# Patient Record
Sex: Female | Born: 1935 | Race: White | Hispanic: No | Marital: Married | Smoking: Never smoker
Health system: Southern US, Community
[De-identification: ages and names within clinical notes are randomized; demographics above are authoritative.]

## PROBLEM LIST (undated history)

## (undated) DIAGNOSIS — I771 Stricture of artery: Secondary | ICD-10-CM

## (undated) DIAGNOSIS — G4733 Obstructive sleep apnea (adult) (pediatric): Secondary | ICD-10-CM

## (undated) DIAGNOSIS — I2510 Atherosclerotic heart disease of native coronary artery without angina pectoris: Secondary | ICD-10-CM

## (undated) DIAGNOSIS — I472 Ventricular tachycardia: Secondary | ICD-10-CM

## (undated) DIAGNOSIS — E785 Hyperlipidemia, unspecified: Secondary | ICD-10-CM

## (undated) DIAGNOSIS — I10 Essential (primary) hypertension: Secondary | ICD-10-CM

## (undated) DIAGNOSIS — Z8679 Personal history of other diseases of the circulatory system: Secondary | ICD-10-CM

## (undated) DIAGNOSIS — Z9289 Personal history of other medical treatment: Secondary | ICD-10-CM

## (undated) DIAGNOSIS — D649 Anemia, unspecified: Secondary | ICD-10-CM

## (undated) DIAGNOSIS — I4729 Other ventricular tachycardia: Secondary | ICD-10-CM

## (undated) DIAGNOSIS — E119 Type 2 diabetes mellitus without complications: Secondary | ICD-10-CM

## (undated) HISTORY — DX: Stricture of artery: I77.1

## (undated) HISTORY — DX: Personal history of other diseases of the circulatory system: Z86.79

## (undated) HISTORY — DX: Ventricular tachycardia: I47.2

## (undated) HISTORY — DX: Obstructive sleep apnea (adult) (pediatric): G47.33

## (undated) HISTORY — DX: Anemia, unspecified: D64.9

## (undated) HISTORY — DX: Morbid (severe) obesity due to excess calories: E66.01

## (undated) HISTORY — DX: Essential (primary) hypertension: I10

## (undated) HISTORY — DX: Hyperlipidemia, unspecified: E78.5

## (undated) HISTORY — DX: Type 2 diabetes mellitus without complications: E11.9

## (undated) HISTORY — DX: Other ventricular tachycardia: I47.29

## (undated) HISTORY — DX: Personal history of other medical treatment: Z92.89

## (undated) HISTORY — DX: Atherosclerotic heart disease of native coronary artery without angina pectoris: I25.10

---

## 1998-06-22 ENCOUNTER — Encounter: Payer: Self-pay | Admitting: Emergency Medicine

## 1998-06-22 ENCOUNTER — Inpatient Hospital Stay (HOSPITAL_COMMUNITY): Admission: EM | Admit: 1998-06-22 | Discharge: 1998-06-25 | Payer: Self-pay | Admitting: Emergency Medicine

## 1998-08-01 ENCOUNTER — Inpatient Hospital Stay (HOSPITAL_COMMUNITY): Admission: AD | Admit: 1998-08-01 | Discharge: 1998-08-03 | Payer: Self-pay | Admitting: Internal Medicine

## 2000-06-11 ENCOUNTER — Ambulatory Visit (HOSPITAL_COMMUNITY): Admission: RE | Admit: 2000-06-11 | Discharge: 2000-06-11 | Payer: Self-pay | Admitting: Internal Medicine

## 2000-06-11 ENCOUNTER — Encounter: Payer: Self-pay | Admitting: Internal Medicine

## 2001-10-21 ENCOUNTER — Ambulatory Visit (HOSPITAL_COMMUNITY): Admission: RE | Admit: 2001-10-21 | Discharge: 2001-10-21 | Payer: Self-pay | Admitting: Internal Medicine

## 2001-10-21 ENCOUNTER — Encounter: Payer: Self-pay | Admitting: Internal Medicine

## 2001-12-28 ENCOUNTER — Other Ambulatory Visit: Admission: RE | Admit: 2001-12-28 | Discharge: 2001-12-28 | Payer: Self-pay | Admitting: General Surgery

## 2002-01-04 ENCOUNTER — Other Ambulatory Visit: Admission: RE | Admit: 2002-01-04 | Discharge: 2002-01-04 | Payer: Self-pay | Admitting: General Surgery

## 2002-07-29 DIAGNOSIS — Z9289 Personal history of other medical treatment: Secondary | ICD-10-CM

## 2002-07-29 HISTORY — DX: Personal history of other medical treatment: Z92.89

## 2003-01-27 HISTORY — PX: CORONARY ANGIOPLASTY: SHX604

## 2003-02-08 ENCOUNTER — Ambulatory Visit (HOSPITAL_COMMUNITY): Admission: RE | Admit: 2003-02-08 | Discharge: 2003-02-09 | Payer: Self-pay | Admitting: *Deleted

## 2003-02-08 ENCOUNTER — Encounter: Payer: Self-pay | Admitting: Cardiology

## 2006-02-07 ENCOUNTER — Ambulatory Visit (HOSPITAL_COMMUNITY): Admission: RE | Admit: 2006-02-07 | Discharge: 2006-02-07 | Payer: Self-pay | Admitting: *Deleted

## 2006-02-10 ENCOUNTER — Ambulatory Visit (HOSPITAL_COMMUNITY): Admission: RE | Admit: 2006-02-10 | Discharge: 2006-02-10 | Payer: Self-pay | Admitting: *Deleted

## 2006-03-05 ENCOUNTER — Ambulatory Visit (HOSPITAL_COMMUNITY): Admission: RE | Admit: 2006-03-05 | Discharge: 2006-03-05 | Payer: Self-pay | Admitting: *Deleted

## 2006-03-19 ENCOUNTER — Inpatient Hospital Stay (HOSPITAL_COMMUNITY): Admission: EM | Admit: 2006-03-19 | Discharge: 2006-03-26 | Payer: Self-pay | Admitting: Emergency Medicine

## 2006-03-19 ENCOUNTER — Ambulatory Visit: Payer: Self-pay | Admitting: Internal Medicine

## 2006-04-01 ENCOUNTER — Ambulatory Visit: Payer: Self-pay | Admitting: Internal Medicine

## 2006-04-01 ENCOUNTER — Inpatient Hospital Stay (HOSPITAL_COMMUNITY): Admission: AD | Admit: 2006-04-01 | Discharge: 2006-04-07 | Payer: Self-pay | Admitting: *Deleted

## 2006-04-04 ENCOUNTER — Ambulatory Visit: Payer: Self-pay | Admitting: Physical Medicine & Rehabilitation

## 2006-07-24 ENCOUNTER — Inpatient Hospital Stay (HOSPITAL_COMMUNITY): Admission: EM | Admit: 2006-07-24 | Discharge: 2006-07-31 | Payer: Self-pay | Admitting: Emergency Medicine

## 2006-07-24 ENCOUNTER — Ambulatory Visit: Payer: Self-pay | Admitting: Internal Medicine

## 2006-07-29 HISTORY — PX: COLONOSCOPY WITH ESOPHAGOGASTRODUODENOSCOPY (EGD): SHX5779

## 2006-08-13 ENCOUNTER — Ambulatory Visit: Payer: Self-pay | Admitting: Internal Medicine

## 2006-08-27 ENCOUNTER — Ambulatory Visit: Payer: Self-pay | Admitting: Internal Medicine

## 2006-08-27 ENCOUNTER — Ambulatory Visit (HOSPITAL_COMMUNITY): Admission: RE | Admit: 2006-08-27 | Discharge: 2006-08-27 | Payer: Self-pay | Admitting: Internal Medicine

## 2006-09-03 ENCOUNTER — Encounter (INDEPENDENT_AMBULATORY_CARE_PROVIDER_SITE_OTHER): Payer: Self-pay | Admitting: Specialist

## 2006-09-03 ENCOUNTER — Ambulatory Visit (HOSPITAL_COMMUNITY): Admission: RE | Admit: 2006-09-03 | Discharge: 2006-09-03 | Payer: Self-pay | Admitting: General Surgery

## 2006-09-08 ENCOUNTER — Emergency Department (HOSPITAL_COMMUNITY): Admission: EM | Admit: 2006-09-08 | Discharge: 2006-09-08 | Payer: Self-pay | Admitting: Emergency Medicine

## 2007-02-11 ENCOUNTER — Ambulatory Visit (HOSPITAL_COMMUNITY): Admission: RE | Admit: 2007-02-11 | Discharge: 2007-02-11 | Payer: Self-pay | Admitting: Pulmonary Disease

## 2007-09-09 ENCOUNTER — Inpatient Hospital Stay (HOSPITAL_COMMUNITY): Admission: EM | Admit: 2007-09-09 | Discharge: 2007-09-14 | Payer: Self-pay | Admitting: Emergency Medicine

## 2007-09-09 ENCOUNTER — Ambulatory Visit: Payer: Self-pay | Admitting: *Deleted

## 2007-09-10 ENCOUNTER — Encounter: Payer: Self-pay | Admitting: Emergency Medicine

## 2007-09-24 ENCOUNTER — Encounter: Payer: Self-pay | Admitting: Emergency Medicine

## 2007-10-02 ENCOUNTER — Ambulatory Visit (HOSPITAL_COMMUNITY): Admission: RE | Admit: 2007-10-02 | Discharge: 2007-10-02 | Payer: Self-pay | Admitting: Cardiology

## 2007-12-14 ENCOUNTER — Ambulatory Visit (HOSPITAL_COMMUNITY): Admission: RE | Admit: 2007-12-14 | Discharge: 2007-12-14 | Payer: Self-pay | Admitting: Internal Medicine

## 2007-12-15 ENCOUNTER — Ambulatory Visit (HOSPITAL_COMMUNITY): Admission: RE | Admit: 2007-12-15 | Discharge: 2007-12-15 | Payer: Self-pay | Admitting: Internal Medicine

## 2009-04-06 ENCOUNTER — Encounter: Payer: Self-pay | Admitting: Emergency Medicine

## 2010-03-13 ENCOUNTER — Encounter: Payer: Self-pay | Admitting: Emergency Medicine

## 2010-05-02 DIAGNOSIS — G4733 Obstructive sleep apnea (adult) (pediatric): Secondary | ICD-10-CM

## 2010-05-02 DIAGNOSIS — I2510 Atherosclerotic heart disease of native coronary artery without angina pectoris: Secondary | ICD-10-CM | POA: Insufficient documentation

## 2010-05-02 DIAGNOSIS — D649 Anemia, unspecified: Secondary | ICD-10-CM

## 2010-05-02 DIAGNOSIS — I472 Ventricular tachycardia: Secondary | ICD-10-CM

## 2010-05-03 ENCOUNTER — Ambulatory Visit: Payer: Self-pay | Admitting: Emergency Medicine

## 2010-05-03 DIAGNOSIS — I10 Essential (primary) hypertension: Secondary | ICD-10-CM | POA: Insufficient documentation

## 2010-05-07 LAB — CONVERTED CEMR LAB
Anti Nuclear Antibody(ANA): NEGATIVE
Rhuematoid fact SerPl-aCnc: <20 intl units/mL (ref 0–20)
Scleroderma (Scl-70) (ENA) Antibody, IgG: 15 (ref <30)
ds DNA Ab: 4 (ref <30)

## 2010-08-28 NOTE — Letter (Signed)
Summary: [facility]  [facility]   Imported By: Lester [place] 05/10/2010 10:05:43  _____________________________________________________________________  External Attachment:    Type:   Image     Comment:   External Document

## 2010-08-28 NOTE — Letter (Signed)
Summary: Sheliah Mends MD  Sheliah Mends MD   Imported By: Lester [place] 05/10/2010 10:08:06  _____________________________________________________________________  External Attachment:    Type:   Image     Comment:   External Document

## 2010-08-28 NOTE — Cardiovascular Report (Signed)
Summary: Governor Rooks MD  Governor Rooks MD   Imported By: Lester [place] 05/10/2010 09:57:54  _____________________________________________________________________  External Attachment:    Type:   Image     Comment:   External Document

## 2010-08-28 NOTE — Letter (Signed)
Summary: Julien Nordmann MD  Julien Nordmann MD   Imported By: Lester [place] 05/10/2010 10:06:56  _____________________________________________________________________  External Attachment:    Type:   Image     Comment:   External Document

## 2010-08-28 NOTE — Assessment & Plan Note (Signed)
Summary: PAH, OSA   Visit Type:  Initial Consult Copy to:  Dr. Sheliah Mends Primary Provider/Referring Provider:  Dr. Sherwood Gambler  CC:  Pulmonary Hypertension consult...she c/o increased SOB with exertion.  History of Present Illness: 75yo never smoker w morbid obesity, OSA not on CPAP, CAD s/p PTCI, HTN, DM. PAH found on R heart cath 2009, responsive to nitrates. She has chronic exertional SOB that has been stable x 3 -4 yrs. She minimizes her symptoms, but admits that she has trouble walking even through her house into the next room. No wheez, no cough, no CP.   Preventive Screening-Counseling & Management  Alcohol-Tobacco     Alcohol drinks/day: 0     Smoking Status: never  Current Medications (verified): 1)  Plavix 75 Mg Tabs (Clopidogrel Bisulfate) .... Take 1 Tablet By Mouth Once A Day 2)  Metoprolol Tartrate 25 Mg Tabs (Metoprolol Tartrate) .... Take 1/2  Tablet By Mouth Two Times A Day 3)  Multivitamins   Tabs (Multiple Vitamin) .... Take 1 Tablet By Mouth Once A Day 4)  Metformin Hcl 1000 Mg Tabs (Metformin Hcl) .... Take 1 Tablet By Mouth Once A Day 5)  Lexapro 10 Mg Tabs (Escitalopram Oxalate) .... Take 1 Tablet By Mouth Once A Day 6)  Diovan 160 Mg Tabs (Valsartan) .... Take 1 Tablet By Mouth Once A Day 7)  Isosorbide Mononitrate Cr 30 Mg Xr24h-Tab (Isosorbide Mononitrate) .... Take 1 Tablet By Mouth Once A Day 8)  Folic Acid 1 Mg Tabs (Folic Acid) .... Take 1 Tablet By Mouth Once A Day 9)  Glimepiride 4 Mg Tabs (Glimepiride) .Marland Kitchen.. 1 By Mouth Daily 10)  Nexium 40 Mg Cpdr (Esomeprazole Magnesium) .Marland Kitchen.. 1 By Mouth Daily 11)  Simvastatin 20 Mg Tabs (Simvastatin) .Marland Kitchen.. 1 By Mouth At Bedtime 12)  Ambien 10 Mg Tabs (Zolpidem Tartrate) .... 1/2 By Mouth At Bedtime 13)  Topamax 25 Mg Tabs (Topiramate) .Marland Kitchen.. 1 By Mouth Two Times A Day  Allergies (verified): 1)  ! Lisinopril 2)  ! Zetia (Ezetimibe)  Past History:  Past Medical History:  HYPERTENSION (ICD-401.9) VENTRICULAR  TACHYCARDIA (ICD-427.1) ANEMIA (ICD-285.9) SLEEP APNEA, OBSTRUCTIVE (ICD-327.23) MORBID OBESITY (ICD-278.01) PULMONARY HYPERTENSION, SEVERE (ICD-416.8) CORONARY ARTERY DISEASE (ICD-414.00) s/p PTCI  Past Surgical History: Drug-eluting stent placement to the ramus intermedius in 2004 with patent stents and no significant disease elsewhere on cardiac catheterization in 2009. 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  Patient never smoked.  Never on diet pills   Alcohol drinks/day:  0 Smoking Status:  never  Vital Signs:  Patient profile:   76 year old female Height:      64 inches (162.56 cm) Weight:      234 pounds (106.36 kg) BMI:     40.31 O2 Sat:      97 % on Room air Temp:     97.8 degrees F (36.56 degrees C) oral Pulse rate:   69 / minute BP sitting:   170 / 88  (left arm) Cuff size:   large  Vitals Entered By: Michel Bickers CMA (May 03, 2010 11:26 AM)  O2 Sat at Rest %:  94 O2 Flow:  Room air CC: Pulmonary Hypertension consult...she c/o increased SOB with exertion Comments Medications reviewed with patient Michel Bickers CMA  May 03, 2010 11:26 AM   Physical Exam  General:  normal appearance, healthy appearing, and obese.  Head:  normocephalic and atraumatic Eyes:  conjunctiva and sclera clear Nose:  no deformity, discharge, inflammation, or lesions Mouth:  no deformity or lesions Neck:  no masses, thyromegaly, or abnormal cervical nodes Lungs:  clear bilaterally to auscultation and percussion Heart:  regular rate and rhythm, S1, S2 without murmurs, rubs, gallops, or clicks Abdomen:  not examined Msk:  no deformity or scoliosis noted with normal posture Extremities:  no clubbing, cyanosis, edema, or deformity noted Neurologic:  non-focal Skin:  intact without lesions or rashes Psych:  alert and  cooperative; normal mood and affect; normal attention span and concentration   Impression & Recommendations:  Problem # 1:  PULMONARY HYPERTENSION, SEVERE (ICD-416.8) Likely due to untreated OSA, possible contribution of her systemic HTN and diastolic fxn. Also possible restrictive lung disease from obesity.  - walking oximetry today - CXR to r/o ILD - autoimmune labs - better compliance w CPAP, get new mask if possible - rov 4 mo  Orders: Consultation Level IV (82956) T-Antinuclear Antib (ANA) [phone]) T-Rheumatoid Factor [phone]) T- * Misc. Laboratory test [phone]) T-2 View CXR ([id number]) DME Referral (DME)  Medications Added to Medication List This Visit: 1)  Metoprolol Tartrate 25 Mg Tabs (Metoprolol tartrate) .... Take 1/2  tablet by mouth two times a day 2)  Glimepiride 4 Mg Tabs (Glimepiride) .Marland Kitchen.. 1 by mouth daily 3)  Nexium 40 Mg Cpdr (Esomeprazole magnesium) .Marland Kitchen.. 1 by mouth daily 4)  Simvastatin 20 Mg Tabs (Simvastatin) .Marland Kitchen.. 1 by mouth at bedtime 5)  Ambien 10 Mg Tabs (Zolpidem tartrate) .... 1/2 by mouth at bedtime 6)  Topamax 25 Mg Tabs (Topiramate) .Marland Kitchen.. 1 by mouth two times a day  Patient Instructions: 1)  Continue your blood pressure medications as you are taking them.  2)  Walking oximetry today shows you do not need oxygen when you exert yourself 3)  Try to wear your CPAP every night. We will ask your company to help find a mask that fits better 4)  Blood work today 5)  CXR today 6)  Follow up with Dr Delton Coombes in 4 months. We will discuss your CPAP usage and whether we may be able to benefit you with medications for pulmonary hypertension.   Appended Document: PAH, OSA-walking sat. results    Clinical Lists Changes                  Ambulatory Pulse Oximetry  Resting; HR_80____    02 Sat_96%RA____  Lap1 (185 feet)   HR__106___   02 Sat_91%RA____ Lap2 (185 feet)   HR_113____   02 Sat__92%RA___    Lap3 (185 feet)   HR__112___   02  Sat_92%RA____  __X_Test Completed without Difficulty ___Test Stopped due to: Elray Buba RN  May 03, 2010 12:18 PM

## 2010-09-11 ENCOUNTER — Encounter: Payer: Self-pay | Admitting: Emergency Medicine

## 2010-09-11 ENCOUNTER — Ambulatory Visit (INDEPENDENT_AMBULATORY_CARE_PROVIDER_SITE_OTHER): Payer: Medicare Other | Admitting: Emergency Medicine

## 2010-09-11 DIAGNOSIS — I2789 Other specified pulmonary heart diseases: Secondary | ICD-10-CM

## 2010-09-11 DIAGNOSIS — G4733 Obstructive sleep apnea (adult) (pediatric): Secondary | ICD-10-CM

## 2010-09-19 NOTE — Assessment & Plan Note (Signed)
Summary: PAH   Visit Type:  Follow-up Copy to:  Dr. Sheliah Mends Primary Provider/Referring Provider:  Dr. Sherwood Gambler  CC:  f/u-4 mth., sob same, no cough or wheeze, not using CPAP, and CHF Management.  History of Present Illness: 75yo never smoker w morbid obesity, OSA not on CPAP, CAD s/p PTCI, HTN, DM. PAH found on R heart cath 2009, responsive to nitrates. She has chronic exertional SOB that has been stable x 3 -4 yrs. She minimizes her symptoms, but admits that she has trouble walking even through her house into the next room. No wheez, no cough, no CP.   ROV 09/11/10 -- follows up for OSA, obesity, secondary PAH. NO desat woth exertion last visit. SOB is the same, no changes. She tried CPAP for about 2 weeks, can't tolerate it. Doesn't want to pursue, wants to continue with her current meds and plans.    Preventive Screening-Counseling & Management  Alcohol-Tobacco     Smoking Status: never  Current Medications (verified): 1)  Plavix 75 Mg Tabs (Clopidogrel Bisulfate) .... Take 1 Tablet By Mouth Once A Day 2)  Metoprolol Tartrate 25 Mg Tabs (Metoprolol Tartrate) .... Take 1/2  Tablet By Mouth Two Times A Day 3)  Multivitamins   Tabs (Multiple Vitamin) .... Take 1 Tablet By Mouth Once A Day 4)  Metformin Hcl 1000 Mg Tabs (Metformin Hcl) .... Take 1 Tablet By Mouth Once A Day 5)  Lexapro 10 Mg Tabs (Escitalopram Oxalate) .... Take 1 Tablet By Mouth Once A Day 6)  Diovan 160 Mg Tabs (Valsartan) .... Take 1 Tablet By Mouth Once A Day 7)  Isosorbide Mononitrate Cr 30 Mg Xr24h-Tab (Isosorbide Mononitrate) .... Take 1 Tablet By Mouth Once A Day 8)  Folic Acid 1 Mg Tabs (Folic Acid) .... Take 1 Tablet By Mouth Once A Day 9)  Glimepiride 4 Mg Tabs (Glimepiride) .Marland Kitchen.. 1 By Mouth Daily 10)  Nexium 40 Mg Cpdr (Esomeprazole Magnesium) .Marland Kitchen.. 1 By Mouth Daily 11)  Simvastatin 20 Mg Tabs (Simvastatin) .Marland Kitchen.. 1 By Mouth At Bedtime 12)  Ambien 10 Mg Tabs (Zolpidem Tartrate) .... 1/2 By Mouth At  Bedtime 13)  Topamax 25 Mg Tabs (Topiramate) .Marland Kitchen.. 1 By Mouth Two Times A Day  Allergies (verified): 1)  ! Lisinopril 2)  ! 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  Patient never smoked.  Never on diet pills   Risk Factors: Smoking Status: never (09/11/2010)  Vital Signs:  Patient profile:   76 year old female Height:      64 inches Weight:      234.31 pounds O2 Sat:      97 % on Room air Temp:     97.9 degrees F oral Pulse rate:   72 / minute BP sitting:   152 / 92  (left arm) Cuff size:   large  Vitals Entered By: Elray Buba RN (September 11, 2010 11:41 AM)  O2 Flow:  Room air CC: f/u-4 mth., sob same, no cough or wheeze, not using CPAP, CHF Management Is Patient Diabetic? Yes Comments Medications reviewed with patient ,phone # verified. Elray Buba RN  September 11, 2010 11:46 AM    Physical Exam  General:  normal appearance, healthy appearing, and obese.   Head:  normocephalic and atraumatic Eyes:  conjunctiva and sclera clear Nose:  no deformity, discharge, inflammation, or lesions Mouth:  no deformity or lesions Neck:  no masses, thyromegaly, or abnormal cervical nodes Lungs:  clear bilaterally to auscultation and percussion Heart:  regular rate and rhythm, S1, S2 without murmurs, rubs, gallops, or clicks Abdomen:  not examined Msk:  no deformity or scoliosis noted with normal posture Extremities:  no clubbing, cyanosis, edema, or deformity noted Neurologic:  non-focal Skin:  intact without lesions or rashes Psych:  alert and cooperative; normal mood and affect; normal attention span and concentration   Impression & Recommendations:  Problem # 1:  PULMONARY HYPERTENSION, SEVERE (ICD-416.8)  Multifactorial. The most modifiable are her systemic BP and her OSA. She  cannot tolerate CPAP, not interested in pursuing further.  - defer furher eval for now, she can refer back if she worsens or changes her mind about CPAP  Orders: Est. Patient Level IV (19147)  Problem # 2:  MORBID OBESITY (ICD-278.01)  Problem # 3:  SLEEP APNEA, OBSTRUCTIVE (ICD-327.23)  Orders: Est. Patient Level IV (82956)  Problem # 4:  HYPERTENSION (ICD-401.9)  Her updated medication list for this problem includes:    Metoprolol Tartrate 25 Mg Tabs (Metoprolol tartrate) .Marland Kitchen... Take 1/2  tablet by mouth two times a day    Diovan 160 Mg Tabs (Valsartan) .Marland Kitchen... Take 1 tablet by mouth once a day  CHF Assessment/Plan:      The patient's current weight is 234.31 pounds.  Her previous weight was 234 pounds.    Patient Instructions: 1)  We will defer any further CPAP for now 2)  Please continue your medications as ordered.  3)  Follow up with Dr Delton Coombes as needed    Immunization History:  Influenza Immunization History:    Influenza:  historical (04/28/2010)  Pneumovax Immunization History:    Pneumovax:  historical (04/28/2006)

## 2010-12-11 NOTE — Discharge Summary (Signed)
[name] Mueller, [name] Mueller                ACCOUNT NO.:  [id number]   MEDICAL RECORD NO.:  [id number]          PATIENT TYPE:  INP   LOCATION:  3703                         FACILITY:  [facility]   PHYSICIAN:  Manning Charity, MD     DATE OF BIRTH:  1934-12-20   DATE OF ADMISSION:  09/09/2007  DATE OF DISCHARGE:  09/14/2007                               DISCHARGE SUMMARY   CARDIOLOGY DOCTOR:  Dr. Jacinto Halim and Dr. Sherwood Gambler.   CONSULTANTS:  Dr. Alanda Amass.   DISCHARGE DIAGNOSES:  1. Diastolic dysfunction.  2. Pulmonary hypertension.  3. Diabetes type 2.  4. Hypertension.  5. Coronary artery disease, status post stent placement in 2004 and      balloon angioplasty to the left circumflex.  6. Depression.  7. History of nonsustained ventricular tachycardia.  8. History of anemia with an endoscopy and colonoscopy in 2008 that      showed small hiatal hernia, hemorrhoids, and colonic diverticula.   DISCHARGE MEDICATIONS:  1. Irbesartan 150 mg daily.  2. Lexapro 10 mg daily.  3. Zocor 20 mg at night.  4. Plavix 75 mg daily.  5. Isosorbide mononitrate 30 mg daily.  6. Aspirin 81 mg daily.  7. Lasix 20 mg daily.  8. Potassium chloride 10 mEq daily.  9. Lopressor 25 mg twice a day.  10.Nitroglycerin 0.4 mg sublingual every 5 minutes for chest pain, if      needed.  11.Percocet 5/325 mg 1 tablet every 4 hours if needed.  12.Metformin 1000 mg twice daily.  13.Glimepiride 4 mg daily.  14.Nexium 40 mg daily.  15.Oxygen 2 liters for one week.   DISPOSITION AND FOLLOW UP:  [name] Mueller has an appointment with Dr.  Jacinto Halim, primary care physician, on September 16, 2007, at 11 a.m., with  Dr. Sherwood Gambler on September 17, 2007, at 10:45 a.m.  She needs to be followed  up on her improvement of shortness of breath to decide to discontinue  oxygen.  Her hemoglobin needs to be followed also because of her anemia.  consider colonoscopy outpatient.   PROCEDURES PERFORMED:  1. Cardiac catheterization that showed the  left main coronary artery      was normal, LAD with 30% narrowing, also showed severe pulmonary      hypertension.  2. CT of the chest that was negative for pulmonary embolism.  Ground-      glass opacity in both lungs suggesting mild edema or bronchiolitis.      There are small bilateral pleural effusions with mild bibasilar      atelectasis.  Mediastinal and hilar adenopathy is only mildly      progressive over the 35-month interval, supportive of chronic      inflammatory process as the etiology.  A low grade neoplasm is not      entirely excluded.  This needs to be followed up in 3 months.   HISTORY OF PRESENT ILLNESS:  [name] Mueller is a 76 year old female with a  past medical history of coronary artery disease, status post stent  placement, diabetes, pulmonary hypertension, presenting with 2-3 day  history of  worsening exertional dyspnea.  During this same period, she  has been having intermittent suprasternal left-sided chest pain,  nonradiating, and not associated with nausea, vomiting, or diaphoresis.  She denies cough, fever, chills, travel history or sick contacts.  She  has been taking all of her home medications.  She also notes lower  extremity weakness plus swelling and pain in her ankles and feet.   VITAL SIGNS:  Temperature 97.6.  Blood pressure 194/88.  Pulse 80.  Respirations 18.  Oxygen saturation 95% on 2 liters.   PHYSICAL EXAMINATION:  GENERAL:  She was alert, speaking 5 and 6 words  at a time.  RESPIRATORY:  She had bilateral crackles all over.  Positive inspiratory  and expiratory wheezing.  Not tachypneic.  CARDIOVASCULAR:  Regular rhythm and rate.  No murmurs, no rubs, or  gallops.  GASTROINTESTINAL:  Bowel sounds positive.  Obese, soft, nontender, not  distended.  EXTREMITIES:  +2 pitting edema.   LABORATORY DATA:  Sodium 139, potassium 4.0, chloride 105, bicarbonate  26, BUN 13, creatinine 1.0, glucose 236.  Hemoglobin 10.0, white blood  cells 9.1,  platelets 344, ANC 6.9, MCV 99.  Calcium 9.0.  BNP 105.  CK-  MB 0.8, troponin 0.1.  EKG with normal sinus rhythm, rate of 73.  Chest  x-ray showed right lower lobe pneumonia, bilateral effusion,  left more  than right.   HOSPITAL COURSE:  Problem 1:  Dyspnea.  [name] Mueller was admitted to the  telemetry floor.  We were considering her shortness of breath was  secondary to CHF exacerbation, diastolic dysfunction, and secondary to  acute coronary syndrome.  Catheterization was performed and this was  negative for acute coronary syndrome.  It shows pulmonary hypertension.  Also, pulmonary embolism was ruled out.  A CT scan was negative.  On the  CT of the chest, there is a ground glass opacity.  This can be secondary  to amiodarone use so during the hospitalization, amiodarone was  discontinued.  Pulmonary function tests were obtained which showed lung  volumes showing a mild reduction in total lung capacity.  Spirometry  showed no definite acute ventilatory defect and no definite airflow  obstruction.  The pulmonary function test was consistent with a  restrictive change.  During the hospitalization, after appropriate  diuresis with Lasix 20 mg IV and with the use of bronchodilator, the  dyspnea improved.  The patient was started on Isosorbide.  The patient  was discharged home with oxygen because of walking oxygen saturation of  88%.  This needs to be evaluated on an outpatient basis.   Problem 2:  Anemia.  Iron 61, ferritin 26, folate 20.  During the  hospitalization, hemoglobin dropped to 7.8 post-catheterization.  The  site of the cutdown was clear with no sign of hematoma.  She received 2  units of packed red blood cells.  After transfusion, the hemoglobin was  10 and decreased to 9 and it was stable.  B12 was 564.  She might  benefit from ferrous sulfate supplement.   Problem 3:  Hypertension.  The hypertension was better controlled during  hospitalization.  On the day of discharge  the blood pressure was 137/84.  She was discharged on Lopressor and Irbesartan.   DISCHARGE LABORATORY AND VITALS:  On the day of discharge, the patient  was stable with temperature 98.5, blood pressure 137/84, pulse 68,  oxygen saturation 95% on 2 liters.  Hemoglobin 9.0, hematocrit 27, white  blood cells 7.9.  Sodium  140, potassium 4.0, chloride 106, bicarbonate  27, BUN 10, creatinine 1.25, platelets 164.      [name] Barefoot, MD  Electronically Signed      Manning Charity, MD  Electronically Signed    BR/MEDQ  D:  09/15/2007  T:  09/16/2007  Job:  [id number]   cc:   Cristy Hilts. Jacinto Halim, MD  Madelin Rear. Sherwood Gambler, MD

## 2010-12-11 NOTE — Procedures (Signed)
[name] Mueller, [name] Mueller                ACCOUNT NO.:  [id number]   MEDICAL RECORD NO.:  [id number]          PATIENT TYPE:  OUT   LOCATION:  RESP                          FACILITY:  [facility]   PHYSICIAN:  Edward L. Juanetta Gosling, M.D.DATE OF BIRTH:  07-Oct-1934   DATE OF PROCEDURE:  02/11/2007  DATE OF DISCHARGE:                            PULMONARY FUNCTION TEST   1. Spirometry shows no definite ventilatory defect and no definite      airflow obstruction.  2. Lung volumes show mild reduction in total lung capacity.  3. DLCO is moderately reduced.  This is consistent with a restrictive      change.      Edward L. Juanetta Gosling, M.D.  Electronically Signed     [facility]/MEDQ  D:  02/11/2007  T:  02/12/2007  Job:  [id number]   cc:   Madelin Rear. Sherwood Gambler, MD  Fax: [phone]

## 2010-12-11 NOTE — Cardiovascular Report (Signed)
[name] Mueller, [name] Mueller                ACCOUNT NO.:  [id number]   MEDICAL RECORD NO.:  [id number]          PATIENT TYPE:  INP   LOCATION:  3703                         FACILITY:  [facility]   PHYSICIAN:  Richard A. Alanda Amass, M.D.DATE OF BIRTH:  17-Nov-1934   DATE OF PROCEDURE:  09/10/2007  DATE OF DISCHARGE:                            CARDIAC CATHETERIZATION   PROCEDURES:  1. Retrograde central aortic catheterization.  2. Right heart catheterization.  3. Retrograde left heart catheterization.  4. Thermodilution cardiac output.  5. Simultaneous arterial-venous oxygen content difference.  6. Estimated Fick cardiac output.  7. Selective coronary angiography pre and post intracoronary      administration.  8. Left ventricular angiogram, RAO, LAO projection.  9. Abdominal aortic angiogram, midstream PA projection.  10.Sublingual nitroglycerin given 0.4 mg x2 with labetalol IV 20 mg      with monitoring of pulmonary artery pressures for responsiveness.   PROCEDURE:  The patient was taken to the CP lab in a postabsorptive  state after 5 mg Valium p.o. premedication.  The right groin was  prepped, draped in the usual manner 1% lidocaine was used for local  anesthesia.  Because of severe morbid obesity and a large abdominal  pannus, arterial pulses were not well-felt.  A Smart needle was used to  locate the common RFV and common RFA.  A 7-French sheath was placed in  the right femoral vein and a 6-French sheath in the right femoral  artery.  Right heart catheterization was done with thermodilution Ernestine Conrad-  Ganz catheter and a Swan wire.  We were able to get the Swan-Ganz  catheter into PA but were not able to get a wedge position because of  the patient's severe pulmonary hypertension.  Thermodilution cardiac out  and simultaneous A/V O2 difference were done.  Selective coronary  angiography was done with the left coronary pre and post IC  nitroglycerin administration and then right coronary with  6-French 4-cm  taper coronary catheters.  LV angiogram was done in the RAO and LAO  projection at 25 mL, 14 mL/sec. And 20 mL, 12 mL/sec., respectively.  Pullback pressure to the CA was performed that showed no gradient.  Abdominal aortic angiogram because of the patient's severe hypertension  was done the midstream PA projection.  This demonstrated a patent celiac  and SMA axis, large bilateral patent renal arteries and no significant  infrarenal abdominal aortic disease with a smooth abdominal aorta and  patent proximal iliacs and intact hypogastrics bilaterally.   The patient was given total of 75 mg of fentanyl for sedation in the lab  in divided doses.   With pulmonary pressure with the Swan-Ganz intact in the pulmonary  artery, we then gave the patient total of 800 __________ of sublingual  nitroglycerin and 20 mg of labetalol IV.   This brought systolic pressures down from 200-210 to 160/87.   Pulmonary artery pressures were 85-90/30 (mean of 54) and came down to  61/21 (mean of 38).   Following labetalol and IV nitroglycerin administration, the catheter  was removed, side-arm sheath was flushed and the patient was brought  to  the holding area for sheath removal and pressure hemostasis.  During the  procedure blood was drawn for anemia indices because of the patient's  preoperative and admission anemia.  H&H on admission was 10.0/29.8.  The  patient also had hypokalemia, which has been replenished.   She had a negative V/Q scan with low probability for pulmonary emboli on  admission for progressive dyspnea over the last several days.   This patient has a complex medical history.  She has morbid obesity,  normal systolic function, hyperlipidemia, systemic hypertension, sleep  apnea on CPAP, and has very strong emotional component overlying this.  She has had prior the left optional diagonal (__________ ), DES stenting  July 2004, and had widely patent stent on last  catheterization of July  2007 with no other significant coronary disease and normal LV function.  She did have right heart catheterization on restudy February 10, 2006, when  she had widely patent Taxus stent and at that time PCW was elevated mean  to almost 25-30.  PA pressure was 47/22 with a mean at 33 with systolic  systemic blood pressure of 160/80.   She was admitted now with progressive dyspnea over several days'  duration with atypical, nonischemic-sounding chest pain.  She has also  had a tremor, is on chronic amiodarone therapy.  She has had prior  history of nonsustained VT in 2004 and despite normal LV function, EP  consultation at that time recommended amiodarone therapy empirically,  which she has been on long-term.  She has also had some episodic  orthostatic hypotension treated with midodrine and Mestinon Mestinon was  discontinued because of tremors and she off midodrine at the present.   Prior CT showed some possible pulmonary adenopathy and they recommended  follow-up with no evidence of pulmonary emboli in 2007.  She was to have  an outpatient Cardiolite and follow, but I am not sure if this was  accomplished   As mentioned above, she does have an emotional component to a lot of her  symptoms.  She denies any GERD or upper or lower GI bleeding or dark or  tarry stools.   PRESSURES:  LV 200/88.  LVEDP 24 mmHg.   CA 200/90 mmHg.   No gradient across the aortic valve on catheter pullback.   PA:  90/85/30; mean 54 mmHg.   Post SL nitroglycerin and 20 mg of labetalol for blood pressure  reduction, PA pressure fell to 61/21 with a mean of 38 and systemic  pressure fell to 160/67.   RV at that time was 60/5 with RVEDP 15.   Thermodilution CO/CI equaled 5.8/2.72.  Aortic saturation was low at 84  and PA saturation 48 on 3 L/min.  Estimated Fick CO/CI is 5.84/2.72.   Fluoroscopy:  Fluoroscopy did not reveal any coronary or significant  intracardiac calcification.    LV angiogram in the RAO and LAO projection showed a normally and  vigorously-contracting LV.  There was no mitral regurgitation.  Angiographic LVH was present.  EF was greater than 60%.   The main left coronary was normal.   The LAD had smooth 30% narrowing in the proximal third.  Eccentric 30%  narrowing at the junction of the proximal mid third between the small to  moderate-sized DX-1 and small DX-2 and the remainder of vessels widely  patent and normal, coursing the apex of the heart where it bifurcated.   There was a large optional diagonal (ramus branch) that had no stenosis  within the previously-placed stent in 2004 and excellent large  bifurcating branches distally.   The circumflex ostial portion had 40-50% narrowing on some views but  excellent flow and good residual lumen.  There was a small OM-2 and OM-3  that was normal and a PAVG branch that was normal.   The right coronary was a large, dominant vessel that was widely patent  and smooth throughout with no evidence of narrowing or stenosis and  normal RV branch from the midportion and normal PDA and PLA.   DISCUSSION:  The patient's history is as outlined above.  She appears to  have severe systemic hypertension and is responsive to medication and  severe pulmonary hypertension that is also responsive to nitrates and  lowering of systemic blood pressure in the lab.  Whether there is a  fixed component of pulmonary hypertension is hard to know.   She has been on chronic amiodarone and we are also not sure whether she  has a component of amiodarone-induced pulmonary toxicity.  She has  previously placed on amiodarone nonsustained VT and I would recommend  that we discontinue amiodarone now in view of her normal LV function and  low risk of FCD and this setting with normal coronary arteries.  We will  go ahead and get PFTs along with diffusion capacity.   At present I would recommend therapy with vasodilators (calcium   blocker), either Norvasc or Procardia XL, ACE or ARB with nitrate  therapy.  She may need associated diuretics.  Her dyspnea is probably  related to diastolic dysfunction and a combination of pulmonary  hypertension.   I would recommend follow-up 2-D echo.  Based on her clinical response  and pulmonary pressures on 2-D echo, she may be a candidate sildenafil  therapy in the future.   The reason for her anemia is unclear.  It is probably GI-related but the  laboratory is currently pending and Hemoccults will be pending as well.  Depending upon these results, she may need a GI consultation and  endoscopic evaluation.   She will empirically be put on PPIs now.  Depending upon her post-  catheterization laboratory she may require transfusion, and blood is  drawn for type and hold.   CATHETERIZATION DIAGNOSES:  1. ASHD, status post ramus (obtuse marginal #1) drug-eluting stenting      July 2004, no restenosis at last catheterization and on current      angiography.  2. Normal left ventricular function, left ventricular hypertrophy.  3. Severe systemic hypertension, labile, normal renal arteries.  4. Severe pulmonary hypertension responsive to nitrite therapy in the      laboratory and lowering of blood pressure.  5. Sleep apnea on continuous positive airway pressure.  6. Morbid obesity.  7. History of nonsustained ventricular tachycardia in the past on      chronic amiodarone therapy, rule out amiodarone pulmonary toxicity.  8. Hyperlipidemia.  9. Noncritical ostial circumflex and left anterior descending artery      disease.  10.Diastolic dysfunction secondary to hypertension and chronic      obstructive pulmonary disease probably contributing to pulmonary      hypertension, which is labile.  11.No evidence of pulmonary emboli on V/Q on admission.  Follow-up      chest CT pending for follow-up of past scan in 2007 is recommended      by radiology and assessment of pulmonary artery  size and rule out      pulmonary embolus.  12.Tremor, chronic,  recent exacerbation.  13.Anemia, etiology to be determined.      Richard A. Alanda Amass, M.D.  Electronically Signed     RAW/MEDQ  D:  09/10/2007  T:  09/12/2007  Job:  [id number]   cc:   Dani Gobble, MD  Madelin Rear. Sherwood Gambler, MD  CP Lab  [facility]  [place] Office

## 2010-12-14 NOTE — Cardiovascular Report (Signed)
NAMEJALEEYAH, MUNCE                ACCOUNT NO.:  [id number]   MEDICAL RECORD NO.:  [id number]          PATIENT TYPE:  OIB   LOCATION:  2899                         FACILITY:  [facility]   PHYSICIAN:  Cristy Hilts. Jacinto Halim, MD       DATE OF BIRTH:  [birth year]   DATE OF PROCEDURE:  02/10/2006  DATE OF DISCHARGE:                              CARDIAC CATHETERIZATION   ATTENDING CARDIOLOGIST:  Dr. Kem Boroughs.   REFERRING PHYSICIAN:  Wynona Canes, M.D.   PROCEDURE PERFORMED:  1. Right and left heart catheterization including hemodynamic monitoring      of right heart pressures and calculation of cardiac output and cardiac      index by FICK.  2. Left heart catheterization including left ventriculography, selective      right and left coronary angiography, abdominal aortogram.   INDICATION:  Ms. Laylani Pudwill is a 76 year old female with a history of  diabetes, hypertension, hyperlipidemia, family history of premature coronary  artery disease.  She has abnormal Cardiolite stress test previously and she  had again presented with multiple somatic complaints.  It was decided to  proceed with either stress testing vs. direct cardiac catheterization and as  per patient preference she was brought to the catheterization lab to  evaluate her coronary anatomy to look for restenosis and progression of  coronary disease.  She has had an implantation of a 3 x 20 mm TAXUS stent  implantation in mid ramus on February 08, 2003.  She has had prior inferolateral  myocardial infarction.  Right heart catheterization was performed to  evaluate for pulmonary hypertension, she also has shortness of breath and  morbid obesity.  Abdominal aortogram was performed to rule out for renal  artery stenosis as she has hypertension.   HEMODYNAMIC DATA:  Right heart catheterization.  RA pressure were 17/15 with  a mean of 11 mmHg.  There was prominent V-waves noted in the RA tracings  suggestive of significant tricuspid  regurgitation.   RV pressures 49/3 with an end-diastolic pressure of 13 mmHg.   Pulmonary capillary wedge 33/33 with a mean of 33 mmHg.   PA saturation was 62% and aortic saturation was 94%.   Cardiac output was 4.6 with a cardiac index of 2.1.   Systemic vascular resistance was elevated at 186.   LEFT HEART CATHETERIZATION HEMODYNAMIC DATA:  The left ventricular pressure  was 156/9 with an end-diastolic pressure of 9 mmHg.  The aortic pressures  were 160/60 with a mean of 97 mmHg.  There was no pressure gradient across  the aortic valve.   ANGIOGRAPHIC DATA:  Left ventricle.  Left ventricular systolic function was  preserved with an ejection fraction of 55%.  There is mild mid anterolateral  wall hypokinesis and lateral wall hypokinesis.  There was no significant  mitral regurgitation.   Right coronary artery.  Right coronary artery is a large caliber vessel and  a dominant vessel.  It gives rise into a large PDA.  It is smooth and  normal.   Circumflex.  Circumflex is a large caliber vessel which gives rise  into a  large obtuse marginal 1.  It is smooth and normal.   Ramus intermedius.  Ramus intermedius is a large caliber vessel.  The  previously placed TAXUS stent in the mid ramus branch is widely patent.   LAD.  The LAD is a large caliber vessel.  It gives rise into a  large  diagonal.  It is smooth to normal.   IMPRESSION:  1. Normal left ventricular systolic function with mild mid anterolateral      and lateral wall hypokinesis.  No significant mitral regurgitation.      Ejection fraction of 55%.  2. Moderate pulmonary hypertension with PA pressures measuring 47 mmHg, PA      systolic pressure measuring 47 mmHg.  3. Significant V-wave in RA pressure tracing suggestive of significant      tricuspid regurgitation; again, secondary to probably pulmonary      hypertension and obesity.  4. Elevated left atrial pressure suggestive of diastolic dysfunction and       diastolic heart failure.  The systemic resistance is also elevated,      suggestive of again chronic diastolic heart failure.   RECOMMENDATION:  1. Weight loss exercise program as indicated.  Suspect pulmonary      hypertension is secondary to morbid obesity.  2. There is no evidence of aortic stenosis by either right heart      catheterization or left heart catheterization by pressure gradient      criteria.  3. The renal arteries are widely patent.   A total of 115 mL of contrast was utilized for diagnostic angiography.   TECHNIQUE OF PROCEDURE:  Under the usual sterile precautions, using a 6  French right femoral artery access and a 7 French right femoral venous  access, left and right heart catheterization was performed.  Next, right and  left heart catheterization using a 6 [place] multipurpose B2 catheter, the  catheter was advanced in the ascending aorta with a 0.025 J wire.  The  catheter was gently advanced into the left ventricle.  Left ventricular  pressures were monitored.  Hand contrast injection of the left ventricle was  performed both in the LAO and RAO projections.  The catheter was flushed  with saline, pulled back into the ascending aorta and pressure gradient  across the aortic valve was monitored.  The right coronary artery was  selectively engaged and angiography was performed.  Then the catheter was  pulled back into the abdominal aorta and abdominal aortogram was performed.  A 6 French Judkins left 4 diagnostic catheter was utilized to engage left  main coronary artery in the usual fashion and angiography and angiography  was performed.  The the catheter was pulled out of the body in the usual  fashion.   TECHNIQUE OF RIGHT HEART CATHETERIZATION:  Using a balloon tip Swan Ganz  catheter, the catheter was easily advanced in the pulmonary artery.  Pulmonary artery wedge pressure was easily obtained.  PA pressures and right heart pressures were carefully monitored  and evaluated.  PA  saturation was also obtained.  The catheter was the pulled out of the body  in the usual fashion.  The patient tolerated the procedure well.  No  immediate complications noted.      Cristy Hilts. Jacinto Halim, MD  Electronically Signed     JRG/MEDQ  D:  02/10/2006  T:  02/10/2006  Job:  [id number]   cc:   Wynona Canes, M.D.  Dani Gobble, MD

## 2010-12-14 NOTE — Op Note (Signed)
[name] Mueller, [name] Mueller                ACCOUNT NO.:  [id number]   MEDICAL RECORD NO.:  [id number]          PATIENT TYPE:  AMB   LOCATION:  DAY                           FACILITY:  [facility]   PHYSICIAN:  Dalia Heading, M.D.  DATE OF BIRTH:  05-03-1935   DATE OF PROCEDURE:  09/03/2006  DATE OF DISCHARGE:                               OPERATIVE REPORT   PREOPERATIVE DIAGNOSES:  1. Cholecystitis.  2. Cholelithiasis.   POSTOPERATIVE DIAGNOSES:  1. Cholecystitis.  2. Cholelithiasis.   PROCEDURE:  Laparoscopic cholecystectomy.   SURGEON:  Franky Macho, MD   ANESTHESIA:  General endotracheal.   INDICATIONS:  The patient is a 76 year old white female who was referred  for evaluation and treatment of biliary colic secondary to  cholelithiasis.  The risks and benefits of the procedure including  bleeding, infection, hepatobiliary injury, and the possibility of an  open procedure were fully explained to the patient, who gave informed  consent.   PROCEDURE NOTE:  The patient was placed in the supine position.  After  induction of general endotracheal anesthesia, the abdomen was prepped  and draped using the usual sterile technique with Betadine.  Surgical  site confirmation was performed.   A supraumbilical incision was made down to the fascia.  A Veress needle  was introduced into the abdominal cavity and confirmation of placement  was done using the saline drop test.  The abdomen was then insufflated  to 16 mmHg pressure.  A 11-mm trocar was introduced into the abdominal  cavity under direct visualization without difficulty.  The patient was  placed in reversed Trendelenburg position and an additional 11-mm trocar  was placed in the epigastric region and 5-mm trocars were placed in the  right upper quadrant and right flank regions.  The liver was inspected  and noted to be within normal limits.  The gallbladder was retracted  superior and laterally.  The dissection was begun around the  infundibulum of the gallbladder.  The cystic duct was first identified.  Its juncture to the infundibulum was fully identified.  Endoclips were  placed proximally and distally on the cystic duct and cystic duct was  divided and this was likewise done to the cystic artery.  The  gallbladder was then freed away from the gallbladder fossa using Bovie  electrocautery.  The gallbladder was delivered through the epigastric  trocar site using an EndoCatch bag.  The gallbladder fossa was inspected  and no abnormal bleeding or bile leakage was noted.  Surgicel was placed  in the gallbladder fossa.  All fluid and air were then evacuated from  the abdominal cavity prior to removal of the trocars.   All wounds were irrigated with normal saline.  All wounds were injected  with 0.5% Sensorcaine.  The epigastric fascia was reapproximated using  an 0 Vicryl interrupted suture.  All skin incisions were closed using  staples.  Betadine ointment and dry sterile dressings were applied.   All tape and needle counts were correct at the end of the procedure.  The patient was extubated in the operating room and went back  to  recovery room, awake and in stable condition.   COMPLICATIONS:  None.   SPECIMEN:  Gallbladder.   BLOOD LOSS:  Minimal.      Dalia Heading, M.D.  Electronically Signed     MAJ/MEDQ  D:  09/03/2006  T:  09/03/2006  Job:  [id number]   cc:   Madelin Rear. Sherwood Gambler, MD  Fax: [phone]   Dani Gobble, MD  Fax: [phone]

## 2010-12-14 NOTE — Cardiovascular Report (Signed)
NAME:  SELIA, WAREING                          ACCOUNT NO.:  [id number]   MEDICAL RECORD NO.:  [id number]                   PATIENT TYPE:  OIB   LOCATION:  6531                                 FACILITY:  [facility]   PHYSICIAN:  Vonna Kotyk R. Jacinto Halim, M.D.                  DATE OF BIRTH:  08-22-1934   DATE OF PROCEDURE:  02/08/2003  DATE OF DISCHARGE:  02/09/2003                              CARDIAC CATHETERIZATION   PROCEDURE PERFORMED:  1. Left ventriculography.  2. Selective right and left coronary arteriography.  3. Intravascular ultrasound interrogation of ramus intermediate.  4. Cutting balloon angioplasty of the ramus branch followed by stenting.  5. IVUS interrogation of the circumflex coronary artery followed by cutting     balloon angioplasty of the ostium of the circumflex coronary artery.  6. Intracoronary nitroglycerin administration and utilization of heparin and     Integrelin for anticoagulation.   INDICATION:  Ms. Shaundra Fullam is a patient with known history of non-Q-wave  myocardial infarction on June 23, 1998 who had undergone percutaneous  transluminal coronary angioplasty with balloon angioplasty of the ramus  intermediate followed by restenosis followed by balloon angioplasty on  August 02, 1998 for balloon angioplasty restenotic site of the ramus  intermediate ostium who presents with recurrent chest pain.  She also has  diabetes, hypertension, hyperlipidemia.  Given this, she underwent a  Cardiolite stress test which was abnormal.  Hence, she was brought back to  the cardiac catheterization to re-evaluate her  coronary anatomy.   HEMODYNAMIC DATA:  1. The left ventricular pressures were 181/15 with end-diastolic pressure of     18 mmHg.  2. Aortic pressure 147/74 with a mean of 104 mmHg.  3. There was no pressure gradient across the aortic valve.   ANGIOGRAPHIC DATA:  Left ventricle:  Left ventricular systolic function was  mildly depressed with inferolateral  wall hypokinesis.   Right coronary artery:  The right coronary artery is a large caliber vessel.  It has mild luminal irregularity.   Left main coronary artery:  Left main coronary artery is a large caliber  vessel. It trifurcates into a large LAD, large size ramus and a moderate  size circumflex.   Circumflex coronary artery:  Circumflex coronary artery is a moderate  caliber vessel and has ostial 80% angiographic stenosis. IVUS revealed the  ostial lesion to be packed with plaque __________ the whole IVUS catheter.   Ramus intermediate:  Ramus intermediate is a large caliber vessel.  The  ostium has mild haziness with 30-40% luminal narrowing.  The mid segment had  about 50% luminal narrowing which was new compared from previous cardiac  catheterization.  IVUS interrogation of ramus revealed the ostium to be  widely patent with mild plaque burden.  However, the mid segment had about  70-80% narrowing with vessel lumen area of 2.4 mm.   Left anterior descending artery:  Left anterior descending artery is a large  caliber vessel. It gives origin to a small diagonal #1 which has ostial 70-  80% stenosis.  The mid LAD has about 30% stenosis which is unchanged from  prior cardiac catheterization.   IMPRESSION:  1. New circumflex ostial stenosis of 80% and tightly packed with plaque by     IVUS.  2. Patent POBA of the ramus intermediate ostium.  New lesion in the mid     ramus intermediate constituting 70% stenosis. Large caliber vessel ramus     intermediate.  3. 30% mid left anterior descending stenosis which is unchanged from prior     cardiac catheterization.  Diagonal #2 has ostial maybe 70%  stenosis, but     it could be spurious secondary to its origin.  However, it is a very     small 1 mm  to 1.5 mm caliber small vessel.   INTERVENTION DATA:  1. Successful cutting balloon angioplasty of the ramus intermediate with a     3.0 x 6-mm cutting balloon.  IVUS revealed dissection  flap.  Hence, the     patient underwent successful percutaneous transluminal coronary     angioplasty and stenting with a 3.0 x 20-mm Taxus drug-eluting stent with     reduction of stenosis from 70% to 0%.  TIMI-3 to TIMI-3 flow was     maintained at the end of the procedure.  2. Successful cutting balloon angioplasty of the ostial circumflex with a     3.0 x 6-mm cutting balloon.  The stenosis was reduced from 80% to less     than 10% with TIMI-3 to TIMI-3 flow maintained at the end of the     procedure.   RECOMMENDATIONS:  1. Aggressive risk factor modification including aggressive control of     diabetes is indicated.  2. Plavix for at least a period of six to 12 months, probably indefinitely     given her risks.  3. She may need a baseline Cardiolite stress test to establish her profile     especially given new inferolateral hypokinesis with new lesions in the     circumflex which was not amenable for stent implantation as it would have     a potential for jeopardizing the large ramus intermediate and also LAD.  4. Medical therapy for mid LAD stenosis which is 30% stenosed.  No change     from prior cardiac catheterization. RCA is widely patent.   TECHNIQUE OF PROCEDURE:  Diagnostic cardiac catheterization:  Under usual  sterile precautions, using a 6 French right femoral arterial access, a 6  [place] multipurpose pigtail catheter was advanced into the ascending aorta  over a 0.035-inch J wire.  The catheter was then gently advanced to the left  ventricle and left ventricular pressures were monitored.  Hand contrast  injection of the left ventricle was performed both in the LAO and RAO  projection.  The catheter was flushed with saline and pulled back into the  ascending aorta and pressure gradient across the aortic valve was monitored.  The right coronary artery was selectively engaged and angiography was performed.  Then, the catheter was pulled out of the body and a 6 [place]   Judkins left 4 diagnostic catheter was advanced into the ascending aorta in  a similar fashion and left main coronary artery was selectively engaged and  angiography was performed.  Then, the catheter was pulled out of the body in  the usual fashion.  TECHNIQUE OF INTERVENTION:  The 6 French sheaths were exchanged for a 7  [place] sheath.  Then a 7 [place] FL-4 guide was advanced into the ascending  aorta over the 0.035-inch J wire and left main coronary artery was  selectively engaged.  Angiography was performed.  Then, a 180 cm x 0.014-  inch Asahi Prowater guide wire was utilized to cross the ramus intermediate  stenosis and the tip of the wire was carefully placed in the distal ramus  branch.  Then, intravenous heparin was administered and ACT was  maintained  at therapeutic range and IV Integrelin was also required for  anticoagulation.  An Atlantis FR Pro IVUS catheter was utilized to perform  IVUS interrogation of the ramus intermediate branch. After obtaining  adequate measures, the data was carefully analyzed.  Then, a 3.0 x 6-mm  cutting balloon was advanced over the same guide wire and multiple balloon  angioplasties were performed initially at 6 then at 8 atmospheres of  pressure from 95 to 102 seconds.  Then, the balloon was deflated and pulled  back in the guiding catheter and intracoronary nitroglycerin was  administered and angiography was performed.  Excellent results were noted.  However, repeat IVUS interrogation revealed a dissection flap in the mid  segment of the angioplastied site.  Hence, a 3.0 x 20-mm Taxus stent was  advanced over the same guide wire and the stent was deployed at 14  atmospheres of pressure for 66 seconds giving a 3.24 mm lumen.  This was  done for 66 seconds.  The balloon was deflated and pulled back in the  guiding catheter.  Arteriography was performed.  Excellent results were  noted.  After confirming the success, the guide wire was withdrawn  out of  the ramus branch and advanced into the circumflex coronary artery. Then, the  Galaxy IVUS catheter was readvanced over the same guide wire into the  circumflex coronary artery and  careful automatic pullback was performed and  the images were carefully analyzed.  The vessel lumen measured 2.94 mm and  the ostium of circumflex was packed with plaque.  Then, the IVUS catheter  was withdrawn out of the body and a 3.0 x 6-mm cutting balloon was  readvanced over the same guide wire and balloon angioplasty was performed at  5 atmospheres of pressure for 67 seconds and then at 4 atmospheres of  pressure for 90 seconds.  Then, the balloon was deflated and pulled back in  the guiding catheter and intracoronary nitroglycerin was administered and  angiography was performed.  Excellent results were noted.  Then, the guide  wire was pulled out of the body and the guide catheter was disengaged and pulled out of the body in the usual fashion.  The patient tolerated the  procedure well.  No immediate complications were noted.                                               Cristy Hilts. Jacinto Halim, M.D.    Pilar Plate  D:  02/08/2003  T:  02/09/2003  Job:  [id number]  Dani Gobble, MD  [address]  [place]  [place] [address]  Fax: [phone]   [facility]   cc:   Dani Gobble, MD  [phone] [address]  [place]  [place] [address]  Fax: [phone]  [facility]

## 2010-12-14 NOTE — Discharge Summary (Signed)
NAMEPAVIELLE, BIGGAR                ACCOUNT NO.:  [id number]   MEDICAL RECORD NO.:  [id number]          PATIENT TYPE:  INP   LOCATION:  3728                         FACILITY:  [facility]   PHYSICIAN:  Dani Gobble, MD       DATE OF BIRTH:  1934/10/02   DATE OF ADMISSION:  04/01/2006  DATE OF DISCHARGE:  04/07/2006                                 DISCHARGE SUMMARY   DISCHARGE DIAGNOSES:  1. Orthostatic hypotension, improved at discharge.  2. Premature ventricular contractions and nonsustained ventricular      tachycardia, improved on amiodarone.  3. Coronary disease with prior percutaneous coronary intervention of a      ramus intermedius branch with no evidence of restenosis at recent      catheterization.  4. Obesity with question of sleep apnea, workup in progress.  5. Moderate pulmonary hypertension.  6. Non-insulin-dependent diabetes.  7. Systemic hypertension.   HOSPITAL COURSE:  Ms. Kreisler is a 76 year old female followed by Dr. Sherwood Gambler and  Dr. Domingo Sep with a history of coronary disease and recent complaints of  palpitations and weakness.  She was admitted in July.  Recatheterization  showed patent ramus intermedius and no other significant coronary disease.  EF was 55%.  She did have moderate pulmonary hypertension.  She had a CT  scan which was negative for pulmonary embolism.  She has had problems with  palpitations and a monitor revealed nonsustained ventricular tachycardia.  She was seen by Dr. Graciela Husbands and he recommended amiodarone loading as well as  beta blocker.  She was discharged and then seen back in the office by Dr.  Domingo Sep April 01, 2006, with complaints of fatigue and lethargy.  She  was admitted to [facility].  She was seen again in consult by Dr. Graciela Husbands.  We cut  back on her amiodarone and her beta blocker.  Formal orthostatic blood  pressure checks revealed orthostasis.  She was started on midodrine and  Mestinon with improvement in her symptoms.  She apparently did  have a tilt-  table September 6 by Dr. Graciela Husbands.  We feel she can be discharged on April 07, 2006.  She will need to follow up with Dr. Domingo Sep in a couple weeks in  the office and Dr. Graciela Husbands in 3 months.  She will need outpatient surveillance  of her LFTs and TSH while on amiodarone.   DISCHARGE MEDICATIONS:  1. Multivitamin with iron daily.  2. Metformin 1 g b.i.d.  3. Aspirin 81 mg a day.  4. Amaryl 4 mg a day.  5. Plavix 75 mg a day.  6. Protonix or Nexium 40 mg a day.  7. Mevacor 40 mg a day.  8. Amiodarone 200 mg once a day.  9. Lexapro 10 mg once a day.  10.Midodrine 2.5 mg three times a day at 7:00 a.m., 11:00 a.m. and 3:00      p.m.  11.Mestinon 60 mg twice a day.  12.Metoprolol 25 mg twice a day.  13.Lisinopril 20 mg once a day at h.s.   LABORATORIES:  Magnesium was 1.9.  Stool for blood was  negative.  White  count 13.7, hemoglobin 10.9, hematocrit 32.4, platelets 594.  INR 0.9.  Sodium 139, potassium 3.7, BUN 21, creatinine 1.2.  Liver functions are  normal.  CK-MB and troponins are negative x4.  BNP was 100.  TSH is 1.37  with a T4 of 1.8 and T3 of 2.7.  Iron levels 23%, percent saturation 6.  folate was high 1260.  Chest x-ray shows cardiomegaly, no active disease.  EKG shows sinus rhythm.   DISPOSITION:  The patient discharged stable condition and will follow up  with Dr. Domingo Sep and Dr. Graciela Husbands as noted.      Abelino Derrick, P.A.    ______________________________  Dani Gobble, MD    LKK/MEDQ  D:  04/07/2006  T:  04/07/2006  Job:  [id number]   cc:   Duke Salvia, MD, [facility]  Wynona Canes, M.D.

## 2010-12-14 NOTE — Discharge Summary (Signed)
NAMEAFUA, HOOTS                ACCOUNT NO.:  [id number]   MEDICAL RECORD NO.:  [id number]          PATIENT TYPE:  INP   LOCATION:  3737                         FACILITY:  [facility]   PHYSICIAN:  Cristy Hilts. Jacinto Halim, MD       DATE OF BIRTH:  02-08-1935   DATE OF ADMISSION:  03/19/2006  DATE OF DISCHARGE:                                 DISCHARGE SUMMARY   Ms. Blanchard is a 76 year old female, patient of Dr. Vladimir Crofts, with known  coronary artery disease.  She is status post stenting with a TAXUS stent 3.0  x 20 mm to the ramus intermedius and she has also had a cutting balloon  angioplasty to the ostial left circumflex.  TAXUS stenting was done February 08, 2003.  Her last cath was February 10, 2006, which showed the stent was patent.  Her EF was 55% at the time.  She did have elevated pressures suggesting a  diastolic dysfunction and pulmonary hypertension moderate.  She had recently  been seen in the office by Dr. Domingo Sep and an event monitor was ordered,  secondary to symptoms of sudden weakness, tachy palpitations, fatigue and  presyncope.  Her monitored revealed that she had some NSVT with near  syncope, thus she was admitted to the hospital for possible pacemaker.  An  EP consult was called.  She was seen by Dr. Sherryl Manges.  He felt that she  might have two problems of symptoms of NSVT and orthostatic intolerance.  He  suggested amiodarone.  She was shown to have some moderate orthostasis and  the amiodarone was begun on March 21, 2006.  Her statin therapy was  decreased at the time.  She was starting on a loading dose of 400 mg t.i.d.  She was started on magnesium oxide for a mag level of 1.6.  As she was  loaded on the amiodarone, her NSVT decreased.  She was still showing some  symptoms; however, she was much improved.  Her blood pressure was elevated  and Maxzide was added on the day before discharge on March 26, 2006.  She  had gotten about 6 grams of amiodarone.  She felt much  stronger.  Her  Cardizem had then been decreased from 300 to 180 in case of possible  bradycardia, also on metoprolol 100 mg b.i.d.  She was felt to be stable for  discharge.  She was seen by Dr. Graciela Husbands on March 26, 2006.  Her blood  pressure is 138/78.  Her pulse is 72.  Respirations were 18 and O2  saturations were 94% and her temperature was 97.8.  Telemetry was showing  sinus rhythm.  She was having much less NSVT.  She did have some bigemini  when she walked on March 25, 2006, at which time she felt weak.   LABS:  Hemoglobin was 10.0, hematocrit was 30.0, WBCs were 11.5, platelets  were 433, this was on March 23, 2006.  Sodium was 138, potassium was 4.7,  glucose was 183, BUN was 8, creatinine was 0.9, magnesium was 1.6, calcium  was 8.9, total iron  was 22, TIBC was 368, percent sat was 6.  Guaiac was  negative.  TSH was 1.096.   DISCHARGE MEDICATIONS:  Are:  1. Metoprolol 100 mg two times per day.  2. Plavix 75 mg one time per day.  3. Glucophage 1000 mg three times per day.  4. Lisinopril 20 mg two times per day.  5. Aspirin one time per day.  6. Protonix 40 mg one time per day or Nexium as she was taking at home.  7. Mevacor reduced to 40 mg one time per day.  8. Amiodarone 200 mg b.i.d. x3 weeks then 200 mg twice a day and then      after April 17, 2006, she will start 200 mg daily.  9. Amaryl 4 mg before breakfast.  10.Magnesium oxide 400 mg b.i.d.  11.Diltiazem CD 180 mg one time per day.  12.Triamterene HCTZ one time per day.   She will followup with Dr. Domingo Sep on September 11 at 3:30.  She will have  a BMET drawn on Tuesday, April 01, 2006.   Chest x-ray showed cardiomegaly without CHF, bibasilar atelectasis.   DISCHARGE DIAGNOSES:  1. Nonsustained ventricular tachycardia, amiodarone loaded in the hospital      where she received a total of 6 grams prior to discharge.  2. Known coronary artery disease.  3. Known normal left ventricular function.  4.  Hypertension, difficult to control.  5. Pulmonary hypertension.  6. Morbid obesity.  7. Diabetes mellitus.  8. Depression/anxiety.  9. History of elevated D-dimer with negative CT for pulmonary embolism in      July or August of this year.  10.Obstructive sleep apnea.      Lezlie Octave, N.P.      Cristy Hilts. Jacinto Halim, MD  Electronically Signed    BB/MEDQ  D:  03/26/2006  T:  03/26/2006  Job:  [id number]   cc:   Dani Gobble, MD  Duke Salvia, MD, [facility]

## 2010-12-14 NOTE — Discharge Summary (Signed)
NAMEROANNE, HAYE                ACCOUNT NO.:  [id number]   MEDICAL RECORD NO.:  [id number]          PATIENT TYPE:  INP   LOCATION:  A207                          FACILITY:  [facility]   PHYSICIAN:  Madelin Rear. Sherwood Gambler, MD  DATE OF BIRTH:  12-20-1934   DATE OF ADMISSION:  07/24/2006  DATE OF DISCHARGE:  01/03/2008LH                               DISCHARGE SUMMARY   DISCHARGE MEDICATIONS:  1. Amaryl 4 mg p.o. daily.  2. Plavix 75 mg p.o. daily.  3. Aspirin 81 mg daily.  4. Nexium 40 mg p.o. daily.  5. Metoprolol 25 mg p.o. b.i.d.  6. Lisinopril 20 mg p.o. daily.  7. Mevacor 40 mg p.o. q.h.s.  8. Amiodarone 200 mg p.o. daily.  9. Multivitamin daily.  10.Folic acid 1 mg daily.  11.Metformin 1000 mg p.o. b.i.d.  12.Lexapro 10 mg p.o. daily.  13.Midodrine 2.5 mg p.o. t.i.d.  14.Mestinon 30 mg p.o. b.i.d.  15.Xanax 0.25 p.o. q.h.s. p.r.n.  16.Iron tablets p.o. daily x3 weeks.   DISCHARGE DIAGNOSES:  1. Noninsulin dependent diabetes mellitus.  2. Hypertension.  3. Pulmonary hypertension.  4. Chronic anemia.  5. Hemoccult positive stool.   SUMMARY:  The patient was admitted with chest pain, shortness of breath.  She was initially noted to have existing angiomyolipoma of the kidney  with a superimposed history of chronic anemia.  She was found to have  1/3 heme positive stools evaluated by GI in-house, felt to be secondary  to aspirin and Plavix usage.  She was treated for  presumptive pneumonia with a chest CT showing right middle lobe, right  upper lobe bronchitis vs. alveolar infiltrate.  She responded well to  treatment, breathing improved, cough improved and H&H remained stable at  the time of discharge.  She will follow up in office in 1 week for  follow up.      Madelin Rear. Sherwood Gambler, MD  Electronically Signed     LJF/MEDQ  D:  08/14/2006  T:  08/14/2006  Job:  [id number]

## 2010-12-14 NOTE — H&P (Signed)
NAMEJERSEE, WINIARSKI                ACCOUNT NO.:  [id number]   MEDICAL RECORD NO.:  [id number]          PATIENT TYPE:  INP   LOCATION:  A207                          FACILITY:  [facility]   PHYSICIAN:  Madelin Rear. Sherwood Gambler, MD  DATE OF BIRTH:  1935/04/27   DATE OF ADMISSION:  07/24/2006  DATE OF DISCHARGE:  LH                              HISTORY & PHYSICAL   CHIEF COMPLAINT:  Chest pain.   HISTORY OF PRESENT ILLNESS:  The patient had several episodes of  intermittent chest discomfort described as substernal and epigastric in  location.  It was associated with shortness of breath.  She noted  particularly increase in pain with crossing the room, movement,  exertion, as well as an increase in her shortness of breath.  She did  not have any syncope or palpitations.   PAST MEDICAL HISTORY:  Is pertinent for orthostatic hypotension,  apparently cholelithiasis and, maybe, some chronic cholecystitis  documented by previous CT abdomen done for early satiety, anemia,  fatigue, and hypotension.  He had an incidentally noted 1 cm  angiomyolipoma suggested on the CT scan done as an outpatient.  Sigmoid  diverticulosis was also noted.  An ultrasound was attempted to be set  up, however, the patient has not yet to complete that test to follow up  on the angiomyolipoma workup of the kidney.  On the day of ER  presentation, she did admit to a day and a half of cough and possibly  chills without true rigors or fever documented.  She had scanty sputum  production.  Past medical history is also pertinent for diabetes  mellitus, hypertension.  She is status post catheterization recently  that showed clean coronaries.  She was noted to have primary pulmonary  hypertension.  Her ejection fraction last catheterization was noted to  be 55%.  She carries a probable recent diagnosis of obstructive sleep  apnea.   SOCIAL HISTORY:  Nonsmoker, nondrinker.  Lives at home with her family.   FAMILY HISTORY:   Noncontributory.   REVIEW OF SYSTEMS:  Under HPI, all else negative.   PHYSICAL EXAMINATION:  SKIN:  Unremarkable.  HEAD/NECK:  No JVD or adenopathy.  Neck supple.  CHEST: Sonorous rhonchi, bibasilar rales, right greater than left, and  end-expiratory wheezing.  CARDIAC EXAM:  Regular rhythm.  No gallop or  rub.  ABDOMEN: Soft.  No organomegaly or mass.  Specifically, there is no  right upper quadrant tenderness, guarding, or rebound.  EXTREMITIES:  Without clubbing, cyanosis, or edema.  NEURO EXAM:  Nonfocal.   A radiograph was obtained which revealed atelectasis versus infiltrate  per radiology interpretation.  She also was noted to have a markedly  elevated white count of 20,000, although no fever in the emergency  department was documented.  Renal function was normal.  She was  hyperglycemic, consistent with her underlying diabetes.  Electrolytes,  otherwise, unrevealing.  Liver function tests, amylase, and lipase were  not ordered and were added at the time of dictation.   IMPRESSION:  1. Chest pain, probably noncardiac, may be referred from gallbladder,  although she has a benign exam at present.  With an elevated D-      dimer, we will go ahead and get a computerized tomography of chest      to make sure she does not  have a pulmonary embolus, although this      is doubtful clinically.  White count is elevated with cough and      pulmonary findings suggestive of pneumonia.  Will treat her with IV      antibiotics and around-the-clock bronchodilator therapy.  Will      withhold steroids at the present time.  2. Cholecystitis and cholelithiasis.  Will consult Surgery for      possible cholecystectomy when clinically improved and stable.  We      will also consult Cardiology regarding her pulmonary hypertension      and chest pain syndrome.  Will start some proton pump inhibitor      (Protonix) for any underlying possible esophageal spasm      contributing to the chest  pain.  3. Possible angiomyolipoma of kidney.  Will have an ultrasound and      Urology consultation as well.      Madelin Rear. Sherwood Gambler, MD  Electronically Signed     LJF/MEDQ  D:  07/24/2006  T:  07/24/2006  Job:  [id number]

## 2010-12-14 NOTE — Consult Note (Signed)
NAMEJINNIFER, MONTEJANO                ACCOUNT NO.:  [id number]   MEDICAL RECORD NO.:  [id number]          PATIENT TYPE:  INP   LOCATION:  A207                          FACILITY:  [facility]   PHYSICIAN:  R. Roetta Sessions, M.D. DATE OF BIRTH:  1935-03-03   DATE OF CONSULTATION:  DATE OF DISCHARGE:                                 CONSULTATION   GASTROENTEROLOGY CONSULTATION   REFERRING PHYSICIAN:  Madelin Rear. Sherwood Gambler, MD   CONSULTING PHYSICIAN:  R. Roetta Sessions, M.D.   REASON FOR CONSULTATION:  Hemoccult positive stools, anemia.   HISTORY OF PRESENT ILLNESS:  Ms. Capers is a 76 year old female who was  admitted July 24, 2006 with chest pain, found to have bronchitis.  She has a history of chronic anemia, hemoglobin of 9.5 today, 10.7 upon  admission with MCV of 86.2.  She was found to have one out of three  Hemoccult cards positive.  She denies any abdominal pain, rectal  bleeding or melena.  She denies any nausea or vomiting.  She denies any  heartburn, indigestion. She has been on Nexium for two to three years  now; her symptoms are well controlled.  She denies any early satiety.  She does complain of some anorexia. She also has some shortness of  breath on exertion and productive cough with thick mucous.  She is on  aspirin and Plavix at home.  She complains of weakness and fatigue.  She  has never had a colonoscopy or EGD.  She has had one to two normal,  soft, brown bowel movements per day.  She has been on iron for three  months intermittently.   PAST MEDICAL AND SURGICAL HISTORY:  1. Diabetes mellitus.  2. Hypertension.  3. She had a recent negative cardiac catheterization.  4. She has a history of pulmonary hypertension.  5. Sleep apnea.  6. Orthostatic hypotension.  7. Gastroesophageal reflux disease.  8. Angiodysplasia of kidney found on CT scan recently.  9. Sigmoid diverticulosis.  10.Cholelithiasis with chronic cholecystitis scheduled to have      outpatient  cholecystectomy by Dr. Lovell Sheehan.  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   MEDICATIONS PRIOR TO ADMISSION:  1. Amaryl 4 mg daily.  2. Plavix 75 mg daily.  3. Aspirin 81 mg daily.  4. Nexium 40 mg daily.  5. Metoprolol 25 mg b.i.d.  6. Lisinopril 20 mg q.h.s.  7. Mevacor 40 mg q.h.s.  8. Amiodarone 200 mg daily.  9. Multivitamin daily.  10.Folic acid daily.  11.Metformin 1 gram b.i.d.  12.Lexapro 10 mg daily.  13.Mestinon 30 mg b.i.d.  14.Xanax 0.25 mg q.h.s.  15.Iron once daily.  16.Amidrine 2.5 mg t.i.d.   ALLERGIES:  No known drug allergies.   FAMILY HISTORY:  There is no known history of colorectal carcinoma or  other chronic gastrointestinal problems.  Mother deceased age 62  secondary to childbirth.  Father deceased age 82 secondary to coronary  artery disease.  She had one half sister, now deceased.  She has one son  deceased with MI, one son alive with MS.   SOCIAL  HISTORY:  She is married.  She denies any tobacco or drug use.  She is retired day Market researcher.   REVIEW OF SYSTEMS:  CONSTITUTIONAL:  Weight is stable.  She denies any  fever or chills.  CARDIOVASCULAR:  Occasional palpitations and chest  pain.  See history of present illness.  PULMONARY:  See history of  present illness.  GASTROINTESTINAL:  See history of present illness.  GENITOURINARY:  Denies any dysuria, hematuria, increased urinary  frequency. HEMATOLOGICAL:  Denies any bleeding or easy bruising.  She  has had a history of anemia.  See HPI.   PHYSICAL EXAMINATION:  VITAL SIGNS:  Weight 222.3 pounds, height 64  inches.  Temperature 98.  Pulse 92.  Respirations 22.  Blood pressure  152/71.  GENERAL APPEARANCE:  Ms. Slavens is a 76 year old obese Caucasian female in  no acute distress.  She is alert, oriented, pleasant and cooperative.  HEENT:  Sclerae clear, nonicteric.  Conjunctivae pink.  Oropharynx pink  and moist without any lesions.  NECK:  Supple without any masses or  thyromegaly.  HEART:  Respiratory rate with normal S1 and S2 without any murmurs, rubs  or gallops.  LUNGS:  With decreased breath sounds bilaterally.  She also has crackles  bilateral bases, expiratory wheezes.  ABDOMEN:  Protuberant with positive bowel sounds x4.  No bruits  auscultated.  Abdomen is soft, nontender, nondistended without palpable  masses or organomegaly.  No rebound, tenderness or guarding.  Exam is  limited due to the patient's body habitus.  EXTREMITIES:  Trace bilateral lower extremity edema.  SKIN:  Pink, warm and dry without any rashes or jaundice.   LABORATORY DATA:  She has positive D-dimer 0.8.  Amylase and lipase are  normal.  She has serum calcium 8.7, sodium 139, potassium 3.7, chloride  99, CO2 32, BUN 13, creatinine 1.03, glucose 107.  On July 24, 2006  she had a total bilirubin 1.1, alkaline phosphatase 55, AST 18, ALT 14,  total protein 6.9, albumin 3.7, normal TSH.   IMPRESSION:  Ms. Jane is a 76 year old female admitted with chest pain,  bronchitis, diabetes mellitus, hypertension, angiolipoma of kidney,  found to have Hemoccult positive stool.  She has a history of chronic  anemia.  She has been on intermittent iron for the past three months.  She has history of chronic gastroesophageal reflux disease well  controlled on proton pump inhibitor.  She has never had a colonoscopy.  Her hemoglobin is relatively stable since admission.  She has not had  gross gastrointestinal bleeding.  I have discussed colonoscopy and  esophagogastroduodenoscopy with the patient for further evaluation.  She  prefers to wait until she gets over her acute illness and do this on an  outpatient basis to rule out colorectal carcinoma, slow gastrointestinal  bleed, (arteriovenous malformation's  or occult malignancy).  She also  has cholelithiasis with chronic cholecystitis and she is scheduled to have an outpatient cholecystectomy after her recovery, with Dr. Lovell Sheehan.    PLAN:  1. Will follow H and H's and schedule outpatient colonoscopy and      esophagogastroduodenoscopy unless she has a drop in her hemoglobin.      She will need to be off Plavix and aspirin for about four days      prior to the exam.  2. Check H and H in the morning and agree with daily b.i.d. proton      pump inhibitor.   We would like to thank Dr.  [name] for allowing [place] to participate in the  care of Ms. Say.      Nicholas Lose, N.P.      Jonathon Bellows, M.D.  Electronically Signed    KC/MEDQ  D:  07/28/2006  T:  07/28/2006  Job:  [id number]

## 2010-12-14 NOTE — H&P (Signed)
NAMEKEAIRA, [name] Mueller                ACCOUNT NO.:  [id number]   MEDICAL RECORD NO.:  [id number]          PATIENT TYPE:  AMB   LOCATION:                                FACILITY:  [facility]   PHYSICIAN:  Dalia Heading, M.D.  DATE OF BIRTH:  1934/11/14   DATE OF ADMISSION:  DATE OF DISCHARGE:  LH                              HISTORY & PHYSICAL   CHIEF COMPLAINT:  Cholecystitis, cholelithiasis.   HISTORY OF PRESENT ILLNESS:  The patient is a 76 year old white female  who is referred for evaluation and treatment biliary of colic secondary  to cholelithiasis.  She has been having right upper quadrant abdominal  pain, nausea, bloating for many months.  She does have fatty food  intolerance.  No fever, chills, or jaundice has been noted.   PAST MEDICAL HISTORY INCLUDES:  1. Non-insulin-dependent diabetes mellitus.  2. Hypertension.  3. Coronary artery disease, remote history of pulmonary hypertension      which is stable, angiolipoma of the kidney.   PAST MEDICAL HISTORY:  Includes non-insulin dependent diabetes mellitus,  hypertension, coronary artery disease, remote history of pulmonary  hypertension which is stable, angiolipoma of the kidney   PAST SURGICAL HISTORY:  Hysterectomy.   CURRENT MEDICATIONS:  1. Amaryl 4 mg p.o. daily.  2. Nexium 40 mg p.o. daily.  3. Metoprolol 25 mg p.o. b.i.d..  4. Lisinopril 20 mg p.o. q.h.s.  5. Mevacor 40 mg p.o. q.h.s.  6. Amiodarone 200 mg p.o. daily.  7. Lexapro 10 mg p.o. daily.  8. Iron supplements.  9. Xanax.  10.Midodrine 2.5 mg p.o. t.i.d.   ALLERGIES:  No known drug allergies.   REVIEW OF SYSTEMS:  The patient denies any recent MI, chest pain,  shortness of breath, leg swelling, or CVA.   PHYSICAL EXAMINATION:  GENERAL:  The patient is a well-developed, well-  nourished white female in no acute distress.  HEENT EXAMINATION:  Reveals no scleral icterus.  LUNGS:  Clear to auscultation with equal breath sounds bilaterally.  HEART  EXAMINATION:  Reveals regular rate and rhythm without S3-S4 or  murmurs.  ABDOMEN:  Soft and nondistended.  She is tender in the right upper  quadrant to palpation.  No hepatosplenomegaly, masses, or hernias are  identified.  Ultrasound of the gallbladder reveals cholelithiasis with a  normal common bile duct.   IMPRESSION:  Cholecystitis, cholelithiasis.   PLAN:  The patient is scheduled for a laparoscopic cholecystectomy on  09/03/2006.  The risks and benefits of the procedure including bleeding,  infection, hepatobiliary injury, and the possibly an open procedure were  fully explained to the patient, gave informed consent.      Dalia Heading, M.D.  Electronically Signed     MAJ/MEDQ  D:  09/02/2006  T:  09/02/2006  Job:  [id number]   cc:   [facility]  Fax: [phone]   Madelin Rear. Sherwood Gambler, MD  Fax: [phone]   Dani Gobble, MD  Fax: [phone]

## 2010-12-14 NOTE — Op Note (Signed)
[name] Mueller, [name] Mueller                ACCOUNT NO.:  [id number]   MEDICAL RECORD NO.:  [id number]          PATIENT TYPE:  AMB   LOCATION:  DAY                           FACILITY:  [facility]   PHYSICIAN:  R. Roetta Sessions, M.D. DATE OF BIRTH:  05/11/1935   DATE OF PROCEDURE:  08/27/2006  DATE OF DISCHARGE:                               OPERATIVE REPORT   PROCEDURE:  Diagnostic esophagogastroduodenoscopy, followed by  colonoscopy.   ENDOSCOPIST:  Jonathon Bellows, M.D.   INDICATIONS FOR PROCEDURE:  This 76 year old lady with chronic anemia  and Hemoccult-positive stools.  She has longstanding gastroesophageal  reflux disease symptoms.  She never had her lower GI tract evaluated.  An EGD and colonoscopy are now being done.  I discussed with the patient  at length the potential risks, benefits and alternatives which have been  reviewed and questions answered.  She agreed with this.  Please see  documentation in the medical record.   DESCRIPTION OF PROCEDURE:  Oxygen saturation, blood pressure, pulse and  respirations were monitored throughout the entirety of the above  procedures.  Conscious sedation with  IV Versed 5 mg and IV Demerol 100  mg in divided doses.  Cetacaine spray with topical pharyngeal  anesthesia.  The Pentax videoscope system.   FINDINGS ON ESOPHAGOGASTRODUODENOSCOPY:  Examination of the tubular  esophagus revealed non-critical Schatzki's ring, otherwise the  esophageal mucosa appeared normal.  The esophagogastric junction was  easily traversed.   STOMACH:  The gastric cavity was empty and insufflated well with air.  A  thorough examination of the gastric mucosa, including retroflexion to  view the proximal stomach and esophagogastric junction demonstrated only  a small hiatal hernia.  The pylorus was patent and easily traversed.  Examination of the bulb and second portion revealed no abnormalities.   THERAPEUTIC/DIAGNOSTIC MANEUVERS PERFORMED:  None.   The patient  tolerated the procedure well and was prepared for  colonoscopy.   COLONOSCOPY:  A digital rectal exam revealed no abnormalities.  The  endoscopy findings revealed the prep was adequate.  The examination of  the colonic mucosa was undertaken from the rectosigmoid junction.  The  scope was advanced through the left transverse and right colon, the  ileocecal valve and cecum.  The structures were well seen and  photographed for the record.  The terminal ileum measured to 10 cm.  The  scope was slowly and cautiously withdrawn.  All previously-mentioned  mucosal surfaces were again seen.  The patient had pan-colonic  diverticula; however, the remainder of the colonic mucosa appeared  normal.  The scope was pulled down into the rectum where a thorough  examination of the rectal mucosa including a retroflexed view of the  anal verge was undertaken.  The patient had internal hemorrhoids and  some anal papillae.  Otherwise the rectal mucosa appeared normal with  normal terminal ileum.   The patient tolerated the procedure well and was reactive in endoscopy.   IMPRESSION:  ESOPHAGOGASTRODUODENOSCOPY  1. Non-critical Schatzki's ring, __________, with otherwise normal      esophagus.  2. Small hiatal hernia, otherwise normal  stomach, D-I and D-II.   COLONOSCOPY  1. Anal papillae, internal hemorrhoids, otherwise normal rectum.  2. Pan-colonic diverticula.  The remainder of the colonic mucosa      appeared normal with a normal terminal ileum.  3. No significant findings in either the patient's upper or lower      gastrointestinal tracts.   RECOMMENDATIONS:  1. Diverticulosis literature provided to Ms. Fotheringham.  2. From a standpoint of the findings on today's endoscopic evaluation,      there should be no reason why Ms. Whitsitt cannot proceed with plans      for an elective cholecystectomy.      Jonathon Bellows, M.D.  Electronically Signed     RMR/MEDQ  D:  08/27/2006  T:  08/27/2006  Job:   [id number]   cc:   Dalia Heading, M.D.  Fax: [phone]   Madelin Rear. Sherwood Gambler, MD  Fax: [phone]

## 2010-12-14 NOTE — H&P (Signed)
NAMEMEKHI, LASCOLA                ACCOUNT NO.:  [id number]   MEDICAL RECORD NO.:  [id number]          PATIENT TYPE:  AMB   LOCATION:                                FACILITY:  [facility]   PHYSICIAN:  R. Roetta Sessions, M.D. DATE OF BIRTH:  06-14-1935   DATE OF ADMISSION:  08/13/2006  DATE OF DISCHARGE:  LH                              HISTORY & PHYSICAL   CHIEF COMPLAINT:  Hospital followup chronic anemia.   Ms. Baria is a 76 year old Caucasian female who was admitted with chest  pain, bronchitis back at the end of December 2007.  She was found to  have hemoccult-positive stool.  She has a history of chronic anemia, has  been on p.o. iron for a the past 4 months.  She also has a history of  chronic GERD well-controlled on PPI.  She has never had a colonoscopy  nor EGD.  She denies any gross GI bleeding.  Denies any abdominal pain.  She had previously been on aspirin and Plavix, although this was  discontinued during hospitalization.  See July 28, 2006, hospital  note for further information.   PAST MEDICAL HISTORY:  1. Diabetes mellitus.  2. Hypertension.  3. Negative cardiac catheterization.  4. Pulmonary hypertension.  5. Sleep apnea.  6. Orthostatic hypotension.  7. Chronic GERD.  8. Chronic anemia on iron for the last 4 months.  9. 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  10.Sigmoid diverticulosis.  11.Cholelithiasis.  12.Chronic cholecystitis, scheduled to have outpatient cholecystectomy      by Dr. Lovell Sheehan post colonoscopy and EGD.  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   CURRENT MEDICATIONS:  1. Amaryl 4  mg daily.  2. Nexium 40 mg  daily.  3. Metoprolol 25 mg b.i.d.  4. Lisinopril 20 mg nightly.  5. Mevacor 40 mg nightly.  6. Amiodarone 200 mg in the morning.  7. Multivitamin with iron daily.  8. Folic acid 1 mg daily.  9. Lexapro 10 mg daily.  10.Midodrine 2.5 mg, one in a.m. one at noon, and one at 4 p.m.  11.Iron once daily.  12.Xanax 0.25 mg nightly.   ALLERGIES:  No known drug allergies.   FAMILY HISTORY:  Noncontributory   SOCIAL HISTORY:  She is married.  She denies tobacco, alcohol or drug  use.  She is a retired day Market researcher.   REVIEW OF SYSTEMS:  CONSTITUTIONAL: Weight is stable.  Denies any fever  or chills.  CARDIOVASCULAR: Denies chest pain or palpitations currently.  She has history of recent bronchitis PULMONARY:  Denies shortness of  breath, dyspnea, cough, hemoptysis.  GI: See HPI.  Denies any dysphagia  or aphasia, anorexia or early satiety, constipation or diarrhea.  GU:  Denies any dysuria, hematuria, increased urinary frequency.  HEMATOLOGICAL: Denies any bleeding or easy bruising. She denies any  family history of blood dyscrasia.   PHYSICAL EXAMINATION:  VITAL SIGNS: Weight 230 pounds, height 64 inches.  Temperature 97.9, blood pressure 170/86,  pulse 68.  GENERAL:  Ms. Waid is an obese Caucasian female  who is alert and  pleasant, cooperative in no acute distress  HEENT:  Clear, nonicteric.  Conjunctivae pink.  Oropharynx pink and  moist without lesions.  NECK: Supple without thyromegaly.  HEART:  Regular rate and rhythm, S1, S2 without murmurs, rubs, or  gallops.  LUNGS: Clear to auscultation bilaterally.  ABDOMEN: Protuberant with positive bowel sounds x4.  No bruits  auscultated.  Soft, nontender, non distended.  Without palpable mass or  hepatosplenomegaly. No rebound or guarding.  EXTREMITIES: Without clubbing or edema bilaterally.  SKIN:  Pale, warm and dry without rash or jaundice   IMPRESSION:  Ms. Natarajan is a 75 year old Caucasian female with history of  chronic anemia and has been on intermittent iron for the last 4 months  while hospitalized for bronchitis. At the end of December, her  hemoglobin was around 9.5. She has chronic gastroesophageal reflux  disease well controlled on PPI.  She has never had EGD nor colonoscopy.  She denies any GI complaints  at this time including abdominal pain,  weight loss, rectal bleeding, or melena.  She is going to require  further evaluation for anemia and hemoccult-positive stools to rule out  occult GI malignancy.  She is scheduled to have cholecystectomy due  cholelithiasis with chronic cholecystitis by Dr. Lovell Sheehan after  colonoscopy and EGD.   PLAN:  1. Colonoscopy and EGD with Dr. Jena Gauss in the near future.  I discussed      both procedures including risks and benefits, which include but not      limited to infection, perforation, drug reaction.  Consent will be      obtained.  Her      iron and Plavix has been discontinued since hospitalization.  2. Would check CBC the day of the procedures.  3. She is told iron for 7 days prior to procedure.      Nicholas Lose, N.P.      Jonathon Bellows, M.D.  Electronically Signed    KC/MEDQ  D:  08/13/2006  T:  08/13/2006  Job:  [id number]   cc:   Madelin Rear. Sherwood Gambler, MD  Fax: [phone]

## 2010-12-14 NOTE — Op Note (Signed)
NAMERASCHELLE, WISENBAKER                ACCOUNT NO.:  [id number]   MEDICAL RECORD NO.:  [id number]          PATIENT TYPE:  INP   LOCATION:  3728                         FACILITY:  [facility]   PHYSICIAN:  Duke Salvia, MD, FACCDATE OF BIRTH:  1934/09/05   DATE OF PROCEDURE:  04/03/2006  DATE OF DISCHARGE:  04/07/2006                                 OPERATIVE REPORT   PREOPERATIVE DIAGNOSES:  Syncope, nonsustained ventricular tachycardia.   POSTOPERATIVE DIAGNOSIS:  Orthostatic hypotension.   PROCEDURE:  Tilt table testing under orthostatic protocol.   SURGEON:  Duke Salvia, MD, [facility].   DESCRIPTION OF PROCEDURE:  After obtaining informed consent, the patient was  calibrated in the supine position.  Thereafter, the patient was advanced to  15, 30, 45, and then 70 degrees, the latter of which he tolerated for less  than 5 minutes.  Initial blood pressures were 179/64.  Upon being tilted 15  degrees, the blood pressure started to fall so that it was 144/77 after 5  minutes of 15 degrees.  It was about the same at 30 degrees, a little bit  lower at 139/56 at 45 degrees, and then at 70 degrees, the blood pressure  fell to 80/37 without a change in heart rate.   IMPRESSION:  Orthostatic hypotension.           ______________________________  Duke Salvia, MD, [facility]     SCK/MEDQ  D:  05/22/2006  T:  05/22/2006  Job:  [id number]   cc:   Dr Ilene Qua  Electrophysiology Laboratory

## 2010-12-14 NOTE — Consult Note (Signed)
[name], Mueller                ACCOUNT NO.:  [id number]   MEDICAL RECORD NO.:  [id number]          PATIENT TYPE:  INP   LOCATION:  2921                         FACILITY:  [facility]   PHYSICIAN:  Duke Salvia, MD, FACCDATE OF BIRTH:  03-11-1935   DATE OF CONSULTATION:  04/01/2006  DATE OF DISCHARGE:                                   CONSULTATION   CARDIOLOGY CONSULTATION   Thank you very much for asking [place] to see [name] Mueller again in consultation  because of dizziness and exercise intolerance.   HISTORY OF PRESENT ILLNESS:  She was hospitalized about 2 weeks ago for  similar complaints.  On evaluation she decided to have orthostatic  intolerance with blood pressure drops of 30-50 mm with a heart rate increase  of 10-15.  She was also found to have significant orthostatic ventricular  ectopy that is to say when she got up and started moving about there was a  marked increase in the ventricular ectopy so that she was even having  nonsustained ventricular tachycardia with ambulation.  It is not clear to me  to what degree the measured orthostatics reflected that.   PAST MEDICAL HISTORY:  1. Notable for diabetes.  2. Hypertension.  3. Coronary artery disease with prior PCI of a ramus with a PTCA of the      ostial surface but now with evidence of an empire myocardial infarction      with no evidence of obstructive disease at recent catheterization.  4. Moderate pulmonary hypertension.  5. Morbid obesity with question sleep apnea.   CURRENT MEDICATIONS:  1. Metoprolol 100 b.i.d.  2. Amiodarone 200 b.i.d.  3. Diltiazem 180.  4. Triamterene/hydrochlorothiazide.  5. Potassium.  6. Mevacor.  7. Lisinopril.  8. Glucophage 1000 t.i.d.  9. Plavix 75.  10.Amaryl 4.  11.Nexium 40.   ALLERGIES:  SHE HAS NO KNOWN DRUG ALLERGIES.   SOCIAL HISTORY:  She is married.  She has children that are here with her  today.   PHYSICAL EXAM:  GENERAL:  On examination she is an elderly  Caucasian female  appearing somewhat older than her stated age of 50.  VITAL SIGNS:  Her blood pressure is 160/62, her pulse is 67 and regular,  with walking she developed bigeminy with a rate of about 105.  HEENT EXAM:  Demonstrated no icterus or xanthoma.  NECK:  Neck veins were flat.  The carotids are brisk and full bilaterally  without bruits.  BACK:  The back was without kyphosis, scoliosis.  LUNGS:  Clear.  HEART:  Sounds were regular with a S4.  THE ABDOMEN:  Soft with active bowel sounds.  EXTREMITIES:  Femoral pulses were 2+ , distal pulses are intact.  There is  no clubbing, cyanosis, or edema.  NEUROLOGICAL EXAM:  Grossly normal.  SKIN:  Warm and dry.   LABORATORY:  Laboratories dated today demonstrated a hemoglobin of 10.9.  A  BMET was normal.   Electrocardiogram was normal.   IMPRESSION:  1. Weakness.  2. Presyncope.  3. Right ventricular outflow tract ventricular ectopic activity with  ventricular tachycardia previously documented, treated with amiodarone,      now finding some bigeminy with ambulation.  4. Coronary artery disease.      a.     Prior myocardial infarction.      b.     Prior percutaneous coronary intervention.      c.     Normal left ventricular function.  5. Diabetes.  6. Hypertension.  7. Orthostasis.  8. Recumbent dyspnea question congestive heart failure.   The question is what is the major culprit in Mrs. [name] Mueller symptomatic weakness  which is in large part positional.  Two things are happening one is  ventricular ectopy, the other is orthostasis.  The fact that she had marked  amelioration in her symptoms with a significant suppression of ventricular  ectopy suggest that the ventricular ectopy is a large, all be it, probably  not the sole contributor to her orthostatic intolerance and her weakness.  The fact that the amiodarone is not doing the job and given the fact that  her symptoms are worsening, it may be time to think about evasive  electrical  study with RF catheter ablation directed at the ectopy which appears to be  coming from the right ventricular outflow tract.   PLAN:  We will plan to:  1. Obtain formal orthostatics.  2. Need to sort out the rhythm versus drug versus orthostatic component.  3. RF catheter ablation of ectopy may be appropriate.  We will plan to      hold her amiodarone Cardizem and decrease her beta blocker.  4. Chest  x-ray to access for congestive heart failure.  5. Diuresis as she will need to be recumbent for the above procedure.  6. Will probably also need a CPAP.   Unfortunately, doing this under anesthesia is not typically helpful as the  ventricular ectopy is often suppressed.           ______________________________  Duke Salvia, MD, [facility]     SCK/MEDQ  D:  04/01/2006  T:  04/01/2006  Job:  [id number]   cc:   Electrophysiology Laboratory  Dani Gobble, MD  Madelin Rear. Sherwood Gambler, MD

## 2010-12-14 NOTE — H&P (Signed)
[name] Mueller, [name] Mueller                ACCOUNT NO.:  [id number]   MEDICAL RECORD NO.:  [id number]           PATIENT TYPE:   LOCATION:                                 FACILITY:   PHYSICIAN:  Dani Gobble, MD       DATE OF BIRTH:  1935-01-08   DATE OF ADMISSION:  DATE OF DISCHARGE:                                HISTORY & PHYSICAL   REFERRING PHYSICIAN:  Madelin Rear. Sherwood Mueller, M.D.  [name] Salvia, MD, [facility].   HISTORY OF PRESENT ILLNESS:  [name] is a very pleasant 76 year old female  with past medical history of CAD, status post stenting, hypertension,  hyperlipidemia, diabetes mellitus.  She also has a recent diagnosis of sleep  apnea and is to be scheduled for CPAP titration later this month.  Although  her sleep apnea severity was graded as mild, given her recent nonsustained  ventricular tachycardia and complaints of daytime tremulousness and weakness  and fatigue, I believe CPAP titration is definitely indicated.   She is status post a 3 x 20 mm Taxus drug-eluting stent to the ramus  intermedius and cutting balloon angioplasty for an ostial 80% left  circumflex lesion.  Most echo 2005 revealed marked LVH with normal left  ventricular function and diastolic dysfunction.  She also had moderate to  severe left atrial enlargement and mild right ventricular enlargement and  mild aortic sclerosis without stenosis.   We have been evaluating her since July for primarily constitutional  complaints but she also had jaw discomfort somewhat concerning for anginal  equivalent.  She had a repeat cardiac catheterization and I added a right  heart catheter as well.  She did have moderate pulmonary hypertension with  an RV pressure of 49/3 and a wedge of 33 mmHg.  She had normal cardiac  index, output and index at 4.6 L per minute and 2.1 L per minute per sq m,  respectively.  She did have an elevated SVR and was intra-arterially  hypersensitive.  Mean RA pressure was 11 mmHg.  Her ejection  fraction was  55% with mild anterolateral wall hypokinesis.  The ramus intermedius  contained a widely patent stent and she had no other coronary artery disease  to speak of.  She had widely patent __________ .   She has had a chest CT for pulmonary protocol given a minimally elevated D-  dimer.  This was negative.   As she also had complaints of palpitations, we pursued a CardioNet which  revealed frequent nonsustained ventricular tachycardia.  She was admitted to  [facility] at that time and was evaluated by Dr. Sherryl Manges in  electrophysiology.  He recommended amiodarone and she was loaded during her  recent hospitalization.  She continued to have constitutional complaints of  weakness, fatigue, and lethargy.  She was discharged on August 29.  It  appears, by my calculations, that she has had approximately 8400 g of  amiodarone so she is either close to or at load for amiodarone.   She has requested to be seen today as an add-on.  Of note,  her Metoprolol  was increased to 100 b.i.d. as well.  There was some questionable __________  so her other medications were downward adjusted to accommodate these  medication adjustments.   Today, she is clearly frustrated to the point of tears.  She was told I was  going to be able to return to all of my activities before the  hospitalization and feel normal again.  Apparently she inferred this from a  conversation with Dr. Graciela Husbands.  She does continue to report palpitations.  She  reports mild chest discomfort.  To my mind, she certainly seems to have a  problem that is multifactorial.  I believe that the initial etiology of the  complaints is likely frequent nonsustained ventricular tachycardia.  However, she has been quite sedentary due to these above-noted  constitutional complaints for upwards of four or five months.  Added on top  of that, an increased dose of beta blocker and amiodarone and I believe that  this can certainly all  be contributory.  However, she appears quite  frightened and feels that she is not being adequately treated.  She believes  that she needs an AICD.  I have explained that this would not be the initial  approach with recent coronary arteries which were patent.  However, she  clearly is at her wit's end.  For this reason, I will admit her to the  hospital, particularly given her complaints of chest discomfort and monitor  her on telemetry.  Hopefully this will be a brief admission and I will ask  Dr. Graciela Husbands to evaluate her as well.  She also requests to see Dr. Alanda Amass  during this admission so I will see if this is feasible.  Additionally, we  will ask her to ambulate during this hospitalization to see if there is  exercise or stress-induced ventricular tachycardia.  The original plan was  for a CardioNet to be placed on September 12 and we may well adhere to this  plan depending on the outcome of this hospitalization.  A last thought is  that it may be prudent to have aggressive physical therapy and cardiac rehab  to see if we can address the deconditioning aspect of this problem.  Again I  do believe she needs CPAP given her obstructive sleep apnea even if mild  with these arrhythmic problems.   PAST MEDICAL HISTORY:  As outlined above.   ALLERGIES:  ZETIA.   CURRENT MEDICATIONS:  1. Metformin 1000 mg b.i.d.  2. Aspirin 81 mg daily.  3. Amaryl 4 mg daily.  4. Diltiazem 180 mg daily (previously at 360).  5. Toprol-XL 100 mg b.i.d up from 50 mg at night.  6. Mevacor 40 mg at night.  7. Plavix 75 mg daily.  8. Lisinopril 20 mg b.i.d.  9. Nexium 40 mg daily.  10.Amiodarone currently 200 mg b.i.d. to be decreased to 200 mg daily on      April 17, 2006.  Of note she had a load of 400 t.i.d. during the      hospitalization.  11.Maxzide 37.5 one daily.  12.Magnesium oxide 400 mg b.i.d.   SOCIAL HISTORY:  She is married and no alcohol or tobacco abuse or illegal  drug  use.  FAMILY HISTORY:  Noncontributory to this admission.   REVIEW OF SYSTEMS:  Negative on a 10-point except as outlined above.   PHYSICAL EXAMINATION:  GENERAL:  Frustrated, obese, white female, in no  acute distress.  Alert and oriented x3.  VITAL  SIGNS:  Her height is 5 feet 5 inches tall, weight 237 pounds, down  three pounds from last visit.  Blood pressure today 150/76 with a heart rate  of 72.  Previously her blood pressure was 120/70 in the office.  NECK:  Negative for JVD at 90 degrees.  No bruits were detected.  No obvious  lymphadenopathy or thyromegaly.  LUNGS:  Clear throughout without crackles, wheezes or rhonchi appreciated.  CARDIAC:  Regular rate and rhythm with a continued 2/6 systolic murmur which  is unchanged.  ABDOMEN:  Obese, but notable for positive bowel sounds in all four  quadrants.  EXTREMITIES:  Lower extremities are notable for mild edema which is  unchanged from prior visits.   LABORATORY DATA:  EKG performed in the office today reveals normal sinus  rhythm at 72 beats per minute without ischemic changes noted.  The Q-T  intervals are normal.  The P-R intervals are normal and there is no ectopy  appreciated.  Lab work that I have available from August 26:  White blood  count 11.5, hematocrit 30.3, platelets 433,000.  No left shift was noted on  August 22 on the differential.  INR 1.  Sodium 138, potassium 4.7, chloride  106, bicarb 26, glucose 183, BUN 8, creatinine 0.9, calcium 8.9, magnesium  1.6.  Hemoglobin A1C was 8.8, reflecting uncontrolled diabetes mellitus.  Cardiac enzymes were entirely negative.  TSH was also normal on multiple  checks during this hospitalization.  She did appear to have iron deficiency  anemia by studies.  She was fecal occult blood negative.   IMPRESSION:  1. Nonsustained ventricular tachycardia now on amiodarone and increased      doses of beta blocker.  2. Frustration with continued marked constitutional complaints  which she      thinks are actually worse since her previous admission with weakness,      fatigue, and lethargy.  3. Coronary artery disease as outlined above.  4. Ejection fraction of approximately 55%.  5. Recent catheterization with patent coronary arteries.  6. Hypertension.  7. Dyslipidemia.  8. Diabetes mellitus.  9. Mild obstructive sleep apnea with plans for CPAP titration given her      arrhythmia.   RECOMMENDATIONS:  1. Admit to [facility] on the step-down unit with close      monitoring of her rhythm.  2. Have physical therapy, occupational therapy and cardiac rehab involved      early in her admission.  3. No change in current cardiac regimen.  4. Ask Dr. Graciela Husbands for a repeat consultation for her continued complaints.  5. Will also ask Dr. Alanda Amass for his input given her request.  6. Ambulate frequently with monitoring of her rhythm with assistance.  7. Fall precautions given her previous presyncope           ______________________________ Dani Gobble, MD     AB/MEDQ  D:  04/01/2006  T:  04/01/2006  Job:  [id number]   cc:   [name] Salvia, MD, [facility]

## 2011-03-18 ENCOUNTER — Other Ambulatory Visit (HOSPITAL_COMMUNITY): Payer: Self-pay | Admitting: Internal Medicine

## 2011-03-18 DIAGNOSIS — Z139 Encounter for screening, unspecified: Secondary | ICD-10-CM

## 2011-03-21 ENCOUNTER — Ambulatory Visit (HOSPITAL_COMMUNITY): Payer: Medicare Other

## 2011-04-19 LAB — COMPREHENSIVE METABOLIC PANEL
ALT: 14
AST: 18
Albumin: 3 — ABNORMAL LOW
Alkaline Phosphatase: 52
Alkaline Phosphatase: 57
BUN: 14
CO2: 26
Chloride: 101
Chloride: 107
GFR calc Af Amer: 48 — ABNORMAL LOW
GFR calc non Af Amer: >60
Glucose, Bld: 170 — ABNORMAL HIGH
Potassium: 3.4 — ABNORMAL LOW
Potassium: 4
Sodium: 137
Total Bilirubin: 1.3 — ABNORMAL HIGH
Total Bilirubin: 1.2
Total Protein: 6.2
Total Protein: 5.4 — ABNORMAL LOW

## 2011-04-19 LAB — CBC
HCT: 29.8 — ABNORMAL LOW
HCT: 30 — ABNORMAL LOW
HCT: 26.5 — ABNORMAL LOW
HCT: 29.2 — ABNORMAL LOW
Hemoglobin: 10 — ABNORMAL LOW
Hemoglobin: 7.8 — CL
Hemoglobin: 8.9 — ABNORMAL LOW
Hemoglobin: 9.9 — ABNORMAL LOW
MCHC: 33.2
MCHC: 33.3
MCHC: 33.4
MCHC: 34.2
MCHC: 33.3
MCHC: 33.9
MCV: 97.4
MCV: 99.1
MCV: 97.5
MCV: 97.7
MCV: 98.2
Platelets: 283
Platelets: 249
Platelets: 256
Platelets: 270
RBC: 2.29 — ABNORMAL LOW
RBC: 3.03 — ABNORMAL LOW
RBC: 2.99 — ABNORMAL LOW
RDW: 15.8 — ABNORMAL HIGH
RDW: 15.7 — ABNORMAL HIGH
RDW: 16.2 — ABNORMAL HIGH
WBC: 10.9 — ABNORMAL HIGH
WBC: 7.2
WBC: 8.9
WBC: 9.2

## 2011-04-19 LAB — PROTIME-INR
INR: 1.1
Prothrombin Time: 14.5

## 2011-04-19 LAB — CROSSMATCH

## 2011-04-19 LAB — IRON AND TIBC
Iron: 45
UIBC: 253
UIBC: 311

## 2011-04-19 LAB — LIPID PANEL
Cholesterol: 123
Cholesterol: 136
HDL: 52
LDL Cholesterol: 55
Total CHOL/HDL Ratio: 2.2
Total CHOL/HDL Ratio: 2.4
Triglycerides: 81
VLDL: 18

## 2011-04-19 LAB — HEMOGLOBIN A1C
Hgb A1c MFr Bld: 6.3 — ABNORMAL HIGH
Mean Plasma Glucose: 147

## 2011-04-19 LAB — CARDIAC PANEL(CRET KIN+CKTOT+MB+TROPI)
Relative Index: INVALID
Relative Index: INVALID
Total CK: 32
Total CK: 40
Troponin I: 0.03

## 2011-04-19 LAB — BLOOD GAS, ARTERIAL
Acid-Base Excess: 2.2 — ABNORMAL HIGH
Patient temperature: 98.6
TCO2: 27.5
pCO2 arterial: 41.2

## 2011-04-19 LAB — BASIC METABOLIC PANEL
BUN: 10
BUN: 10
CO2: 26
CO2: 28
CO2: 27
Calcium: 8.2 — ABNORMAL LOW
Calcium: 9
Calcium: 8.5
Chloride: 104
Chloride: 106
Creatinine, Ser: 1.06
Creatinine, Ser: 1.25 — ABNORMAL HIGH
GFR calc Af Amer: >60
GFR calc Af Amer: 51 — ABNORMAL LOW
GFR calc non Af Amer: 51 — ABNORMAL LOW
GFR calc non Af Amer: 51 — ABNORMAL LOW
Glucose, Bld: 236 — ABNORMAL HIGH
Glucose, Bld: 140 — ABNORMAL HIGH
Glucose, Bld: 164 — ABNORMAL HIGH
Potassium: 4.2
Sodium: 142
Sodium: 136

## 2011-04-19 LAB — POCT I-STAT 3, ART BLOOD GAS (G3+)
Operator id: 233621
TCO2: 27
pH, Arterial: 7.439 — ABNORMAL HIGH

## 2011-04-19 LAB — POCT I-STAT 3, VENOUS BLOOD GAS (G3P V)
Acid-Base Excess: 2
Operator id: 233621
pO2, Ven: 26 — CL

## 2011-04-19 LAB — CULTURE, BLOOD (ROUTINE X 2)

## 2011-04-19 LAB — MAGNESIUM: Magnesium: 1.6

## 2011-04-19 LAB — D-DIMER, QUANTITATIVE: D-Dimer, Quant: 1.37 — ABNORMAL HIGH

## 2011-04-19 LAB — CK TOTAL AND CKMB (NOT AT ARMC)
Relative Index: INVALID
Total CK: 30

## 2011-04-19 LAB — HEMOGLOBIN AND HEMATOCRIT, BLOOD: HCT: 27.3 — ABNORMAL LOW

## 2011-04-19 LAB — RETICULOCYTES: RBC.: 2.96 — ABNORMAL LOW

## 2011-04-19 LAB — DIFFERENTIAL
Basophils Absolute: 0.2 — ABNORMAL HIGH
Lymphs Abs: 1.2
Monocytes Absolute: 0.6

## 2011-04-19 LAB — FERRITIN: Ferritin: 26 (ref 10–291)

## 2011-04-19 LAB — TROPONIN I: Troponin I: 0.01

## 2011-04-22 LAB — BLOOD GAS, ARTERIAL
Bicarbonate: 24.6 — ABNORMAL HIGH
O2 Saturation: 95.8
Patient temperature: 37
pH, Arterial: 7.435 — ABNORMAL HIGH

## 2011-04-29 ENCOUNTER — Ambulatory Visit (HOSPITAL_COMMUNITY)
Admission: RE | Admit: 2011-04-29 | Discharge: 2011-04-29 | Disposition: A | Discharge status: Home / Self Care | Payer: Medicare Other | Source: Ambulatory Visit | Attending: Internal Medicine | Admitting: Internal Medicine

## 2011-04-29 DIAGNOSIS — Z139 Encounter for screening, unspecified: Secondary | ICD-10-CM

## 2011-04-29 DIAGNOSIS — Z1231 Encounter for screening mammogram for malignant neoplasm of breast: Secondary | ICD-10-CM | POA: Insufficient documentation

## 2011-08-20 DIAGNOSIS — D518 Other vitamin B12 deficiency anemias: Secondary | ICD-10-CM | POA: Diagnosis not present

## 2011-09-05 DIAGNOSIS — I10 Essential (primary) hypertension: Secondary | ICD-10-CM | POA: Diagnosis not present

## 2011-09-05 DIAGNOSIS — I2789 Other specified pulmonary heart diseases: Secondary | ICD-10-CM | POA: Diagnosis not present

## 2011-09-05 DIAGNOSIS — I2510 Atherosclerotic heart disease of native coronary artery without angina pectoris: Secondary | ICD-10-CM | POA: Diagnosis not present

## 2011-09-27 HISTORY — PX: TRANSTHORACIC ECHOCARDIOGRAM: SHX275

## 2011-10-01 DIAGNOSIS — I2789 Other specified pulmonary heart diseases: Secondary | ICD-10-CM | POA: Diagnosis not present

## 2011-10-01 DIAGNOSIS — I2510 Atherosclerotic heart disease of native coronary artery without angina pectoris: Secondary | ICD-10-CM | POA: Diagnosis not present

## 2011-10-01 DIAGNOSIS — R0602 Shortness of breath: Secondary | ICD-10-CM | POA: Diagnosis not present

## 2012-02-17 DIAGNOSIS — I2510 Atherosclerotic heart disease of native coronary artery without angina pectoris: Secondary | ICD-10-CM | POA: Diagnosis not present

## 2012-02-17 DIAGNOSIS — E785 Hyperlipidemia, unspecified: Secondary | ICD-10-CM | POA: Diagnosis not present

## 2012-02-17 DIAGNOSIS — G4700 Insomnia, unspecified: Secondary | ICD-10-CM | POA: Diagnosis not present

## 2012-02-17 DIAGNOSIS — Z6839 Body mass index (BMI) 39.0-39.9, adult: Secondary | ICD-10-CM | POA: Diagnosis not present

## 2012-04-09 DIAGNOSIS — I2789 Other specified pulmonary heart diseases: Secondary | ICD-10-CM | POA: Diagnosis not present

## 2012-04-09 DIAGNOSIS — E669 Obesity, unspecified: Secondary | ICD-10-CM | POA: Diagnosis not present

## 2012-04-09 DIAGNOSIS — G4733 Obstructive sleep apnea (adult) (pediatric): Secondary | ICD-10-CM | POA: Diagnosis not present

## 2012-04-09 DIAGNOSIS — E782 Mixed hyperlipidemia: Secondary | ICD-10-CM | POA: Diagnosis not present

## 2012-05-05 DIAGNOSIS — E119 Type 2 diabetes mellitus without complications: Secondary | ICD-10-CM | POA: Diagnosis not present

## 2012-05-11 DIAGNOSIS — Z23 Encounter for immunization: Secondary | ICD-10-CM | POA: Diagnosis not present

## 2012-08-03 DIAGNOSIS — B029 Zoster without complications: Secondary | ICD-10-CM | POA: Diagnosis not present

## 2012-08-03 DIAGNOSIS — Z6839 Body mass index (BMI) 39.0-39.9, adult: Secondary | ICD-10-CM | POA: Diagnosis not present

## 2012-12-31 ENCOUNTER — Other Ambulatory Visit (HOSPITAL_COMMUNITY): Payer: Self-pay | Admitting: Internal Medicine

## 2012-12-31 DIAGNOSIS — E785 Hyperlipidemia, unspecified: Secondary | ICD-10-CM | POA: Diagnosis not present

## 2012-12-31 DIAGNOSIS — I2510 Atherosclerotic heart disease of native coronary artery without angina pectoris: Secondary | ICD-10-CM | POA: Diagnosis not present

## 2012-12-31 DIAGNOSIS — Z6841 Body Mass Index (BMI) 40.0 and over, adult: Secondary | ICD-10-CM | POA: Diagnosis not present

## 2012-12-31 DIAGNOSIS — Z139 Encounter for screening, unspecified: Secondary | ICD-10-CM

## 2013-01-11 ENCOUNTER — Ambulatory Visit (HOSPITAL_COMMUNITY)
Admission: RE | Admit: 2013-01-11 | Discharge: 2013-01-11 | Disposition: A | Discharge status: Home / Self Care | Payer: Medicare Other | Source: Ambulatory Visit | Attending: Internal Medicine | Admitting: Internal Medicine

## 2013-01-11 DIAGNOSIS — Z139 Encounter for screening, unspecified: Secondary | ICD-10-CM

## 2013-01-11 DIAGNOSIS — Z1231 Encounter for screening mammogram for malignant neoplasm of breast: Secondary | ICD-10-CM | POA: Insufficient documentation

## 2013-02-09 DIAGNOSIS — E119 Type 2 diabetes mellitus without complications: Secondary | ICD-10-CM | POA: Diagnosis not present

## 2013-03-04 ENCOUNTER — Telehealth: Payer: Self-pay | Admitting: *Deleted

## 2013-03-04 NOTE — Telephone Encounter (Signed)
I called and relayed date of next Rituxin infusion 04-07-13.  Said to early to send out.  Will call back in 2 wks.

## 2013-05-03 ENCOUNTER — Telehealth: Payer: Self-pay | Admitting: Internal Medicine

## 2013-05-03 NOTE — Telephone Encounter (Signed)
Pt needs to have a refill of amlodipine 5 mg called into  [facility] in [place], [place]. [phone]

## 2013-05-04 ENCOUNTER — Encounter: Payer: Self-pay | Admitting: *Deleted

## 2013-05-05 ENCOUNTER — Encounter: Payer: Self-pay | Admitting: Internal Medicine

## 2013-05-05 ENCOUNTER — Ambulatory Visit (INDEPENDENT_AMBULATORY_CARE_PROVIDER_SITE_OTHER): Payer: Medicare Other | Admitting: Internal Medicine

## 2013-05-05 VITALS — BP 164/80 | HR 71 | Ht 65.0 in | Wt 219.9 lb

## 2013-05-05 DIAGNOSIS — I2510 Atherosclerotic heart disease of native coronary artery without angina pectoris: Secondary | ICD-10-CM

## 2013-05-05 DIAGNOSIS — G4733 Obstructive sleep apnea (adult) (pediatric): Secondary | ICD-10-CM | POA: Diagnosis not present

## 2013-05-05 DIAGNOSIS — I472 Ventricular tachycardia: Secondary | ICD-10-CM | POA: Diagnosis not present

## 2013-05-05 DIAGNOSIS — R0609 Other forms of dyspnea: Secondary | ICD-10-CM

## 2013-05-05 DIAGNOSIS — I10 Essential (primary) hypertension: Secondary | ICD-10-CM

## 2013-05-05 MED ORDER — AMLODIPINE BESYLATE 5 MG PO TABS: 5.0000 mg | ORAL_TABLET | Freq: Every day | ORAL | Status: AC

## 2013-05-05 NOTE — Progress Notes (Signed)
OFFICE NOTE  Chief Complaint:  Routine follow-up, dyspnea  Primary Care Physician: Cassell Smiles., MD  HPI:  [name] Mueller is a 78 year old female with history of coronary disease, supposedly severe pulmonary hypertension in the past, (but recently on echo, pulmonary pressures were low), anemia, morbid obesity, hypertension, diabetes, nonsustained VT, obstructive sleep apnea and noncompliance with CPAP and other therapies. She has not seen her pulmonologist, Dr. Delton Coombes in a long time. She has been hesitant to wear any oxygen or have any other procedures. She did have a history of coronary disease as well and had a stent to the ramus intermedius in 2004. She continues to complain of shortness of breath. I asked her how frequently she checks her blood pressure, and she said every day. I asked her what it was and then she told me that she might as well be honest with me and tell me that she never checks her blood pressure. Overall, it seems like her health care and medication compliance is quite poor. Today she returns for essentially a refill of her medications. Her main complaint is shortness of breath which he says is stable. She then reported that she felt that the main issue between the 2 of [place] is that I would recommend that she get more exercise but that she really cannot do that do to her shortness of breath. I then inquired about whether she would allow me to do more testing to elicit the cause of her shortness of breath, such as a stress test or a metabolic test, and she declined that. She reported that she's always been told by doctors that there is "nothing else to do", however that is not my feeling. Her shortness of breath is exertional, and not associated with any chest pressure or discomfort. She also reports painful joints.  PMHx:  Past Medical History  Diagnosis Date  . Coronary artery disease     stent to ramus intermedius in 2004  . Diabetes mellitus without complication   .  Hypertension   . Morbid obesity   . NSVT (nonsustained ventricular tachycardia)   . OSA (obstructive sleep apnea)     AHI during total sleep 10.27/hr and REM sleep 40.34/hr  . History of pulmonary hypertension   . Anemia   . Dyslipidemia   . History of nuclear stress test 2004    no significant ischemia   . Arterial insufficiency     mild- 2007 LEAs    Past Surgical History  Procedure Laterality Date  . Coronary angioplasty  01/2003    3.0x38mm TAXUS DES to ramus intermedius  . Transthoracic echocardiogram  09/2011    EF=>55%, mild conc LVH, normal LV systolic function; LA mild-mod dilated; mild mitral annular calcif, mild MR; mild TR & normal RV systolic pressure; trace PVR    FAMHx:  Family History  Problem Relation Age of Onset  . Family history unknown: Yes    SOCHx:   reports that she has never smoked. She has never used smokeless tobacco. She reports that she does not drink alcohol or use illicit drugs.  ALLERGIES:  Allergies  Allergen Reactions  . Ezetimibe   . Lisinopril     REACTION: cough    ROS: A comprehensive review of systems was negative except for: Constitutional: positive for fatigue Respiratory: positive for dyspnea on exertion Musculoskeletal: positive for myalgias  HOME MEDS: Current Outpatient Prescriptions  Medication Sig Dispense Refill  . clopidogrel (PLAVIX) 75 MG tablet Take 75 mg by mouth  daily.      . Cyanocobalamin (VITAMIN B-12 IJ) Inject as directed every 30 (thirty) days.      Marland Kitchen escitalopram (LEXAPRO) 10 MG tablet Take 10 mg by mouth daily.      Marland Kitchen esomeprazole (NEXIUM) 40 MG capsule Take 40 mg by mouth daily before breakfast.      . folic acid (FOLVITE) 1 MG tablet Take 1 mg by mouth daily.      Marland Kitchen glimepiride (AMARYL) 4 MG tablet Take 4 mg by mouth daily before breakfast.      . isosorbide mononitrate (IMDUR) 30 MG 24 hr tablet Take 30 mg by mouth daily.      . metFORMIN (GLUCOPHAGE) 1000 MG tablet Take 1,000 mg by mouth daily with  breakfast.      . metoprolol tartrate (LOPRESSOR) 25 MG tablet Take 25 mg by mouth 2 (two) times daily.      . simvastatin (ZOCOR) 20 MG tablet Take 20 mg by mouth every evening.      . topiramate (TOPAMAX) 25 MG tablet Take 25 mg by mouth 2 (two) times daily.      . valsartan (DIOVAN) 160 MG tablet Take 160 mg by mouth daily.      Marland Kitchen zolpidem (AMBIEN) 5 MG tablet Take 5 mg by mouth at bedtime as needed for sleep.      Marland Kitchen amLODipine (NORVASC) 5 MG tablet Take 1 tablet (5 mg total) by mouth daily.  90 tablet  3   No current facility-administered medications for this visit.    LABS/IMAGING: No results found for this or any previous visit (from the past 48 hour(s)). No results found.  VITALS: BP 164/80  Pulse 71  Ht 5\' 5"  (1.651 m)  Wt 219 lb 14.4 oz (99.746 kg)  BMI 36.59 kg/m2  EXAM: General appearance: alert and no distress Lungs: diminished breath sounds bilaterally Heart: regular rate and rhythm Abdomen: soft, non-tender; bowel sounds normal; no masses,  no organomegaly and morbidly obese Extremities: extremities normal, atraumatic, no cyanosis or edema Pulses: 2+ and symmetric Skin: Skin color, texture, turgor normal. No rashes or lesions Neurologic: Grossly normal  EKG: Normal sinus rhythm at 71  ASSESSMENT: 1. Coronary artery disease status post PCI to the ramus intermedius in 2004 2. Obesity 3. Obstructive sleep apnea-noncompliant 4. History of some nonsustained VT 5. History of orthostatic hypotension by tilt table testing 6. Anemia 7. Noncompliance 8. History of severe pulmonary hypertension with normal pulmonary pressures at this time 9. Dyspnea and exertion  PLAN: 1.   [name] has ongoing complaints of dyspnea on exertion. I've suggested further workup which may include a repeat echocardiogram, stress test or metabolic testing. She declines any further testing due to her concern about not being able to walk and how short of breath she is. I explained to her at  length about how the tests are necessary to help figure out why she is short of breath. Despite this, she wishes to think about whether she wants further testing. I did encourage her to use her CPAP device, which she reports her husband is able to use and can't sleep without. She reports being very claustrophobic and does not want to wear it. I also encouraged her to continue to get exercise, however she says that she cannot due to the fact that she is so short of breath. I explained her that I would be happy to continue to work to see if we can improve her shortness of breath, but that  would require her to consent to further testing. I asked her to contact me back if she should desire any further testing to continue to try to work up causes of her shortness of breath.  Chrystie Nose, MD, [facility] Attending Cardiologist [facility]  HILTY,Kenneth C 05/05/2013, 5:06 PM

## 2013-05-05 NOTE — Patient Instructions (Signed)
Your physician wants you to follow-up in: 1 year. You will receive a reminder letter in the mail two months in advance. If you don't receive a letter, please call our office to schedule the follow-up appointment.  

## 2013-05-06 ENCOUNTER — Encounter: Payer: Self-pay | Admitting: Internal Medicine

## 2013-05-06 NOTE — Telephone Encounter (Signed)
New prescription was sent in to the pharmacy at her office visit on 05/05/2013.

## 2013-08-31 DIAGNOSIS — Z6837 Body mass index (BMI) 37.0-37.9, adult: Secondary | ICD-10-CM | POA: Diagnosis not present

## 2013-08-31 DIAGNOSIS — I2510 Atherosclerotic heart disease of native coronary artery without angina pectoris: Secondary | ICD-10-CM | POA: Diagnosis not present

## 2013-08-31 DIAGNOSIS — D510 Vitamin B12 deficiency anemia due to intrinsic factor deficiency: Secondary | ICD-10-CM | POA: Diagnosis not present

## 2013-08-31 DIAGNOSIS — G4700 Insomnia, unspecified: Secondary | ICD-10-CM | POA: Diagnosis not present

## 2014-04-21 DIAGNOSIS — E119 Type 2 diabetes mellitus without complications: Secondary | ICD-10-CM | POA: Diagnosis not present

## 2014-04-21 DIAGNOSIS — Z23 Encounter for immunization: Secondary | ICD-10-CM | POA: Diagnosis not present

## 2014-04-21 DIAGNOSIS — K219 Gastro-esophageal reflux disease without esophagitis: Secondary | ICD-10-CM | POA: Diagnosis not present

## 2014-04-21 DIAGNOSIS — I10 Essential (primary) hypertension: Secondary | ICD-10-CM | POA: Diagnosis not present

## 2014-04-21 DIAGNOSIS — Z79899 Other long term (current) drug therapy: Secondary | ICD-10-CM | POA: Diagnosis not present

## 2014-04-21 DIAGNOSIS — Z6834 Body mass index (BMI) 34.0-34.9, adult: Secondary | ICD-10-CM | POA: Diagnosis not present

## 2014-10-29 ENCOUNTER — Encounter (HOSPITAL_COMMUNITY): Payer: Self-pay | Admitting: Emergency Medicine

## 2014-10-29 ENCOUNTER — Emergency Department (HOSPITAL_COMMUNITY)
Admission: EM | Admit: 2014-10-29 | Discharge: 2014-10-29 | Discharge status: Against Medical Advice | Payer: Medicare Other | Attending: Emergency Medicine | Admitting: Emergency Medicine

## 2014-10-29 ENCOUNTER — Emergency Department (HOSPITAL_COMMUNITY): Payer: Medicare Other

## 2014-10-29 DIAGNOSIS — E11649 Type 2 diabetes mellitus with hypoglycemia without coma: Secondary | ICD-10-CM | POA: Insufficient documentation

## 2014-10-29 DIAGNOSIS — E785 Hyperlipidemia, unspecified: Secondary | ICD-10-CM | POA: Insufficient documentation

## 2014-10-29 DIAGNOSIS — E162 Hypoglycemia, unspecified: Secondary | ICD-10-CM

## 2014-10-29 DIAGNOSIS — Z7902 Long term (current) use of antithrombotics/antiplatelets: Secondary | ICD-10-CM | POA: Diagnosis not present

## 2014-10-29 DIAGNOSIS — R05 Cough: Secondary | ICD-10-CM | POA: Diagnosis not present

## 2014-10-29 DIAGNOSIS — I10 Essential (primary) hypertension: Secondary | ICD-10-CM | POA: Insufficient documentation

## 2014-10-29 DIAGNOSIS — Z79899 Other long term (current) drug therapy: Secondary | ICD-10-CM | POA: Insufficient documentation

## 2014-10-29 DIAGNOSIS — Z9861 Coronary angioplasty status: Secondary | ICD-10-CM | POA: Insufficient documentation

## 2014-10-29 DIAGNOSIS — I2510 Atherosclerotic heart disease of native coronary artery without angina pectoris: Secondary | ICD-10-CM | POA: Insufficient documentation

## 2014-10-29 DIAGNOSIS — Z862 Personal history of diseases of the blood and blood-forming organs and certain disorders involving the immune mechanism: Secondary | ICD-10-CM | POA: Insufficient documentation

## 2014-10-29 DIAGNOSIS — Z8669 Personal history of other diseases of the nervous system and sense organs: Secondary | ICD-10-CM | POA: Insufficient documentation

## 2014-10-29 DIAGNOSIS — R410 Disorientation, unspecified: Secondary | ICD-10-CM | POA: Diagnosis not present

## 2014-10-29 LAB — CBC WITH DIFFERENTIAL/PLATELET
BASOS PCT: 1 % (ref 0–1)
Basophils Absolute: 0.1 10*3/uL (ref 0.0–0.1)
EOS ABS: 0 10*3/uL (ref 0.0–0.7)
EOS PCT: 0 % (ref 0–5)
HCT: 36.7 % (ref 36.0–46.0)
HEMOGLOBIN: 12.5 g/dL (ref 12.0–15.0)
LYMPHS PCT: 4 % — AB (ref 12–46)
Lymphs Abs: 0.4 10*3/uL — ABNORMAL LOW (ref 0.7–4.0)
MCH: 31.4 pg (ref 26.0–34.0)
MCHC: 34.1 g/dL (ref 30.0–36.0)
MCV: 92.2 fL (ref 78.0–100.0)
MONO ABS: 0.8 10*3/uL (ref 0.1–1.0)
Monocytes Relative: 8 % (ref 3–12)
Neutro Abs: 9.3 10*3/uL — ABNORMAL HIGH (ref 1.7–7.7)
Neutrophils Relative %: 87 % — ABNORMAL HIGH (ref 43–77)
PLATELETS: 300 10*3/uL (ref 150–400)
RBC: 3.98 MIL/uL (ref 3.87–5.11)
RDW: 12.7 % (ref 11.5–15.5)
WBC: 10.6 10*3/uL — ABNORMAL HIGH (ref 4.0–10.5)

## 2014-10-29 LAB — URINALYSIS, ROUTINE W REFLEX MICROSCOPIC
BILIRUBIN URINE: NEGATIVE
Glucose, UA: 100 mg/dL — AB
Hgb urine dipstick: NEGATIVE
Ketones, ur: NEGATIVE mg/dL
Leukocytes, UA: NEGATIVE
NITRITE: NEGATIVE
PROTEIN: NEGATIVE mg/dL
SPECIFIC GRAVITY, URINE: 1.015 (ref 1.005–1.030)
Urobilinogen, UA: 0.2 mg/dL (ref 0.0–1.0)
pH: 6.5 (ref 5.0–8.0)

## 2014-10-29 LAB — BASIC METABOLIC PANEL
ANION GAP: 8 (ref 5–15)
BUN: 14 mg/dL (ref 6–23)
CALCIUM: 8.6 mg/dL (ref 8.4–10.5)
CO2: 23 mmol/L (ref 19–32)
Chloride: 107 mmol/L (ref 96–112)
Creatinine, Ser: 1.03 mg/dL (ref 0.50–1.10)
GFR calc Af Amer: 59 mL/min — ABNORMAL LOW (ref >90)
GFR calc non Af Amer: 51 mL/min — ABNORMAL LOW (ref >90)
Glucose, Bld: 157 mg/dL — ABNORMAL HIGH (ref 70–99)
Potassium: 3.4 mmol/L — ABNORMAL LOW (ref 3.5–5.1)
Sodium: 138 mmol/L (ref 135–145)

## 2014-10-29 LAB — CBG MONITORING, ED: Glucose-Capillary: 162 mg/dL — ABNORMAL HIGH (ref 70–99)

## 2014-10-29 LAB — TROPONIN I: Troponin I: <0.03 ng/mL (ref <0.031)

## 2014-10-29 LAB — LACTIC ACID, PLASMA: LACTIC ACID, VENOUS: 1.4 mmol/L (ref 0.5–2.0)

## 2014-10-29 NOTE — ED Notes (Signed)
Pt found unresponsive via family.  Glucose 31 upon EMS.  After being medicated by EMS glucose increased to 141 in route.  Currently glucose 124.

## 2014-10-29 NOTE — ED Provider Notes (Signed)
CSN: [id number]     Arrival date & time 10/29/14  1353 History   First MD Initiated Contact with Patient 10/29/14 1411     Chief Complaint  Patient presents with  . Hypoglycemia      HPI Pt was seen at 1410. Per EMS, pt's spouse and pt report: c/o unknown onset and resolution of one episode of "unresponsiveness" that occurred PTA. Pt's husband states he woke up at 0800 and "she didn't wake up," so he "let her stay in bed and sleep." Pt's husband checked on her again at noon and still could not wake her, so he called 911. EMS noted pt was unresponsive on their arrival to scene, CBG 31. EMS gave glucose with CBG increasing to 141 and pt becoming awake/alert. Pt states she took her DM meds last night and "didn't eat much" before she went to bed. Denies any complaints currently. Denies CP/SOB, no abd pain, no N/V/D. No reported apnea or pulselessness.   Past Medical History  Diagnosis Date  . Coronary artery disease     stent to ramus intermedius in 2004  . Diabetes mellitus without complication   . Hypertension   . Morbid obesity   . NSVT (nonsustained ventricular tachycardia)   . OSA (obstructive sleep apnea)     AHI during total sleep 10.27/hr and REM sleep 40.34/hr  . History of pulmonary hypertension   . Anemia   . Dyslipidemia   . History of nuclear stress test 2004    no significant ischemia   . Arterial insufficiency     mild- 2007 LEAs   Past Surgical History  Procedure Laterality Date  . Coronary angioplasty  01/2003    3.0x28mm TAXUS DES to ramus intermedius  . Transthoracic echocardiogram  09/2011    EF=>55%, mild conc LVH, normal LV systolic function; LA mild-mod dilated; mild mitral annular calcif, mild MR; mild TR & normal RV systolic pressure; trace PVR   Family History  Problem Relation Age of Onset  . Family history unknown: Yes   History  Substance Use Topics  . Smoking status: Never Smoker   . Smokeless tobacco: Never Used  . Alcohol Use: No    Review of  Systems ROS: Statement: All systems negative except as marked or noted in the HPI; Constitutional: Negative for fever and chills. ; ; Eyes: Negative for eye pain, redness and discharge. ; ; ENMT: Negative for ear pain, hoarseness, nasal congestion, sinus pressure and sore throat. ; ; Cardiovascular: Negative for chest pain, palpitations, diaphoresis, dyspnea and peripheral edema. ; ; Respiratory: Negative for cough, wheezing and stridor. ; ; Gastrointestinal: Negative for nausea, vomiting, diarrhea, abdominal pain, blood in stool, hematemesis, jaundice and rectal bleeding. . ; ; Genitourinary: Negative for dysuria, flank pain and hematuria. ; ; Musculoskeletal: Negative for back pain and neck pain. Negative for swelling and trauma.; ; Skin: Negative for pruritus, rash, abrasions, blisters, bruising and skin lesion.; ; Neuro: +unresponsiveness. Negative for headache, lightheadedness and neck stiffness. Negative for weakness, extremity weakness, paresthesias, involuntary movement, seizure.     Allergies  Ezetimibe and Lisinopril  Home Medications   Prior to Admission medications   Medication Sig Start Date End Date Taking? Authorizing Provider  amLODipine (NORVASC) 5 MG tablet Take 1 tablet (5 mg total) by mouth daily. 05/05/13  Yes Chrystie Nose, MD  clopidogrel (PLAVIX) 75 MG tablet Take 75 mg by mouth daily. 04/12/13  Yes Historical Provider, MD  escitalopram (LEXAPRO) 10 MG tablet Take 10 mg by mouth  daily.   Yes Historical Provider, MD  esomeprazole (NEXIUM) 40 MG capsule Take 40 mg by mouth daily before breakfast.   Yes Historical Provider, MD  folic acid (FOLVITE) 1 MG tablet Take 1 mg by mouth daily.   Yes Historical Provider, MD  glimepiride (AMARYL) 4 MG tablet Take 4 mg by mouth daily before breakfast.   Yes Historical Provider, MD  isosorbide mononitrate (IMDUR) 30 MG 24 hr tablet Take 30 mg by mouth daily.   Yes Historical Provider, MD  metFORMIN (GLUCOPHAGE) 1000 MG tablet Take 1,000 mg  by mouth 2 (two) times daily with a meal.    Yes Historical Provider, MD  metoprolol (LOPRESSOR) 50 MG tablet Take 25 mg by mouth 2 (two) times daily.   Yes Historical Provider, MD  simvastatin (ZOCOR) 20 MG tablet Take 20 mg by mouth every evening.   Yes Historical Provider, MD  topiramate (TOPAMAX) 25 MG tablet Take 25 mg by mouth 2 (two) times daily.   Yes Historical Provider, MD  valsartan (DIOVAN) 160 MG tablet Take 160 mg by mouth daily.   Yes Historical Provider, MD  zolpidem (AMBIEN) 5 MG tablet Take 5 mg by mouth at bedtime as needed for sleep.   Yes Historical Provider, MD  Cyanocobalamin (VITAMIN B-12 IJ) Inject as directed every 30 (thirty) days.    Historical Provider, MD   BP 155/68 mmHg  Pulse 80  Temp(Src) 98.5 F (36.9 C) (Oral)  Resp 18  Ht 5\' 3"  (1.6 m)  Wt 219 lb (99.338 kg)  BMI 38.80 kg/m2  SpO2 100% Physical Exam  1415: Physical examination:  Nursing notes reviewed; Vital signs and O2 SAT reviewed;  Constitutional: Well developed, Well nourished, Well hydrated, In no acute distress; Head:  Normocephalic, atraumatic; Eyes: EOMI, PERRL, No scleral icterus; ENMT: Mouth and pharynx normal, Mucous membranes moist; Neck: Supple, Full range of motion, No lymphadenopathy; Cardiovascular: Regular rate and rhythm, No gallop; Respiratory: Breath sounds clear & equal bilaterally, No wheezes.  Speaking full sentences with ease, Normal respiratory effort/excursion; Chest: Nontender, Movement normal; Abdomen: Soft, Nontender, Nondistended, Normal bowel sounds; Genitourinary: No CVA tenderness. +smells strongly of urine.; Extremities: Pulses normal, No tenderness, No edema, No calf edema or asymmetry.; Neuro: AA&Ox3, Major CN grossly intact.  Speech clear. No gross focal motor or sensory deficits in extremities.; Skin: Color normal, Warm, Dry.   ED Course  Procedures     EKG Interpretation None      MDM  MDM Reviewed: previous chart, nursing note and vitals Reviewed previous:  labs and ECG Interpretation: labs, x-ray and ECG     Results for orders placed or performed during the hospital encounter of 10/29/14  Urinalysis, Routine w reflex microscopic  Result Value Ref Range   Color, Urine YELLOW YELLOW   APPearance CLEAR CLEAR   Specific Gravity, Urine 1.015 1.005 - 1.030   pH 6.5 5.0 - 8.0   Glucose, UA 100 (A) NEGATIVE mg/dL   Hgb urine dipstick NEGATIVE NEGATIVE   Bilirubin Urine NEGATIVE NEGATIVE   Ketones, ur NEGATIVE NEGATIVE mg/dL   Protein, ur NEGATIVE NEGATIVE mg/dL   Urobilinogen, UA 0.2 0.0 - 1.0 mg/dL   Nitrite NEGATIVE NEGATIVE   Leukocytes, UA NEGATIVE NEGATIVE  Lactic acid, plasma  Result Value Ref Range   Lactic Acid, Venous 1.4 0.5 - 2.0 mmol/L  Basic metabolic panel  Result Value Ref Range   Sodium 138 135 - 145 mmol/L   Potassium 3.4 (L) 3.5 - 5.1 mmol/L   Chloride 107  96 - 112 mmol/L   CO2 23 19 - 32 mmol/L   Glucose, Bld 157 (H) 70 - 99 mg/dL   BUN 14 6 - 23 mg/dL   Creatinine, Ser 1.61 0.50 - 1.10 mg/dL   Calcium 8.6 8.4 - 09.6 mg/dL   GFR calc non Af Amer 51 (L) >90 mL/min   GFR calc Af Amer 59 (L) >90 mL/min   Anion gap 8 5 - 15  CBC with Differential  Result Value Ref Range   WBC 10.6 (H) 4.0 - 10.5 K/uL   RBC 3.98 3.87 - 5.11 MIL/uL   Hemoglobin 12.5 12.0 - 15.0 g/dL   HCT 04.5 40.9 - 81.1 %   MCV 92.2 78.0 - 100.0 fL   MCH 31.4 26.0 - 34.0 pg   MCHC 34.1 30.0 - 36.0 g/dL   RDW 91.4 78.2 - 95.6 %   Platelets 300 150 - 400 K/uL   Neutrophils Relative % 87 (H) 43 - 77 %   Neutro Abs 9.3 (H) 1.7 - 7.7 K/uL   Lymphocytes Relative 4 (L) 12 - 46 %   Lymphs Abs 0.4 (L) 0.7 - 4.0 K/uL   Monocytes Relative 8 3 - 12 %   Monocytes Absolute 0.8 0.1 - 1.0 K/uL   Eosinophils Relative 0 0 - 5 %   Eosinophils Absolute 0.0 0.0 - 0.7 K/uL   Basophils Relative 1 0 - 1 %   Basophils Absolute 0.1 0.0 - 0.1 K/uL  Troponin I  Result Value Ref Range   Troponin I <0.03 <0.031 ng/mL  CBG monitoring, ED  Result Value Ref Range    Glucose-Capillary 162 (H) 70 - 99 mg/dL   Dg Chest 2 View 08/29/3084   CLINICAL DATA:  Cough for 3 days, unresponsive  EXAM: CHEST  2 VIEW  COMPARISON:  05/03/2010  FINDINGS: The heart size is at upper limits of normal. Trace pleural effusions are noted. Bones are subjectively osteopenic without compression deformity. Both lungs are clear. The visualized skeletal structures are unremarkable.  IMPRESSION: Trace pleural effusions with borderline cardiomegaly. No focal acute finding.   Electronically Signed   By: Christiana Pellant M.D.   On: 10/29/2014 15:38    1620:  Pt hypoglycemic on sulfonylurea; admission recommended for further observation.  Pt adamantly refuses admission, stating she "didn't even want to come here at all."  I encouraged pt to stay, continues to refuse.  Pt makes her own medical decisions.  Risks of AMA explained to pt and family, including, but not limited to:  stroke, heart attack, cardiac arrythmia ("irregular heart rate/beat"), "passing out," temporary and/or permanent disability, coma, death.  Pt and family verb understanding and continue to refuse admission, understanding the consequences of their decision.  I encouraged pt to hold her glimepiride tonight, eat and check her CBG's regularly, follow up with her PMD on Monday and return to the ED immediately if symptoms return, or for any other concerns.  Pt and family verb understanding, agreeable.   Samuel Jester, DO 10/31/14 2051

## 2014-10-29 NOTE — ED Notes (Signed)
MD at bedside. 

## 2014-10-29 NOTE — Discharge Instructions (Signed)
°Emergency Department Resource Guide °1) Find a Doctor and Pay Out of Pocket °Although you won't have to find out who is covered by your insurance plan, it is a good idea to ask around and get recommendations. You will then need to call the office and see if the doctor you have chosen will accept you as a new patient and what types of options they offer for patients who are self-pay. Some doctors offer discounts or will set up payment plans for their patients who do not have insurance, but you will need to ask so you aren't surprised when you get to your appointment. ° °2) Contact Your Local Health Department °Not all health departments have doctors that can see patients for sick visits, but many do, so it is worth a call to see if yours does. If you don't know where your local health department is, you can check in your phone book. The CDC also has a tool to help you locate your state's health department, and many state websites also have listings of all of their local health departments. ° °3) Find a Walk-in Clinic °If your illness is not likely to be very severe or complicated, you may want to try a walk in clinic. These are popping up all over the country in pharmacies, drugstores, and shopping centers. They're usually staffed by nurse practitioners or physician assistants that have been trained to treat common illnesses and complaints. They're usually fairly quick and inexpensive. However, if you have serious medical issues or chronic medical problems, these are probably not your best option. ° °No Primary Care Doctor: °- Call [facility] at  832-8000 - they can help you locate a primary care doctor that  accepts your insurance, provides certain services, etc. °- Physician Referral Service- 1-800-533-3463 ° °Chronic Pain Problems: °Organization         Address  Phone   Notes  °[facility]  ([phone]) [phone] Patients need to be referred by their primary care doctor.  ° °Medication  Assistance: °Organization         Address  Phone   Notes  °[place] [facility] [address] °[place], [place] [address] (336) 641-8030 --Must be a resident of [place] °-- Must have NO insurance coverage whatsoever (no Medicaid/ Medicare, etc.) °-- The pt. MUST have a primary care doctor that directs their care regularly and follows them in the community °  °[facility]  (866) 331-1348   °[employer]  (888) 892-1162   ° °Agencies that provide inexpensive medical care: °Organization         Address  Phone   Notes  °[facility]  (336) 832-8035   °[facility]    (336) 832-7272   °[facility] [address] °[place], [place] [address] (336) 832-4777   °[facility] [address], °[place] (336) 271-4999   °[facility]    (336) 373-0678   °[facility]    (336) 272-1050   °[facility] ° [address], [place] Phone:  (336) 832-4444, Fax:  (336) 832-4440 Hours of Operation:  9 am - 6 pm, M-F.  Also accepts Medicaid/Medicare and self-pay.  °[facility] ° [address], [place] Phone: (336) [phone], Fax: (336) [phone]. Hours of Operation:  8:30 am - 5:30 pm, M-F.  Also accepts Medicaid and self-pay.  °[facility] [place] [address], [place] Phone: (336) 878-6027   °[facility] [address], [place], [place] (336)723-1848, Ext. 123 Mondays & Thursdays: 7-9 AM.  First 15 patients are seen on a first come, first serve basis. °  ° °Medicaid-accepting [place] Providers: ° °Organization         Address  Phone   Notes  °[facility] [address], [place] (336) 641-2100 Also accepts self-pay patients.  °[facility] [address], [place] ° (336) 856-9996   °[facility] [address], [place]  (336) 288-8857   °[facility] [address] [place] [address], [place] (336) 299-7000   °Veita Bland [address], [place]  ° (336) 373-1557 Only accepts [place] Access Medicaid patients after they have their name applied to their card.  ° °Self-Pay (no insurance) in [place]: ° °Organization         Address  Phone   Notes  °Sickle Cell Patients, [facility] [address], [place] (336) 832-1970   °[facility] [address], [place] (336) 832-4400   °[facility] ° [address], [place] (336) 992-4800   °[facility]/Dr. Osei-Bonsu ° [address] [place] [address], [place] or [address], [place] (336) [phone] Phone number for both [place] and [place] locations is the same.  °[facility] [address], [place] (336) 299-0000   °[facility] [address] [place] [address], [place] or [address] (336) 852-7530 °(336) 878-2260   °[facility] [address], [place] (336) 350-1642, phone; (336) 294-5005, fax Sees patients 1st and 3rd Saturday of every month.  Must not qualify for public or private insurance (i.e. Medicaid, Medicare, [place] Health Choice, Veterans' Benefits) • Household income should be no more than 200% of the poverty level •The clinic cannot treat you if you are pregnant or think you are pregnant • Sexually transmitted diseases are not treated at the clinic.  ° ° °Dental Care: °Organization         Address  Phone  Notes  °[place] [facility] [address], [place] (336) 641-6152 Accepts children up to age 21 who are enrolled in Medicaid or [place] Health Choice; pregnant women with a Medicaid card; and children who have applied for Medicaid or [place] Health Choice, but were declined, whose parents can pay a reduced fee at time of service.  °[place]  [facility] [place]  [address], [place] (336) 641-7733 Accepts children up to age 21 who are enrolled in Medicaid or [place] Health Choice; pregnant women with a Medicaid card; and children who have applied for Medicaid or [place] Health Choice, but were declined, whose parents can pay a reduced fee at time of service.  °[facility] ° [address], [place] (336) 641-4533 Patients are seen by appointment only. Walk-ins are not accepted. [facility] will see patients 18 years of age and older. °Monday - Tuesday (8am-5pm) °Most Wednesdays (8:30-5pm) °$30 per visit, cash only  °[facility] ° [address], [place] (336) 641-4533 Patients are seen by appointment only. Walk-ins are not accepted. [facility] will see patients 18 years of age and older. °One   Wednesday Evening (Monthly: Volunteer Based).  $30 per visit, cash only  °[facility]  (919) 537-3737 for adults; Children under age 4, call Graduate Pediatric Dentistry at ([phone]) [phone]. Children aged 4-14, please call (919) [phone] to request a pediatric application. ° Dental services are provided in all areas of dental care including fillings, crowns and bridges, complete and partial dentures, implants, gum treatment, root canals, and extractions. Preventive care is also provided. Treatment is provided to both adults and children. °Patients are selected via a lottery and there is often a waiting list. °  °[facility] [address], °[place] ° (336) 763-8833 [email or web address] °  °[facility] [address], [place], [place] (336)723-1848, Ext. 123 Second and Fourth Thursday of each month, opens at 6:30 AM; Clinic ends at 9 AM.  Patients are seen on a first-come first-served basis, and a limited number are seen during each clinic.  ° °[facility] ° [address], [place], [place] (336) 723-7904    Eligibility Requirements °You must have lived in [place], [place], or [place] counties for at least the last three months. °  You cannot be eligible for state or federal sponsored healthcare insurance, including Veterans Administration, Medicaid, or Medicare. °  You generally cannot be eligible for healthcare insurance through your employer.  °  How to apply: °Eligibility screenings are held every Tuesday and Wednesday afternoon from 1:00 pm until 4:00 pm. You do not need an appointment for the interview!  °[facility] [address], [place], [place] 336-631-2330   °[facility]  336-342-8273   °[facility]  336-703-3100   °[facility]  336-570-6415   ° °Behavioral Health Resources in the Community: °Intensive Outpatient Programs °Organization         Address  Phone  Notes  °[facility] [address], [place], [place] 336-878-6098   °[facility] [address], [place], [place] 336-832-9800   °[facility] [address], [place], [place] ° 336-882-2125   °[facility] [address],  °[place], [place] 1-800-853-5163 or 336-641-4981   °Substance Abuse Resources °Organization         Address  Phone  Notes  °[facility]  336-882-2125   °[facility]  336-784-9470   °[facility]  336-285-9073   °[facility]  336-845-3988   °Residential & Outpatient Substance Abuse Program  1-800-659-3381   °Psychological Services °Organization         Address  Phone  Notes  °[facility]  336- 832-9600   °[facility]  336- 378-7881   °[facility] [address], [place] 1-800-853-5163 or 336-641-4981   ° °Mobile Crisis Teams °Organization         Address  Phone  Notes  °[facility], Mobile Crisis Care Unit  1-877-626-1772   °Assertive °[facility] ° [address]  [place], [place] 336-834-9664   °Sharon DeEsch [address] °[place] 336-554-5454   ° °Self-Help/Support Groups °Organization         Address  Phone             Notes  °[facility] of  - variety of support groups  336- 373-1402 Call for more information  °Narcotics Anonymous (NA), [facility] [address], °[place]  2 meetings at this location  ° °  Residential Treatment Programs Organization         Address  Phone  Notes  [facility] [address],    [place]  [phone]   [facility]  [address], [place] [phone], [place], [place] [phone]   [facility] [address], [place] [phone] Admissions: 8am-3pm M-F  [facility] [address]    [place], [place] [phone]   [facility] [address], [place], [place] [phone]   [facility] [address]  [place], [place] [phone]   [facility] Intensive Outpatient [address], [place], [place] [phone]   [facility] [address]  [place], [place] [phone] or [phone](301)[phone]   [facility] ([facility] [address] [place], [place] [phone] Accepts Medicaid  [facility] [address]  [place] [phone] Substance Abuse/Addiction Treatment   [facility]         Address  Phone  Notes  [facility]  669-450-1332(888) 2316607031   Angie FavaJulie Brannon, PhD [address], [address] [place], [place]   (941) [phone](336) [phone] or [phone](336) [phone]   [facility]   [address] [place], [place] [phone](336) [phone]   [facility] [address], [place], [place] [phone](336) [phone] Insurance/Medicaid/sponsorship through [facility]  [facility] [address]                                    [place], [place] [phone](336) [phone] Therapy/tele-psych/case    [facility] [address]   [place], [place] [phone](336) [phone]    Dr. Lolly MustacheArfeen  [phone](336) [phone]   [facility]  [employer] [facility] 1) [address], [place] 2) [address], [place] 3)  [address], [place] [phone](336) [phone](336) [phone]  [phone](336) [phone]   [facility] Child Abuse Hotline 9857819534(336) (660) 685-4061 or [phone](336) [phone] (After Hours)      Eat regularly and check your glucose several times per day. Hold your Amaryl (glimepiride) tonight.  Call your regular medical doctor on Monday to schedule a follow up appointment on Monday.  Return to the Emergency Department immediately sooner if worsening.

## 2014-10-29 NOTE — ED Notes (Signed)
Pt voided

## 2014-10-31 LAB — URINE CULTURE: Colony Count: 15000

## 2014-10-31 LAB — CBG MONITORING, ED: Glucose-Capillary: 124 mg/dL — ABNORMAL HIGH (ref 70–99)

## 2015-01-20 DIAGNOSIS — I10 Essential (primary) hypertension: Secondary | ICD-10-CM | POA: Diagnosis not present

## 2015-01-20 DIAGNOSIS — Z1389 Encounter for screening for other disorder: Secondary | ICD-10-CM | POA: Diagnosis not present

## 2015-01-20 DIAGNOSIS — E538 Deficiency of other specified B group vitamins: Secondary | ICD-10-CM | POA: Diagnosis not present

## 2015-01-20 DIAGNOSIS — E6609 Other obesity due to excess calories: Secondary | ICD-10-CM | POA: Diagnosis not present

## 2015-01-20 DIAGNOSIS — E782 Mixed hyperlipidemia: Secondary | ICD-10-CM | POA: Diagnosis not present

## 2015-01-20 DIAGNOSIS — E669 Obesity, unspecified: Secondary | ICD-10-CM | POA: Diagnosis not present

## 2015-01-20 DIAGNOSIS — E119 Type 2 diabetes mellitus without complications: Secondary | ICD-10-CM | POA: Diagnosis not present

## 2015-01-20 DIAGNOSIS — Z6835 Body mass index (BMI) 35.0-35.9, adult: Secondary | ICD-10-CM | POA: Diagnosis not present

## 2015-05-30 ENCOUNTER — Emergency Department (HOSPITAL_COMMUNITY): Payer: Medicare Other

## 2015-05-30 ENCOUNTER — Encounter (HOSPITAL_COMMUNITY): Payer: Self-pay | Admitting: Emergency Medicine

## 2015-05-30 ENCOUNTER — Inpatient Hospital Stay (HOSPITAL_COMMUNITY)
Admission: EM | Admit: 2015-05-30 | Discharge: 2015-06-04 | DRG: 871 | Disposition: A | Discharge status: Home Health | Payer: Medicare Other | Attending: Internal Medicine | Admitting: Internal Medicine

## 2015-05-30 ENCOUNTER — Inpatient Hospital Stay (HOSPITAL_COMMUNITY): Payer: Medicare Other

## 2015-05-30 DIAGNOSIS — A419 Sepsis, unspecified organism: Principal | ICD-10-CM | POA: Diagnosis present

## 2015-05-30 DIAGNOSIS — R0602 Shortness of breath: Secondary | ICD-10-CM | POA: Diagnosis not present

## 2015-05-30 DIAGNOSIS — E785 Hyperlipidemia, unspecified: Secondary | ICD-10-CM | POA: Diagnosis present

## 2015-05-30 DIAGNOSIS — I272 Other secondary pulmonary hypertension: Secondary | ICD-10-CM | POA: Diagnosis present

## 2015-05-30 DIAGNOSIS — J9600 Acute respiratory failure, unspecified whether with hypoxia or hypercapnia: Secondary | ICD-10-CM | POA: Diagnosis not present

## 2015-05-30 DIAGNOSIS — G4733 Obstructive sleep apnea (adult) (pediatric): Secondary | ICD-10-CM | POA: Diagnosis not present

## 2015-05-30 DIAGNOSIS — R079 Chest pain, unspecified: Secondary | ICD-10-CM | POA: Diagnosis not present

## 2015-05-30 DIAGNOSIS — Z9119 Patient's noncompliance with other medical treatment and regimen: Secondary | ICD-10-CM | POA: Diagnosis not present

## 2015-05-30 DIAGNOSIS — G9341 Metabolic encephalopathy: Secondary | ICD-10-CM | POA: Diagnosis not present

## 2015-05-30 DIAGNOSIS — I10 Essential (primary) hypertension: Secondary | ICD-10-CM | POA: Diagnosis present

## 2015-05-30 DIAGNOSIS — J189 Pneumonia, unspecified organism: Secondary | ICD-10-CM | POA: Diagnosis not present

## 2015-05-30 DIAGNOSIS — R0902 Hypoxemia: Secondary | ICD-10-CM

## 2015-05-30 DIAGNOSIS — R0600 Dyspnea, unspecified: Secondary | ICD-10-CM | POA: Diagnosis not present

## 2015-05-30 DIAGNOSIS — J9691 Respiratory failure, unspecified with hypoxia: Secondary | ICD-10-CM | POA: Insufficient documentation

## 2015-05-30 DIAGNOSIS — E6609 Other obesity due to excess calories: Secondary | ICD-10-CM | POA: Diagnosis not present

## 2015-05-30 DIAGNOSIS — Z6833 Body mass index (BMI) 33.0-33.9, adult: Secondary | ICD-10-CM

## 2015-05-30 DIAGNOSIS — J9601 Acute respiratory failure with hypoxia: Secondary | ICD-10-CM | POA: Diagnosis not present

## 2015-05-30 DIAGNOSIS — I2510 Atherosclerotic heart disease of native coronary artery without angina pectoris: Secondary | ICD-10-CM | POA: Diagnosis present

## 2015-05-30 DIAGNOSIS — E119 Type 2 diabetes mellitus without complications: Secondary | ICD-10-CM | POA: Diagnosis not present

## 2015-05-30 DIAGNOSIS — I270 Primary pulmonary hypertension: Secondary | ICD-10-CM | POA: Diagnosis not present

## 2015-05-30 DIAGNOSIS — Z6836 Body mass index (BMI) 36.0-36.9, adult: Secondary | ICD-10-CM | POA: Diagnosis not present

## 2015-05-30 DIAGNOSIS — R918 Other nonspecific abnormal finding of lung field: Secondary | ICD-10-CM | POA: Diagnosis not present

## 2015-05-30 DIAGNOSIS — R55 Syncope and collapse: Secondary | ICD-10-CM | POA: Diagnosis present

## 2015-05-30 LAB — BLOOD GAS, ARTERIAL
Acid-Base Excess: 6.1 mmol/L — ABNORMAL HIGH (ref 0.0–2.0)
BICARBONATE: 19.3 meq/L — AB (ref 20.0–24.0)
DRAWN BY: 22223
O2 CONTENT: 5 L/min
O2 SAT: 92.6 %
PO2 ART: 73.6 mmHg — AB (ref 80.0–100.0)
Patient temperature: 37
TCO2: 15 mmol/L (ref 0–100)
pCO2 arterial: 38.6 mmHg (ref 35.0–45.0)
pH, Arterial: 7.313 — ABNORMAL LOW (ref 7.350–7.450)

## 2015-05-30 LAB — CBC WITH DIFFERENTIAL/PLATELET
BASOS ABS: 0.1 10*3/uL (ref 0.0–0.1)
BASOS PCT: 0 %
Eosinophils Absolute: 0.3 10*3/uL (ref 0.0–0.7)
Eosinophils Relative: 1 %
HCT: 36.2 % (ref 36.0–46.0)
HEMOGLOBIN: 12.3 g/dL (ref 12.0–15.0)
LYMPHS PCT: 3 %
Lymphs Abs: 0.8 10*3/uL (ref 0.7–4.0)
MCH: 31.4 pg (ref 26.0–34.0)
MCHC: 34 g/dL (ref 30.0–36.0)
MCV: 92.3 fL (ref 78.0–100.0)
MONOS PCT: 3 %
Monocytes Absolute: 0.8 10*3/uL (ref 0.1–1.0)
NEUTROS ABS: 25.4 10*3/uL — AB (ref 1.7–7.7)
Neutrophils Relative %: 93 %
Platelets: 364 10*3/uL (ref 150–400)
RBC: 3.92 MIL/uL (ref 3.87–5.11)
RDW: 12.8 % (ref 11.5–15.5)
WBC: 27.3 10*3/uL — ABNORMAL HIGH (ref 4.0–10.5)

## 2015-05-30 LAB — PROCALCITONIN: Procalcitonin: 0.25 ng/mL

## 2015-05-30 LAB — COMPREHENSIVE METABOLIC PANEL
ALBUMIN: 3.8 g/dL (ref 3.5–5.0)
ALK PHOS: 86 U/L (ref 38–126)
ALT: 12 U/L — AB (ref 14–54)
AST: 23 U/L (ref 15–41)
Anion gap: 12 (ref 5–15)
BUN: 19 mg/dL (ref 6–20)
CO2: 22 mmol/L (ref 22–32)
Calcium: 9.6 mg/dL (ref 8.9–10.3)
Chloride: 105 mmol/L (ref 101–111)
Creatinine, Ser: 1.32 mg/dL — ABNORMAL HIGH (ref 0.44–1.00)
GFR calc Af Amer: 44 mL/min — ABNORMAL LOW (ref >60)
GFR calc non Af Amer: 38 mL/min — ABNORMAL LOW (ref >60)
GLUCOSE: 271 mg/dL — AB (ref 65–99)
Potassium: 4.6 mmol/L (ref 3.5–5.1)
SODIUM: 139 mmol/L (ref 135–145)
TOTAL PROTEIN: 7 g/dL (ref 6.5–8.1)
Total Bilirubin: 1.1 mg/dL (ref 0.3–1.2)

## 2015-05-30 LAB — I-STAT CG4 LACTIC ACID, ED: Lactic Acid, Venous: 2.46 mmol/L (ref 0.5–2.0)

## 2015-05-30 LAB — I-STAT TROPONIN, ED: Troponin i, poc: 0 ng/mL (ref 0.00–0.08)

## 2015-05-30 LAB — BRAIN NATRIURETIC PEPTIDE: B Natriuretic Peptide: 289 pg/mL — ABNORMAL HIGH (ref 0.0–100.0)

## 2015-05-30 MED ORDER — LEVALBUTEROL HCL 1.25 MG/0.5ML IN NEBU
1.2500 mg | INHALATION_SOLUTION | Freq: Four times a day (QID) | RESPIRATORY_TRACT | Status: DC | PRN
  Administered 2015-05-31: 1.25 mg via RESPIRATORY_TRACT
  Filled 2015-05-30: qty 0.5

## 2015-05-30 MED ORDER — SODIUM CHLORIDE 0.9 % IV BOLUS (SEPSIS): 1000.0000 mL | Freq: Once | INTRAVENOUS | Status: DC

## 2015-05-30 MED ORDER — IOHEXOL 350 MG/ML SOLN
100.0000 mL | Freq: Once | INTRAVENOUS | Status: AC | PRN
  Administered 2015-05-30: 75 mL via INTRAVENOUS

## 2015-05-30 MED ORDER — ESCITALOPRAM OXALATE 10 MG PO TABS
10.0000 mg | ORAL_TABLET | Freq: Every day | ORAL | Status: DC
  Administered 2015-05-31 – 2015-06-04 (×5): 10 mg via ORAL
  Filled 2015-05-30 (×5): qty 1

## 2015-05-30 MED ORDER — SODIUM CHLORIDE 0.9 % IV BOLUS (SEPSIS)
1000.0000 mL | Freq: Once | INTRAVENOUS | Status: AC
Start: 2015-05-30 — End: 2015-05-30
  Administered 2015-05-30: 1000 mL via INTRAVENOUS

## 2015-05-30 MED ORDER — FOLIC ACID 1 MG PO TABS
1.0000 mg | ORAL_TABLET | Freq: Every day | ORAL | Status: DC
  Administered 2015-05-31 – 2015-06-04 (×5): 1 mg via ORAL
  Filled 2015-05-30 (×5): qty 1

## 2015-05-30 MED ORDER — CEFTRIAXONE SODIUM 1 G IJ SOLR
1.0000 g | INTRAMUSCULAR | Status: DC
Start: 2015-05-30 — End: 2015-05-30
  Administered 2015-05-30: 1 g via INTRAVENOUS
  Filled 2015-05-30: qty 10

## 2015-05-30 MED ORDER — INSULIN ASPART 100 UNIT/ML ~~LOC~~ SOLN
0.0000 [IU] | SUBCUTANEOUS | Status: DC
  Administered 2015-05-31: 3 [IU] via SUBCUTANEOUS
  Administered 2015-05-31: 2 [IU] via SUBCUTANEOUS
  Administered 2015-06-01: 1 [IU] via SUBCUTANEOUS
  Administered 2015-06-01: 2 [IU] via SUBCUTANEOUS
  Administered 2015-06-01 – 2015-06-02 (×2): 1 [IU] via SUBCUTANEOUS
  Administered 2015-06-02: 7 [IU] via SUBCUTANEOUS
  Administered 2015-06-02 (×2): 5 [IU] via SUBCUTANEOUS
  Administered 2015-06-03: 3 [IU] via SUBCUTANEOUS
  Administered 2015-06-03: 9 [IU] via SUBCUTANEOUS
  Administered 2015-06-03: 3 [IU] via SUBCUTANEOUS
  Administered 2015-06-03: 7 [IU] via SUBCUTANEOUS
  Administered 2015-06-03: 3 [IU] via SUBCUTANEOUS

## 2015-05-30 MED ORDER — METOPROLOL TARTRATE 25 MG PO TABS
25.0000 mg | ORAL_TABLET | Freq: Two times a day (BID) | ORAL | Status: DC
  Administered 2015-05-31 – 2015-06-02 (×6): 25 mg via ORAL
  Filled 2015-05-30 (×6): qty 1

## 2015-05-30 MED ORDER — CLOPIDOGREL BISULFATE 75 MG PO TABS
75.0000 mg | ORAL_TABLET | Freq: Every day | ORAL | Status: DC
  Administered 2015-05-31 – 2015-06-04 (×5): 75 mg via ORAL
  Filled 2015-05-30 (×5): qty 1

## 2015-05-30 MED ORDER — SIMVASTATIN 20 MG PO TABS
20.0000 mg | ORAL_TABLET | Freq: Every day | ORAL | Status: DC
  Administered 2015-05-31 – 2015-06-04 (×5): 20 mg via ORAL
  Filled 2015-05-30 (×5): qty 1

## 2015-05-30 MED ORDER — GUAIFENESIN ER 600 MG PO TB12
600.0000 mg | ORAL_TABLET | Freq: Two times a day (BID) | ORAL | Status: DC
  Administered 2015-05-31 – 2015-06-01 (×4): 600 mg via ORAL
  Filled 2015-05-30 (×4): qty 1

## 2015-05-30 MED ORDER — ACETAMINOPHEN 325 MG PO TABS: 650.0000 mg | ORAL_TABLET | Freq: Four times a day (QID) | ORAL | Status: DC | PRN

## 2015-05-30 MED ORDER — TOPIRAMATE 25 MG PO TABS
25.0000 mg | ORAL_TABLET | Freq: Two times a day (BID) | ORAL | Status: DC
  Administered 2015-05-31 – 2015-06-04 (×9): 25 mg via ORAL
  Filled 2015-05-30 (×12): qty 1

## 2015-05-30 MED ORDER — INFLUENZA VAC SPLIT QUAD 0.5 ML IM SUSY
0.5000 mL | PREFILLED_SYRINGE | INTRAMUSCULAR | Status: AC
  Administered 2015-05-31: 0.5 mL via INTRAMUSCULAR
  Filled 2015-05-30: qty 0.5

## 2015-05-30 MED ORDER — AMLODIPINE BESYLATE 5 MG PO TABS
5.0000 mg | ORAL_TABLET | Freq: Every day | ORAL | Status: DC
  Administered 2015-05-31 – 2015-06-04 (×5): 5 mg via ORAL
  Filled 2015-05-30 (×5): qty 1

## 2015-05-30 MED ORDER — ISOSORBIDE MONONITRATE ER 60 MG PO TB24
30.0000 mg | ORAL_TABLET | Freq: Every day | ORAL | Status: DC
  Administered 2015-05-31 – 2015-06-04 (×5): 30 mg via ORAL
  Filled 2015-05-30 (×5): qty 1

## 2015-05-30 MED ORDER — AZITHROMYCIN 500 MG IV SOLR
500.0000 mg | INTRAVENOUS | Status: DC
  Administered 2015-05-31 – 2015-06-03 (×4): 500 mg via INTRAVENOUS
  Filled 2015-05-30 (×5): qty 500

## 2015-05-30 MED ORDER — SODIUM CHLORIDE 0.9 % IV SOLN
INTRAVENOUS | Status: AC
  Administered 2015-05-30: via INTRAVENOUS

## 2015-05-30 MED ORDER — DEXTROSE 5 % IV SOLN
500.0000 mg | INTRAVENOUS | Status: DC
  Administered 2015-05-30: 500 mg via INTRAVENOUS
  Filled 2015-05-30: qty 500

## 2015-05-30 MED ORDER — SODIUM CHLORIDE 0.9 % IJ SOLN
INTRAMUSCULAR | Status: AC
  Filled 2015-05-30: qty 1000

## 2015-05-30 MED ORDER — ENOXAPARIN SODIUM 40 MG/0.4ML ~~LOC~~ SOLN
40.0000 mg | SUBCUTANEOUS | Status: DC
  Administered 2015-05-31 – 2015-06-04 (×5): 40 mg via SUBCUTANEOUS
  Filled 2015-05-30 (×5): qty 0.4

## 2015-05-30 MED ORDER — DEXTROSE 5 % IV SOLN
1.0000 g | INTRAVENOUS | Status: DC
  Administered 2015-05-31 – 2015-06-03 (×4): 1 g via INTRAVENOUS
  Filled 2015-05-30 (×5): qty 10

## 2015-05-30 MED ORDER — IPRATROPIUM BROMIDE 0.02 % IN SOLN: 0.5000 mg | RESPIRATORY_TRACT | Status: DC | PRN

## 2015-05-30 MED ORDER — SODIUM CHLORIDE 0.9 % IJ SOLN
INTRAMUSCULAR | Status: AC
  Filled 2015-05-30: qty 60

## 2015-05-30 MED ORDER — ZOLPIDEM TARTRATE 5 MG PO TABS
5.0000 mg | ORAL_TABLET | Freq: Every evening | ORAL | Status: DC | PRN
  Administered 2015-05-31: 5 mg via ORAL
  Filled 2015-05-30: qty 1

## 2015-05-30 NOTE — ED Notes (Signed)
MD at bedside. 

## 2015-05-30 NOTE — ED Notes (Signed)
Pt sent by Dr. Isidore Moosffice. Pt had syncopal episode with incontinence while at the PCP office. Pt o2 saturation on room air 78%. Pt denies cp at this time but reports "discomfort". Pt alert and oriented. Shallow respirations and weakness noted in triage. Moderate dyspnea noted at rest.

## 2015-05-30 NOTE — ED Notes (Signed)
Patient 78% on RM.

## 2015-05-30 NOTE — ED Provider Notes (Signed)
CSN: [id number]     Arrival date & time 05/30/15  1703 History   First MD Initiated Contact with Patient 05/30/15 1707     Chief Complaint  Patient presents with  . Shortness of Breath     (Consider location/radiation/quality/duration/timing/severity/associated sxs/prior Treatment) Patient is a 80 y.o. female presenting with cough.  Cough Cough characteristics:  Non-productive Severity:  Mild Onset quality:  Gradual Duration:  4 days Timing:  Constant Progression:  Worsening Chronicity:  New Smoker: no   Relieved by:  None tried Worsened by:  Nothing tried Ineffective treatments:  None tried Associated symptoms: shortness of breath   Associated symptoms: no chest pain, no fever and no rash     Past Medical History  Diagnosis Date  . Coronary artery disease     stent to ramus intermedius in 2004  . Diabetes mellitus without complication (HCC)   . Hypertension   . Morbid obesity (HCC)   . NSVT (nonsustained ventricular tachycardia) (HCC)   . OSA (obstructive sleep apnea)     AHI during total sleep 10.27/hr and REM sleep 40.34/hr  . History of pulmonary hypertension   . Anemia   . Dyslipidemia   . History of nuclear stress test 2004    no significant ischemia   . Arterial insufficiency (HCC)     mild- 2007 LEAs   Past Surgical History  Procedure Laterality Date  . Coronary angioplasty  01/2003    3.0x15mm TAXUS DES to ramus intermedius  . Transthoracic echocardiogram  09/2011    EF=>55%, mild conc LVH, normal LV systolic function; LA mild-mod dilated; mild mitral annular calcif, mild MR; mild TR & normal RV systolic pressure; trace PVR   Family History  Problem Relation Age of Onset  . Family history unknown: Yes   Social History  Substance Use Topics  . Smoking status: Never Smoker   . Smokeless tobacco: Never Used  . Alcohol Use: No   OB History    No data available     Review of Systems  Constitutional: Negative for fever and activity change.  Eyes:  Negative for pain.  Respiratory: Positive for cough and shortness of breath.   Cardiovascular: Negative for chest pain.  Gastrointestinal: Negative for nausea, vomiting and constipation.  Genitourinary: Negative for dysuria.  Musculoskeletal: Negative for joint swelling.  Skin: Negative for rash.  Neurological: Positive for syncope.  All other systems reviewed and are negative.     Allergies  Ezetimibe and Lisinopril  Home Medications   Prior to Admission medications   Medication Sig Start Date End Date Taking? Authorizing Provider  amLODipine (NORVASC) 5 MG tablet Take 1 tablet (5 mg total) by mouth daily. 05/05/13  Yes Chrystie Nose, MD  clopidogrel (PLAVIX) 75 MG tablet Take 75 mg by mouth daily. 04/12/13  Yes Historical Provider, MD  Cyanocobalamin (VITAMIN B-12 IJ) Inject as directed every 30 (thirty) days.   Yes Historical Provider, MD  escitalopram (LEXAPRO) 10 MG tablet Take 10 mg by mouth daily.   Yes Historical Provider, MD  esomeprazole (NEXIUM) 40 MG capsule Take 40 mg by mouth daily before breakfast.   Yes Historical Provider, MD  folic acid (FOLVITE) 1 MG tablet Take 1 mg by mouth daily.   Yes Historical Provider, MD  glimepiride (AMARYL) 4 MG tablet Take 4 mg by mouth 2 (two) times daily.    Yes Historical Provider, MD  isosorbide mononitrate (IMDUR) 30 MG 24 hr tablet Take 30 mg by mouth daily.   Yes Historical  Provider, MD  metFORMIN (GLUCOPHAGE) 1000 MG tablet Take 1,000 mg by mouth 2 (two) times daily with a meal.    Yes Historical Provider, MD  metoprolol (LOPRESSOR) 50 MG tablet Take 25 mg by mouth 2 (two) times daily.   Yes Historical Provider, MD  simvastatin (ZOCOR) 20 MG tablet Take 20 mg by mouth daily.    Yes Historical Provider, MD  topiramate (TOPAMAX) 25 MG tablet Take 25 mg by mouth 2 (two) times daily.   Yes Historical Provider, MD  valsartan (DIOVAN) 160 MG tablet Take 160 mg by mouth daily.   Yes Historical Provider, MD  zolpidem (AMBIEN) 5 MG tablet  Take 5 mg by mouth at bedtime as needed for sleep.   Yes Historical Provider, MD   BP 186/67 mmHg  Pulse 119  Temp(Src) 98.7 F (37.1 C) (Oral)  Resp 32  Ht 5\' 8"  (1.727 m)  Wt 200 lb (90.719 kg)  BMI 30.42 kg/m2  SpO2 100% Physical Exam  Constitutional: She is oriented to person, place, and time. She appears well-developed and well-nourished. She appears distressed.  HENT:  Head: Normocephalic and atraumatic.  Eyes: Pupils are equal, round, and reactive to light.  Neck: Normal range of motion.  Cardiovascular: Tachycardia present.   Pulmonary/Chest: She is in respiratory distress. She has wheezes. She has rales.  Abdominal: Soft. She exhibits no distension. There is no tenderness.  Musculoskeletal: Normal range of motion. She exhibits no edema or tenderness.  Neurological: She is alert and oriented to person, place, and time.  Skin: Skin is warm and dry.  Psychiatric: She has a normal mood and affect.  Nursing note and vitals reviewed.   ED Course  Procedures (including critical care time)  CRITICAL CARE Performed by: Marily MemosMesner, Gertrude Tarbet   Total critical care time: 35 minutes  Critical care time was exclusive of separately billable procedures and treating other patients.  Critical care was necessary to treat or prevent imminent or life-threatening deterioration.  Critical care was time spent personally by me on the following activities: development of treatment plan with patient and/or surrogate as well as nursing, discussions with consultants, evaluation of patient's response to treatment, examination of patient, obtaining history from patient or surrogate, ordering and performing treatments and interventions, ordering and review of laboratory studies, ordering and review of radiographic studies, pulse oximetry and re-evaluation of patient's condition.   Labs Review Labs Reviewed  CBC WITH DIFFERENTIAL/PLATELET - Abnormal; Notable for the following:    WBC 27.3 (*)     Neutro Abs 25.4 (*)    All other components within normal limits  COMPREHENSIVE METABOLIC PANEL - Abnormal; Notable for the following:    Glucose, Bld 271 (*)    Creatinine, Ser 1.32 (*)    ALT 12 (*)    GFR calc non Af Amer 38 (*)    GFR calc Af Amer 44 (*)    All other components within normal limits  BRAIN NATRIURETIC PEPTIDE - Abnormal; Notable for the following:    B Natriuretic Peptide 289.0 (*)    All other components within normal limits  BLOOD GAS, ARTERIAL - Abnormal; Notable for the following:    pH, Arterial 7.313 (*)    pO2, Arterial 73.6 (*)    Bicarbonate 19.3 (*)    Acid-Base Excess 6.1 (*)    All other components within normal limits  I-STAT CG4 LACTIC ACID, ED - Abnormal; Notable for the following:    Lactic Acid, Venous 2.46 (*)    All other  components within normal limits  CULTURE, BLOOD (ROUTINE X 2)  CULTURE, BLOOD (ROUTINE X 2)  INFLUENZA PANEL BY PCR (TYPE A & B, H1N1)  PROCALCITONIN  URINALYSIS, ROUTINE W REFLEX MICROSCOPIC (NOT AT [facility])  Rosezena Sensor, ED    Imaging Review Dg Chest 2 View  05/30/2015  CLINICAL DATA:  Shortness of Breath EXAM: CHEST  2 VIEW COMPARISON:  10/29/2014 FINDINGS: Cardiomediastinal silhouette is stable. No acute infiltrate or pleural effusion. No pulmonary edema. Mild degenerative changes thoracic spine. IMPRESSION: No active cardiopulmonary disease. Electronically Signed   By: Natasha Mead M.D.   On: 05/30/2015 18:30   Ct Angio Chest Pe W/cm &/or Wo Cm  05/30/2015  CLINICAL DATA:  Hypoxia. EXAM: CT ANGIOGRAPHY CHEST WITH CONTRAST TECHNIQUE: Multidetector CT imaging of the chest was performed using the standard protocol during bolus administration of intravenous contrast. Multiplanar CT image reconstructions and MIPs were obtained to evaluate the vascular anatomy. CONTRAST:  75mL OMNIPAQUE IOHEXOL 350 MG/ML SOLN COMPARISON:  09/11/2007 FINDINGS: Cardiovascular: There is good opacification of the pulmonary arteries. There is no  pulmonary embolism. The thoracic aorta is normal in caliber and intact. Lungs: There is focal ground-glass opacity in the lateral inferior periphery of the lingula. This may represent early infectious infiltrate, but noninfectious inflammatory change or early atelectasis are also considerations. Lungs are otherwise clear. Central airways: Patent Effusions: None Lymphadenopathy: None Esophagus: Unremarkable Upper abdomen: No significant abnormality Musculoskeletal: Bridging calcifications at the anterior aspect of the vertebral column in the mid to lower thoracic spine, consistent with diffuse idiopathic skeletal hyperostosis. No significant skeletal lesions. Review of the MIP images confirms the above findings. IMPRESSION: 1. Negative for acute pulmonary embolism. 2. Patchy ground-glass opacity in the lateral lingula. This could represent early infectious infiltrate or atelectasis. Electronically Signed   By: Ellery Plunk M.D.   On: 05/30/2015 21:44   I have personally reviewed and evaluated these images and lab results as part of my medical decision-making.   EKG Interpretation   Date/Time:  Tuesday May 30 2015 17:17:48 EDT Ventricular Rate:  97 PR Interval:  181 QRS Duration: 88 QT Interval:  360 QTC Calculation: 457 R Axis:   58 Text Interpretation:  Sinus rhythm No significant change since last  tracing 10-29-2014 Confirmed by [facility] MD, Barbara Cower [phone]) on 05/30/2015  6:05:38 PM      MDM   Final diagnoses:  Hypoxia  Acute respiratory failure with hypoxia (HCC)  CAP (community acquired pneumonia)    80 yo F here from PCP with syncope, hypoxia, 3 days of cough and dyspnea. No leg swelling or fevers. Initially in respiratory distress, [place] applied with some relief. Crackles throughout lungs without focal asymmetry. No edema, JVD or appreciable S3. Initial ddx for chf exacerbation v CAP. Fluids, antibiotics, lactic checked.   cxr relatively clear without obvious cause. Still  tachypneic, hypoxic and tachycardic so will get CT PE to further delineate.   Not tolerating CT, increasing o2 requirement and tachypnea, started on BIPaP, ABG reassurring at this time.   Tolerating BiPaP, CT done and not showing any acute abnormalities. Patient mental status and work of breathing significantly improved on BiPaP. Will admit for further workup.     Marily Memos, MD 05/30/15 [phone]

## 2015-05-30 NOTE — H&P (Signed)
PCP:   Cassell SmilesFUSCO,[name], MD   Chief Complaint:  Sob, passed out at pcp office   HPI: 80 yo female h/o MO, osa, severe pHTN, CAD, DM comes in from PCP office.  She went to see her PCP today for several days of sob, coughing a lot and not feeling well.  Has been having chills but does not know about fever.  Pt is alone right now and is a little confused so unsure how reliable history is.  She has been having productive cough for four days.  Denies any swelling anywhere.  No chest pain.  In the PCP office her oxygen sats were mid 70s and she was tachypneic.  She passed out briefly and sent to ED.  In ED on arrival her oxygen was still low 80% on 3 liters.  She has no need for supplemental home oxygen chronically.  On arrival she was confused, hypoxic and placed on bipap.  She has been on bipap for several hours, and appears to be mentating better, but still not oriented to time (supposedly normal at baseline).  She says her breathing is better.  Also per RT her respiratory status has also improved with treatment in the ED.  Pt referred for admission for acute hypoxic respiratory failure due to likely pna.    Review of Systems:  Positive and negative as per HPI otherwise all other systems are negative but may be unreliable  Past Medical History: Past Medical History  Diagnosis Date  . Coronary artery disease     stent to ramus intermedius in 2004  . Diabetes mellitus without complication (HCC)   . Hypertension   . Morbid obesity (HCC)   . NSVT (nonsustained ventricular tachycardia) (HCC)   . OSA (obstructive sleep apnea)     AHI during total sleep 10.27/hr and REM sleep 40.34/hr  . History of pulmonary hypertension   . Anemia   . Dyslipidemia   . History of nuclear stress test 2004    no significant ischemia   . Arterial insufficiency (HCC)     mild- 2007 LEAs   Past Surgical History  Procedure Laterality Date  . Coronary angioplasty  01/2003    3.0x5620mm TAXUS DES to ramus intermedius   . Transthoracic echocardiogram  09/2011    EF=>55%, mild conc LVH, normal LV systolic function; LA mild-mod dilated; mild mitral annular calcif, mild MR; mild TR & normal RV systolic pressure; trace PVR    Medications: Prior to Admission medications   Medication Sig Start Date End Date Taking? Authorizing Provider  amLODipine (NORVASC) 5 MG tablet Take 1 tablet (5 mg total) by mouth daily. 05/05/13  Yes Chrystie NoseKenneth C Hilty, MD  clopidogrel (PLAVIX) 75 MG tablet Take 75 mg by mouth daily. 04/12/13  Yes Historical Provider, MD  Cyanocobalamin (VITAMIN B-12 IJ) Inject as directed every 30 (thirty) days.   Yes Historical Provider, MD  escitalopram (LEXAPRO) 10 MG tablet Take 10 mg by mouth daily.   Yes Historical Provider, MD  esomeprazole (NEXIUM) 40 MG capsule Take 40 mg by mouth daily before breakfast.   Yes Historical Provider, MD  folic acid (FOLVITE) 1 MG tablet Take 1 mg by mouth daily.   Yes Historical Provider, MD  glimepiride (AMARYL) 4 MG tablet Take 4 mg by mouth 2 (two) times daily.    Yes Historical Provider, MD  isosorbide mononitrate (IMDUR) 30 MG 24 hr tablet Take 30 mg by mouth daily.   Yes Historical Provider, MD  metFORMIN (GLUCOPHAGE) 1000 MG tablet  Take 1,000 mg by mouth 2 (two) times daily with a meal.    Yes Historical Provider, MD  metoprolol (LOPRESSOR) 50 MG tablet Take 25 mg by mouth 2 (two) times daily.   Yes Historical Provider, MD  simvastatin (ZOCOR) 20 MG tablet Take 20 mg by mouth daily.    Yes Historical Provider, MD  topiramate (TOPAMAX) 25 MG tablet Take 25 mg by mouth 2 (two) times daily.   Yes Historical Provider, MD  valsartan (DIOVAN) 160 MG tablet Take 160 mg by mouth daily.   Yes Historical Provider, MD  zolpidem (AMBIEN) 5 MG tablet Take 5 mg by mouth at bedtime as needed for sleep.   Yes Historical Provider, MD    Allergies:   Allergies  Allergen Reactions  . Ezetimibe   . Lisinopril Cough    Social History:  reports that she has never smoked. She  has never used smokeless tobacco. She reports that she does not drink alcohol or use illicit drugs.  Family History: Family History  Problem Relation Age of Onset  . Family history unknown: Yes    Physical Exam: Filed Vitals:   05/30/15 2100 05/30/15 2200 05/30/15 2300 05/30/15 2307  BP: 160/72 186/67 190/86   Pulse: 100 119 117   Temp:  98.7 F (37.1 C)  100 F (37.8 C)  TempSrc:  Oral  Axillary  Resp: 27 32    Height:     (1.702 m)  Weight:    98.1 kg (216 lb 4.3 oz)  SpO2: 100% 100% 99%    General appearance: alert, cooperative and mild distress Head: Normocephalic, without obvious abnormality, atraumatic Eyes: negative Nose: Nares normal. Septum midline. Mucosa normal. No drainage or sinus tenderness. Neck: no JVD and supple, symmetrical, trachea midline Lungs: rhonchi bilaterally and wheezes bilaterally  Coarse throughout, mild wheezing at bases Heart: regular rate and rhythm tachycardic no m/r/g Abdomen: soft, non-tender; bowel sounds normal; no masses,  no organomegaly Extremities: extremities normal, atraumatic, no cyanosis or edema Pulses: 2+ and symmetric Skin: Skin color, texture, turgor normal. No rashes or lesions Neurologic: Grossly normal  Oriented to person place, not time   Labs on Admission:   Recent Labs  05/30/15 1731  NA 139  K 4.6  CL 105  CO2 22  GLUCOSE 271*  BUN 19  CREATININE 1.32*  CALCIUM 9.6    Recent Labs  05/30/15 1731  AST 23  ALT 12*  ALKPHOS 86  BILITOT 1.1  PROT 7.0  ALBUMIN 3.8    Recent Labs  05/30/15 1731  WBC 27.3*  NEUTROABS 25.4*  HGB 12.3  HCT 36.2  MCV 92.3  PLT 364   Radiological Exams on Admission: Dg Chest 2 View  05/30/2015  CLINICAL DATA:  Shortness of Breath EXAM: CHEST  2 VIEW COMPARISON:  10/29/2014 FINDINGS: Cardiomediastinal silhouette is stable. No acute infiltrate or pleural effusion. No pulmonary edema. Mild degenerative changes thoracic spine. IMPRESSION: No active cardiopulmonary  disease. Electronically Signed   By: Natasha Mead M.D.   On: 05/30/2015 18:30   Ct Angio Chest Pe W/cm &/or Wo Cm  05/30/2015  CLINICAL DATA:  Hypoxia. EXAM: CT ANGIOGRAPHY CHEST WITH CONTRAST TECHNIQUE: Multidetector CT imaging of the chest was performed using the standard protocol during bolus administration of intravenous contrast. Multiplanar CT image reconstructions and MIPs were obtained to evaluate the vascular anatomy. CONTRAST:  75mL OMNIPAQUE IOHEXOL 350 MG/ML SOLN COMPARISON:  09/11/2007 FINDINGS: Cardiovascular: There is good opacification of the pulmonary arteries. There is no  pulmonary embolism. The thoracic aorta is normal in caliber and intact. Lungs: There is focal ground-glass opacity in the lateral inferior periphery of the lingula. This may represent early infectious infiltrate, but noninfectious inflammatory change or early atelectasis are also considerations. Lungs are otherwise clear. Central airways: Patent Effusions: None Lymphadenopathy: None Esophagus: Unremarkable Upper abdomen: No significant abnormality Musculoskeletal: Bridging calcifications at the anterior aspect of the vertebral column in the mid to lower thoracic spine, consistent with diffuse idiopathic skeletal hyperostosis. No significant skeletal lesions. Review of the MIP images confirms the above findings. IMPRESSION: 1. Negative for acute pulmonary embolism. 2. Patchy ground-glass opacity in the lateral lingula. This could represent early infectious infiltrate or atelectasis. Electronically Signed   By: Ellery Plunk M.D.   On: 05/30/2015 21:44   Old chart reviewed Case discussed with dr Erin Hearing in ED ekg reviewed nsr no acute issues cxr reviewed no edema or infiltrate  Assessment/Plan  80 yo female with acute hypoxic respiratory failure, cough, sob, fever, leukocytosis , early sepsis consistent with CAP  Principal Problem:   Acute respiratory failure with hypoxia (HCC)- the etiology appears to be  infectious, pna although her cxr and ct are not showing infiltrate.  Will likely appear in the next couple of days with fluid hydration.  Will obtain blood and sputum cultures.  Place on pna pathway.  Lactic acid mildly elevated, has been given a liter of ivf already will place on maintenance fluids unless her lactic acid level worsens or bp drops will gentle bolus again.  Will check procalcitonin levels.  Quick flu is pending.  Will continue her on bipap through most of the night and wean as she tolerates, will repeat an abg tonight around 2am.  Place on iv rocephin and azithromycin iv .  Place foley catheter due to critical nature of illness.  Active Problems:   Pulmonary hypertension, moderate to severe (HCC)- noted, pt has no h/o chf in chart, and not on chronic diuretics.  Be cautious with ivf for this reason.  Will also check cardiac echo in am.  May very well have diastolic dysfunction.   CAP (community acquired pneumonia)-  As above   Metabolic encephalopathy- due to infection, component of sepsis and hypoxia.  No focal neuro deficits, this appears to be improving with treating her hypoxia   Morbid obesity (HCC)- noted   Obstructive sleep apnea- noted   CAD (coronary artery disease)- ekg nonischemic, will serial trop overnight   Diabetes mellitus without complication (HCC)-  Hold oral diabetic meds, place on q 4 hour SSI as she will be npo while on bipap   Syncope- due to her hypoxia.  Treat pna.   HTN uncontrolled - cont her home bp meds, on bblocker, imdur, norvasc but will hold ARB  Admit to stepdown.  Presumptive full code.    Charna Neeb A 05/30/2015, 11:15 PM

## 2015-05-31 ENCOUNTER — Inpatient Hospital Stay (HOSPITAL_COMMUNITY): Payer: Medicare Other

## 2015-05-31 DIAGNOSIS — R0600 Dyspnea, unspecified: Secondary | ICD-10-CM

## 2015-05-31 DIAGNOSIS — G4733 Obstructive sleep apnea (adult) (pediatric): Secondary | ICD-10-CM

## 2015-05-31 DIAGNOSIS — J189 Pneumonia, unspecified organism: Secondary | ICD-10-CM

## 2015-05-31 DIAGNOSIS — I272 Other secondary pulmonary hypertension: Secondary | ICD-10-CM

## 2015-05-31 DIAGNOSIS — A419 Sepsis, unspecified organism: Secondary | ICD-10-CM | POA: Diagnosis not present

## 2015-05-31 DIAGNOSIS — G9341 Metabolic encephalopathy: Secondary | ICD-10-CM

## 2015-05-31 DIAGNOSIS — J9601 Acute respiratory failure with hypoxia: Secondary | ICD-10-CM

## 2015-05-31 LAB — GLUCOSE, CAPILLARY
GLUCOSE-CAPILLARY: 92 mg/dL (ref 65–99)
GLUCOSE-CAPILLARY: 139 mg/dL — AB (ref 65–99)
Glucose-Capillary: 220 mg/dL — ABNORMAL HIGH (ref 65–99)
Glucose-Capillary: 69 mg/dL (ref 65–99)
Glucose-Capillary: 79 mg/dL (ref 65–99)
Glucose-Capillary: 92 mg/dL (ref 65–99)

## 2015-05-31 LAB — BLOOD GAS, ARTERIAL
Acid-base deficit: 3.8 mmol/L — ABNORMAL HIGH (ref 0.0–2.0)
BICARBONATE: 21.2 meq/L (ref 20.0–24.0)
DELIVERY SYSTEMS: POSITIVE
Drawn by: 317771
Expiratory PAP: 6
FIO2: 0.75
Inspiratory PAP: 12
O2 SAT: 99.7 %
PO2 ART: 292 mmHg — AB (ref 80.0–100.0)
RATE: 8 resp/min
pCO2 arterial: 41.2 mmHg (ref 35.0–45.0)
pH, Arterial: 7.331 — ABNORMAL LOW (ref 7.350–7.450)

## 2015-05-31 LAB — URINALYSIS, ROUTINE W REFLEX MICROSCOPIC
Bilirubin Urine: NEGATIVE
Glucose, UA: 250 mg/dL — AB
Hgb urine dipstick: NEGATIVE
KETONES UR: NEGATIVE mg/dL
LEUKOCYTES UA: NEGATIVE
NITRITE: NEGATIVE
PROTEIN: NEGATIVE mg/dL
Specific Gravity, Urine: 1.015 (ref 1.005–1.030)
Urobilinogen, UA: 0.2 mg/dL (ref 0.0–1.0)
pH: 6 (ref 5.0–8.0)

## 2015-05-31 LAB — TROPONIN I
TROPONIN I: 0.03 ng/mL (ref <0.031)
Troponin I: 0.03 ng/mL (ref <0.031)

## 2015-05-31 LAB — CBC WITH DIFFERENTIAL/PLATELET
BASOS ABS: 0.1 10*3/uL (ref 0.0–0.1)
BASOS PCT: 1 %
Eosinophils Absolute: 0 10*3/uL (ref 0.0–0.7)
Eosinophils Relative: 0 %
HCT: 32.2 % — ABNORMAL LOW (ref 36.0–46.0)
HEMOGLOBIN: 10.9 g/dL — AB (ref 12.0–15.0)
Lymphocytes Relative: 3 %
Lymphs Abs: 0.4 10*3/uL — ABNORMAL LOW (ref 0.7–4.0)
MCH: 31.2 pg (ref 26.0–34.0)
MCHC: 33.9 g/dL (ref 30.0–36.0)
MCV: 92.3 fL (ref 78.0–100.0)
Monocytes Absolute: 0.9 10*3/uL (ref 0.1–1.0)
Monocytes Relative: 5 %
NEUTROS ABS: 15.8 10*3/uL — AB (ref 1.7–7.7)
NEUTROS PCT: 91 %
Platelets: 271 10*3/uL (ref 150–400)
RBC: 3.49 MIL/uL — AB (ref 3.87–5.11)
RDW: 12.9 % (ref 11.5–15.5)
WBC: 17.3 10*3/uL — AB (ref 4.0–10.5)

## 2015-05-31 LAB — BASIC METABOLIC PANEL
ANION GAP: 6 (ref 5–15)
BUN: 19 mg/dL (ref 6–20)
CALCIUM: 8.6 mg/dL — AB (ref 8.9–10.3)
CO2: 23 mmol/L (ref 22–32)
Chloride: 111 mmol/L (ref 101–111)
Creatinine, Ser: 1.14 mg/dL — ABNORMAL HIGH (ref 0.44–1.00)
GFR, EST AFRICAN AMERICAN: 52 mL/min — AB (ref >60)
GFR, EST NON AFRICAN AMERICAN: 45 mL/min — AB (ref >60)
Glucose, Bld: 107 mg/dL — ABNORMAL HIGH (ref 65–99)
POTASSIUM: 4 mmol/L (ref 3.5–5.1)
Sodium: 140 mmol/L (ref 135–145)

## 2015-05-31 LAB — LACTIC ACID, PLASMA
LACTIC ACID, VENOUS: 1.6 mmol/L (ref 0.5–2.0)
LACTIC ACID, VENOUS: 1.2 mmol/L (ref 0.5–2.0)

## 2015-05-31 LAB — INFLUENZA PANEL BY PCR (TYPE A & B)
H1N1FLUPCR: NOT DETECTED
Influenza A By PCR: NEGATIVE
Influenza B By PCR: NEGATIVE

## 2015-05-31 LAB — STREP PNEUMONIAE URINARY ANTIGEN: Strep Pneumo Urinary Antigen: NEGATIVE

## 2015-05-31 LAB — MRSA PCR SCREENING: MRSA BY PCR: NEGATIVE

## 2015-05-31 MED ORDER — TOPIRAMATE 25 MG PO TABS
ORAL_TABLET | ORAL | Status: AC
  Filled 2015-05-31: qty 1

## 2015-05-31 MED ORDER — GUAIFENESIN ER 600 MG PO TB12: 600.0000 mg | ORAL_TABLET | Freq: Two times a day (BID) | ORAL | Status: DC

## 2015-05-31 MED ORDER — ENSURE ENLIVE PO LIQD
237.0000 mL | Freq: Two times a day (BID) | ORAL | Status: DC
  Administered 2015-05-31 – 2015-06-03 (×7): 237 mL via ORAL

## 2015-05-31 NOTE — Progress Notes (Signed)
**Note De-identified  Obfuscation** Patient removed from BIPAP and placed on 4 L [place]; tolerating well.  RRT to continue to monitor 

## 2015-05-31 NOTE — Progress Notes (Signed)
Initial Nutrition Assessment  DOCUMENTATION CODES:  Obesity unspecified  INTERVENTION:  Ensure Enlive po BID, each supplement provides 350 kcal and 20 grams of protein  NUTRITION DIAGNOSIS:  Inadequate oral intake related to poor appetite as evidenced by loss of 8.5% bw in 7 months.  GOAL:  Patient will meet greater than or equal to 90% of their needs  MONITOR:  PO intake, Supplement acceptance, Labs, Diet advancement  REASON FOR ASSESSMENT:  Malnutrition Screening Tool    ASSESSMENT:  80 yo female h/o  osa, severe pHTN, CAD, DM comes in from PCP office after having several days SOB, coughing, and general malaise.  Admitted for acute respiratory failure due to likely PNA.   Spoke with patient and her husband. He appeared to be more transparent about patients poor appetite than she was. He reports that pt's appetite has diminished. No n/v/c/d associated with loss of appetite. She does not eat meals, just grazes, on junk food. When it gets time to eat meals, she will not be hungry. When she does eat a meal, she will never finish it; she eats about half. Pt does not follow any diet. She reportedly eats very little protein.  She does take a mvi.   Husband reports wt loss of 20-30 lbs over the last couple years. Per documentation, she has lost ~ 20 lbs in 7 months which is just shy of being clinically significant.   Pt asked when she would be able to eat. Told pt that MD likely waiting for hypoxia to be controlled before allowing off mask. Per nurse tech, pts sats dropped after removal of mask this morning. Patient was agreeable to EE BID once diet advanced.    NFPE: WDL  Diet Order:  Diet NPO time specified Except for: Sips with Meds  Skin:  Reviewed, no issues  Last BM:  Unknown  Height:  Ht Readings from Last 1 Encounters:  05/30/15 5\' 7"  (1.702 m)   Weight:  Wt Readings from Last 1 Encounters:  05/31/15 216 lb 4.3 oz (98.1 kg)   Wt Readings from Last 10 Encounters:   05/31/15 216 lb 4.3 oz (98.1 kg)  10/29/14 219 lb (99.338 kg)  05/05/13 219 lb 14.4 oz (99.746 kg)  09/11/10 234 lb 5 oz (106.283 kg)  05/03/10 234 lb (106.142 kg)  Admit weight: 200 lbs. (90.9 kg)  Ideal Body Weight:  61.4 kg  BMI:  Body mass index is 33.86 kg/(m^2).  Estimated Nutritional Needs:  Kcal:  1550-1750 (17-19 kcal/kg) Protein:  74-86 (1.2-1.4 g/kg IBW) Fluid:  1.6-1.8 liters fluid  EDUCATION NEEDS:  No education needs identified at this time  [name] Mueller RD, LDN Nutrition Pager: [phone] 05/31/2015 1:43 PM

## 2015-05-31 NOTE — Progress Notes (Signed)
Decreased Pt's FIO2 to 40% on BIPAP machine, P02 on ABG was 292.

## 2015-05-31 NOTE — Progress Notes (Signed)
[facility] PROGRESS NOTE  CRYSTALLYNN NOORANI ZOX:[id number] DOB: [birth year] DOA: 05/30/2015 PCP: Cassell Smiles., MD  Assessment/Plan: 1. Acute respiratory failure with hypoxia, improved with BIPAP. Now stable on [place] although is still requiring BIPAP QHS. Will wean off O2 as tolerated. Will consult pulmonology.  2. Pulmonary hypertension, moderate to severe. Hx of CHF per chart however she is not on chronic diuretics. Echo pending. Will give IVFs cautiously. She has sleep apnea and reports noncompliance with CPAP. She was counseled on the importance of CPAP compliance. 3.  CAP. WBC trending down at 17.3, afebrile. Blood and sputum cultures pending. Lactic acid WNL following IVF. Influenza negative. CXR and CT chest unremarkable.   4. Metabolic encephalopathy, likely due to infection/hypoxia. Appears resolved with abx, IVF, and supplemental O2. No focal neurological deficits.  5. Morbid obesity 6. OSA, noncompliant with CPAP at night.  7. CAD, EKG unremarkable. Serial troponins negative. Patient denies any chest pain.  8. DM type 2, stable. Continue SSI. 9. Syncope, likely due to hypoxia.  10. Essential HTN uncontrolled. Continue home BB, Imudur, and Norvasc. Hold ARB.    Code Status: Full DVT prophylaxis: Lovenox  Family Communication: Husband at bedside. Discussed with patient who understands and has no concerns at this time. Disposition Plan: Anticipate discharge within 1-2 days.    Consultants:  none  Procedures:  none  Antibiotics:  Rocephin 11/1>>  Azithromycin 11/2>>  HPI/Subjective: Feels better. SOB and cough are improved but still present. Denies any CP, nausea, or vomiting. Feels hungry.   Objective: Filed Vitals:   05/31/15 0630  BP: 142/61  Pulse: 100  Temp:   Resp: 24    Intake/Output Summary (Last 24 hours) at 05/31/15 0731 Last data filed at 05/31/15 0600  Gross per 24 hour  Intake 613.33 ml  Output    500 ml  Net 113.33 ml   Filed Weights    05/30/15 1709 05/30/15 2307 05/31/15 0500  Weight: 90.719 kg (200 lb) 98.1 kg (216 lb 4.3 oz) 98.1 kg (216 lb 4.3 oz)    Exam:  General: NAD, mildly SOB with conversation.  Cardiovascular: RRR, S1, S2  Respiratory:  Bilateral rhonchi, more on the left.  No wheezing or  rales.  Abdomen: soft, non tender, no distention , bowel sounds normal Musculoskeletal: No edema b/l  Data Reviewed: Basic Metabolic Panel:  Recent Labs Lab 05/30/15 1731 05/31/15 0349  NA 139 140  K 4.6 4.0  CL 105 111  CO2 22 23  GLUCOSE 271* 107*  BUN 19 19  CREATININE 1.32* 1.14*  CALCIUM 9.6 8.6*   Liver Function Tests:  Recent Labs Lab 05/30/15 1731  AST 23  ALT 12*  ALKPHOS 86  BILITOT 1.1  PROT 7.0  ALBUMIN 3.8   CBC:  Recent Labs Lab 05/30/15 1731 05/31/15 0349  WBC 27.3* 17.3*  NEUTROABS 25.4* 15.8*  HGB 12.3 10.9*  HCT 36.2 32.2*  MCV 92.3 92.3  PLT 364 271   Cardiac Enzymes:  Recent Labs Lab 05/30/15 2322 05/31/15 0349  TROPONINI <0.03 0.03   BNP (last 3 results)  Recent Labs  05/30/15 1731  BNP 289.0*    CBG:  Recent Labs Lab 05/31/15 0017 05/31/15 0500  GLUCAP 220* 92    Recent Results (from the past 240 hour(s))  MRSA PCR Screening     Status: None   Collection Time: 05/30/15 11:40 PM  Result Value Ref Range Status   MRSA by PCR NEGATIVE NEGATIVE Final    Comment:  The GeneXpert MRSA Assay (FDA approved for NASAL specimens only), is one component of a comprehensive MRSA colonization surveillance program. It is not intended to diagnose MRSA infection nor to guide or monitor treatment for MRSA infections.      Studies: Dg Chest 2 View  05/30/2015  CLINICAL DATA:  Shortness of Breath EXAM: CHEST  2 VIEW COMPARISON:  10/29/2014 FINDINGS: Cardiomediastinal silhouette is stable. No acute infiltrate or pleural effusion. No pulmonary edema. Mild degenerative changes thoracic spine. IMPRESSION: No active cardiopulmonary disease.  Electronically Signed   By: Natasha MeadLiviu  Pop M.D.   On: 05/30/2015 18:30   Ct Angio Chest Pe W/cm &/or Wo Cm  05/30/2015  CLINICAL DATA:  Hypoxia. EXAM: CT ANGIOGRAPHY CHEST WITH CONTRAST TECHNIQUE: Multidetector CT imaging of the chest was performed using the standard protocol during bolus administration of intravenous contrast. Multiplanar CT image reconstructions and MIPs were obtained to evaluate the vascular anatomy. CONTRAST:  75mL OMNIPAQUE IOHEXOL 350 MG/ML SOLN COMPARISON:  09/11/2007 FINDINGS: Cardiovascular: There is good opacification of the pulmonary arteries. There is no pulmonary embolism. The thoracic aorta is normal in caliber and intact. Lungs: There is focal ground-glass opacity in the lateral inferior periphery of the lingula. This may represent early infectious infiltrate, but noninfectious inflammatory change or early atelectasis are also considerations. Lungs are otherwise clear. Central airways: Patent Effusions: None Lymphadenopathy: None Esophagus: Unremarkable Upper abdomen: No significant abnormality Musculoskeletal: Bridging calcifications at the anterior aspect of the vertebral column in the mid to lower thoracic spine, consistent with diffuse idiopathic skeletal hyperostosis. No significant skeletal lesions. Review of the MIP images confirms the above findings. IMPRESSION: 1. Negative for acute pulmonary embolism. 2. Patchy ground-glass opacity in the lateral lingula. This could represent early infectious infiltrate or atelectasis. Electronically Signed   By: Ellery Plunkaniel R Mitchell M.D.   On: 05/30/2015 21:44    Scheduled Meds: . amLODipine  5 mg Oral Daily  . azithromycin  500 mg Intravenous Q24H  . cefTRIAXone (ROCEPHIN)  IV  1 g Intravenous Q24H  . clopidogrel  75 mg Oral Daily  . enoxaparin (LOVENOX) injection  40 mg Subcutaneous Q24H  . escitalopram  10 mg Oral Daily  . folic acid  1 mg Oral Daily  . guaiFENesin  600 mg Oral BID  . Influenza vac split quadrivalent PF  0.5 mL  Intramuscular Tomorrow-1000  . insulin aspart  0-9 Units Subcutaneous 6 times per day  . isosorbide mononitrate  30 mg Oral Daily  . metoprolol  25 mg Oral BID  . simvastatin  20 mg Oral Daily  . topiramate  25 mg Oral BID   Continuous Infusions: . sodium chloride 100 mL/hr at 05/31/15 0600    Principal Problem:   Acute respiratory failure with hypoxia (HCC) Active Problems:   Morbid obesity (HCC)   Obstructive sleep apnea   CAD (coronary artery disease)   Pulmonary hypertension, moderate to severe (HCC)   CAP (community acquired pneumonia)   Diabetes mellitus without complication (HCC)   Syncope   Metabolic encephalopathy    Time spent: 15 minutes   Jehanzeb Memon. MD [facility] Pager [phone]. If 7PM-7AM, please contact night-coverage at [email or web address], password [email or web address] 05/31/2015, 7:31 AM  LOS: 1 day     By signing my name below, I, Burnett HarryJennifer Gregorio, attest that this documentation has been prepared under the direction and in the presence of [facility] CampusJehanzeb Memon. MD Electronically Signed: Burnett HarryJennifer Gregorio, Scribe. 05/31/2015 9:18am  I, Dr. Erick BlinksJehanzeb Memon, personally performed the services described in this documentaiton.  All medical record entries made by the scribe were at my direction and in my presence. I have reviewed the chart and agree that the record reflects my personal performance and is accurate and complete  Erick Blinks, MD, 05/31/2015 9:31 AM

## 2015-05-31 NOTE — Progress Notes (Signed)
RN called to pt's room. Pt having difficulty breathing. O2 sats 89-90 on 4 L . RR 26-35. Placed on NRB. sats recovered to 100% but pt still experiencing increased work of breathing. RR in 30s. Placed pt. Back on bi-pap. Respiratory notified. Will continue to monitor.

## 2015-06-01 DIAGNOSIS — R55 Syncope and collapse: Secondary | ICD-10-CM

## 2015-06-01 DIAGNOSIS — E119 Type 2 diabetes mellitus without complications: Secondary | ICD-10-CM

## 2015-06-01 LAB — GLUCOSE, CAPILLARY
GLUCOSE-CAPILLARY: 94 mg/dL (ref 65–99)
GLUCOSE-CAPILLARY: 66 mg/dL (ref 65–99)
GLUCOSE-CAPILLARY: 124 mg/dL — AB (ref 65–99)
GLUCOSE-CAPILLARY: 166 mg/dL — AB (ref 65–99)
Glucose-Capillary: 142 mg/dL — ABNORMAL HIGH (ref 65–99)
Glucose-Capillary: 89 mg/dL (ref 65–99)
Glucose-Capillary: 95 mg/dL (ref 65–99)

## 2015-06-01 LAB — BASIC METABOLIC PANEL
ANION GAP: 6 (ref 5–15)
BUN: 15 mg/dL (ref 6–20)
CO2: 22 mmol/L (ref 22–32)
Calcium: 8.8 mg/dL — ABNORMAL LOW (ref 8.9–10.3)
Chloride: 110 mmol/L (ref 101–111)
Creatinine, Ser: 1 mg/dL (ref 0.44–1.00)
GFR, EST NON AFRICAN AMERICAN: 53 mL/min — AB (ref >60)
Glucose, Bld: 89 mg/dL (ref 65–99)
POTASSIUM: 3.7 mmol/L (ref 3.5–5.1)
SODIUM: 138 mmol/L (ref 135–145)

## 2015-06-01 LAB — LEGIONELLA PNEUMOPHILA SEROGP 1 UR AG: L. PNEUMOPHILA SEROGP 1 UR AG: NEGATIVE

## 2015-06-01 LAB — CBC
HEMATOCRIT: 32.2 % — AB (ref 36.0–46.0)
HEMOGLOBIN: 10.7 g/dL — AB (ref 12.0–15.0)
MCH: 30.7 pg (ref 26.0–34.0)
MCHC: 33.2 g/dL (ref 30.0–36.0)
MCV: 92.5 fL (ref 78.0–100.0)
Platelets: 278 10*3/uL (ref 150–400)
RBC: 3.48 MIL/uL — ABNORMAL LOW (ref 3.87–5.11)
RDW: 13 % (ref 11.5–15.5)
WBC: 9.9 10*3/uL (ref 4.0–10.5)

## 2015-06-01 LAB — PROCALCITONIN: PROCALCITONIN: 0.6 ng/mL

## 2015-06-01 MED ORDER — IPRATROPIUM-ALBUTEROL 0.5-2.5 (3) MG/3ML IN SOLN: 3.0000 mL | Freq: Four times a day (QID) | RESPIRATORY_TRACT | Status: DC

## 2015-06-01 MED ORDER — IPRATROPIUM-ALBUTEROL 0.5-2.5 (3) MG/3ML IN SOLN
3.0000 mL | RESPIRATORY_TRACT | Status: DC
  Administered 2015-06-01 – 2015-06-03 (×13): 3 mL via RESPIRATORY_TRACT
  Filled 2015-06-01 (×14): qty 3

## 2015-06-01 MED ORDER — HYDRALAZINE HCL 20 MG/ML IJ SOLN: 10.0000 mg | Freq: Four times a day (QID) | INTRAMUSCULAR | Status: DC | PRN

## 2015-06-01 MED ORDER — GUAIFENESIN ER 600 MG PO TB12
1200.0000 mg | ORAL_TABLET | Freq: Two times a day (BID) | ORAL | Status: DC
  Administered 2015-06-01 – 2015-06-04 (×6): 1200 mg via ORAL
  Filled 2015-06-01 (×6): qty 2

## 2015-06-01 NOTE — Progress Notes (Signed)
**Note De-Identified  Obfuscation** Patient removed from BIPAP and placed on 8L [place]; SAT 99%; tolerating well.  RRT to continue to monitor.

## 2015-06-01 NOTE — Clinical Documentation Improvement (Signed)
Internal Medicine  Can the diagnosis of CHF be further specified? Please update your documentation within the medical record to reflect your response to this query. Thank you!    Acuity - Acute, Chronic, Acute on Chronic   Type - Systolic, Diastolic, Systolic and Diastolic  Other  Clinically Undetermined  Document any associated diagnoses/conditions  ECHO results: EF 55-60% with diastolic function abnormal  History of pulmonary hypertension and obesity  Supporting Information:  Being treated with Lopressor 25 mg PO 2 x's daily  Please exercise your independent, professional judgment when responding. A specific answer is not anticipated or expected.  Thank You,  Shellee MiloEileen T Railyn House RN, BSN Health Information Management  [phone]; Cell: [phone]

## 2015-06-01 NOTE — Clinical Documentation Improvement (Signed)
Internal Medicine  Conflicting documentation noted in chart. Please clarify if Sepsis ruled in or out and update your documentation within the medical record to reflect your response to this query. Thank you!   "Metabolic encephalopathy- due to infection, component of sepsis and hypoxia" documented in H&P  Pneumonia being documented in ED and other Progress Notes  Sepsis ruled in  Sepsis ruled out  Other  Clinically Undetermined  Supporting Information:  V/S's: HR > 90, RR > 26, WBC = 27.3, Lactate 2.46, 1.6 and 1.2  IV Rocephin and Zithromax initiated  Please exercise your independent, professional judgment when responding. A specific answer is not anticipated or expected.  Thank You, Shellee MiloEileen T Iolanda Folson RN, BSN Health Information Management [place] [phone]; Cell: [phone]

## 2015-06-01 NOTE — Progress Notes (Signed)
[facility] PROGRESS NOTE  [name] Mueller UEA:[id number] DOB: 1934-12-08 DOA: 05/30/2015 PCP: Cassell Smiles., MD  Assessment/Plan: 1. Acute respiratory failure with hypoxia, improved with BIPAP. Pt is currently on high flow [place], she is still requiring BIPAP QHS. Will continue to try to wean off O2 as tolerated. Pulmonary input appreciated. Since patient is wheezing will continue with bronchodilators and consider steroids.  2.  CAP. Mild fever overnight, currently afebrile. WBC now WNL. Blood and sputum cultures pending, no growth to date. Lactic acid WNL following IVF. Influenza negative. CXR and CT chest unremarkable.  She is currently on rocephin and azithromycin. Will likely transition to PO in the next 24 hours.  3. Metabolic encephalopathy, likely due to infection/hypoxia. Appears resolved with abx, IVF, and supplemental O2. No focal neurological deficits.  4. Morbid obesity. 5. OSA, noncompliant with CPAP at night.  6. CAD, EKG unremarkable. Serial troponins negative. Patient denies any chest pain.  7. DM type 2, stable. Will continue SSI. 8. Syncope, likely due to hypoxia.  9. Essential HTN uncontrolled. Continue home BB, Imudur, and Norvasc. Hold ARB.    Code Status: Full DVT prophylaxis: Lovenox  Family Communication: Husband at bedside. Discussed with patient who understands and has no concerns at this time. Disposition Plan: Anticipate discharge within 1-2 days.    Consultants:  Pulmology   Procedures:  ECHO Study Conclusions  - Left ventricle: The cavity size was normal. Wall thickness was increased in a pattern of moderate LVH. Systolic function was normal. The estimated ejection fraction was in the range of 55% to 60%. Diastolic function is abnormal, indeterminate grade. Wall motion was normal; there were no regional wall motion abnormalities. - Aortic valve: Mildly calcified annulus. Trileaflet; mildly thickened leaflets. Valve area (VTI): 1.54  cm^2. Valve area (Vmax): 1.54 cm^2. - Mitral valve: Mildly calcified annulus. Mildly thickened leaflets . There was mild regurgitation. - Left atrium: The atrium was moderately dilated. - Technically adequate study.  Antibiotics:  Rocephin 11/1>>  Azithromycin 11/2>>  HPI/Subjective: Compared to yesterday, her breathing is ok. She still has a mild productive cough . Is not on chronic oxygen at home. Able to eat without difficulty.    Objective: Filed Vitals:   06/01/15 0500  BP: 171/62  Pulse: 95  Temp:   Resp: 15    Intake/Output Summary (Last 24 hours) at 06/01/15 0638 Last data filed at 05/31/15 2300  Gross per 24 hour  Intake    500 ml  Output    350 ml  Net    150 ml   Filed Weights   05/30/15 1709 05/30/15 2307 05/31/15 0500  Weight: 90.719 kg (200 lb) 98.1 kg (216 lb 4.3 oz) 98.1 kg (216 lb 4.3 oz)    Exam:  General: NAD, looks comfortable lying in bed.  Cardiovascular: mildly tachycardic Respiratory: Bilateral wheezing. No rales or rhonchi Abdomen: soft, non tender, no distention , bowel sounds normal Musculoskeletal: No edema b/l  Data Reviewed: Basic Metabolic Panel:  Recent Labs Lab 05/30/15 1731 05/31/15 0349 06/01/15 0525  NA 139 140 138  K 4.6 4.0 3.7  CL 105 111 110  CO2 GLUCOSE 271* 107* 89  BUN CREATININE 1.32* 1.14* 1.00  CALCIUM 9.6 8.6* 8.8*   Liver Function Tests:  Recent Labs Lab 05/30/15 1731  AST 23  ALT 12*  ALKPHOS 86  BILITOT 1.1  PROT 7.0  ALBUMIN 3.8   CBC:  Recent Labs Lab 05/30/15 1731 05/31/15 0349 06/01/15  0525  WBC 27.3* 17.3* 9.9  NEUTROABS 25.4* 15.8*  --   HGB 12.3 10.9* 10.7*  HCT 36.2 32.2* 32.2*  MCV 92.3 92.3 92.5  PLT 364 271 278   Cardiac Enzymes:  Recent Labs Lab 05/30/15 2322 05/31/15 0349 05/31/15 1105  TROPONINI <0.03 0.03 0.03   BNP (last 3 results)  Recent Labs  05/30/15 1731  BNP 289.0*    CBG:  Recent Labs Lab 05/31/15 1137  05/31/15 1625 05/31/15 2001 06/01/15 0012 06/01/15 0413  GLUCAP 69 92 139* 95 94    Recent Results (from the past 240 hour(s))  Blood culture (routine x 2)     Status: None (Preliminary result)   Collection Time: 05/30/15  5:38 PM  Result Value Ref Range Status   Specimen Description BLOOD LEFT ARM  Final   Special Requests BOTTLES DRAWN AEROBIC AND ANAEROBIC 6CC EACH  Final   Culture NO GROWTH < 24 HOURS  Final   Report Status PENDING  Incomplete  Blood culture (routine x 2)     Status: None (Preliminary result)   Collection Time: 05/30/15  5:42 PM  Result Value Ref Range Status   Specimen Description BLOOD LEFT HAND  Final   Special Requests BOTTLES DRAWN AEROBIC AND ANAEROBIC 6CC EACH  Final   Culture NO GROWTH < 24 HOURS  Final   Report Status PENDING  Incomplete  MRSA PCR Screening     Status: None   Collection Time: 05/30/15 11:40 PM  Result Value Ref Range Status   MRSA by PCR NEGATIVE NEGATIVE Final    Comment:        The GeneXpert MRSA Assay (FDA approved for NASAL specimens only), is one component of a comprehensive MRSA colonization surveillance program. It is not intended to diagnose MRSA infection nor to guide or monitor treatment for MRSA infections.      Studies: Dg Chest 2 View  05/30/2015  CLINICAL DATA:  Shortness of Breath EXAM: CHEST  2 VIEW COMPARISON:  10/29/2014 FINDINGS: Cardiomediastinal silhouette is stable. No acute infiltrate or pleural effusion. No pulmonary edema. Mild degenerative changes thoracic spine. IMPRESSION: No active cardiopulmonary disease. Electronically Signed   By: Natasha MeadLiviu  Pop M.D.   On: 05/30/2015 18:30   Ct Angio Chest Pe W/cm &/or Wo Cm  05/30/2015  CLINICAL DATA:  Hypoxia. EXAM: CT ANGIOGRAPHY CHEST WITH CONTRAST TECHNIQUE: Multidetector CT imaging of the chest was performed using the standard protocol during bolus administration of intravenous contrast. Multiplanar CT image reconstructions and MIPs were obtained to evaluate  the vascular anatomy. CONTRAST:  75mL OMNIPAQUE IOHEXOL 350 MG/ML SOLN COMPARISON:  09/11/2007 FINDINGS: Cardiovascular: There is good opacification of the pulmonary arteries. There is no pulmonary embolism. The thoracic aorta is normal in caliber and intact. Lungs: There is focal ground-glass opacity in the lateral inferior periphery of the lingula. This may represent early infectious infiltrate, but noninfectious inflammatory change or early atelectasis are also considerations. Lungs are otherwise clear. Central airways: Patent Effusions: None Lymphadenopathy: None Esophagus: Unremarkable Upper abdomen: No significant abnormality Musculoskeletal: Bridging calcifications at the anterior aspect of the vertebral column in the mid to lower thoracic spine, consistent with diffuse idiopathic skeletal hyperostosis. No significant skeletal lesions. Review of the MIP images confirms the above findings. IMPRESSION: 1. Negative for acute pulmonary embolism. 2. Patchy ground-glass opacity in the lateral lingula. This could represent early infectious infiltrate or atelectasis. Electronically Signed   By: Ellery Plunkaniel R Mitchell M.D.   On: 05/30/2015 21:44    Scheduled Meds: .  amLODipine  5 mg Oral Daily  . azithromycin  500 mg Intravenous Q24H  . cefTRIAXone (ROCEPHIN)  IV  1 g Intravenous Q24H  . clopidogrel  75 mg Oral Daily  . enoxaparin (LOVENOX) injection  40 mg Subcutaneous Q24H  . escitalopram  10 mg Oral Daily  . feeding supplement (ENSURE ENLIVE)  237 mL Oral BID BM  . folic acid  1 mg Oral Daily  . guaiFENesin  600 mg Oral BID  . insulin aspart  0-9 Units Subcutaneous 6 times per day  . isosorbide mononitrate  30 mg Oral Daily  . metoprolol  25 mg Oral BID  . simvastatin  20 mg Oral Daily  . topiramate  25 mg Oral BID   Continuous Infusions:    Principal Problem:   Acute respiratory failure with hypoxia (HCC) Active Problems:   Morbid obesity (HCC)   Obstructive sleep apnea   CAD (coronary artery  disease)   Pulmonary hypertension, moderate to severe (HCC)   CAP (community acquired pneumonia)   Diabetes mellitus without complication (HCC)   Syncope   Metabolic encephalopathy    Time spent: 25 minutes   [employer]. MD [facility] Pager [phone]. If 7PM-7AM, please contact night-coverage at [email or web address], password [facility] 06/01/2015, 6:38 AM  LOS: 2 days      By signing my name below, I, Zadie Cleverly, attest that this documentation has been prepared under the direction and in the presence of Erick Blinks, MD. Electronically signed: Zadie Cleverly, Scribe. 06/01/2015 9:05am  I, Dr. Erick Blinks, personally performed the services described in this documentaiton. All medical record entries made by the scribe were at my direction and in my presence. I have reviewed the chart and agree that the record reflects my personal performance and is accurate and complete  Erick Blinks, MD, 06/01/2015 9:23 AM

## 2015-06-01 NOTE — Consult Note (Signed)
Note dictated on the call-in system. There seems to be some problem with Epic/dragon interface. She seems to be on the appropriate treatment. Based on her echocardiogram I am not sure that the diagnosis of pulmonary hypertension is correct

## 2015-06-02 LAB — CBC
HCT: 30 % — ABNORMAL LOW (ref 36.0–46.0)
HEMOGLOBIN: 10 g/dL — AB (ref 12.0–15.0)
MCH: 30.8 pg (ref 26.0–34.0)
MCHC: 33.3 g/dL (ref 30.0–36.0)
MCV: 92.3 fL (ref 78.0–100.0)
Platelets: 289 10*3/uL (ref 150–400)
RBC: 3.25 MIL/uL — ABNORMAL LOW (ref 3.87–5.11)
RDW: 12.7 % (ref 11.5–15.5)
WBC: 8.6 10*3/uL (ref 4.0–10.5)

## 2015-06-02 LAB — BASIC METABOLIC PANEL
ANION GAP: 5 (ref 5–15)
BUN: 15 mg/dL (ref 6–20)
CALCIUM: 8.6 mg/dL — AB (ref 8.9–10.3)
CO2: 23 mmol/L (ref 22–32)
CREATININE: 1.03 mg/dL — AB (ref 0.44–1.00)
Chloride: 111 mmol/L (ref 101–111)
GFR calc Af Amer: 59 mL/min — ABNORMAL LOW (ref >60)
GFR, EST NON AFRICAN AMERICAN: 51 mL/min — AB (ref >60)
GLUCOSE: 105 mg/dL — AB (ref 65–99)
Potassium: 3.4 mmol/L — ABNORMAL LOW (ref 3.5–5.1)
Sodium: 139 mmol/L (ref 135–145)

## 2015-06-02 LAB — GLUCOSE, CAPILLARY
GLUCOSE-CAPILLARY: 136 mg/dL — AB (ref 65–99)
GLUCOSE-CAPILLARY: 259 mg/dL — AB (ref 65–99)
GLUCOSE-CAPILLARY: 94 mg/dL (ref 65–99)
Glucose-Capillary: 109 mg/dL — ABNORMAL HIGH (ref 65–99)
Glucose-Capillary: 258 mg/dL — ABNORMAL HIGH (ref 65–99)
Glucose-Capillary: 350 mg/dL — ABNORMAL HIGH (ref 65–99)

## 2015-06-02 MED ORDER — METHYLPREDNISOLONE SODIUM SUCC 125 MG IJ SOLR
60.0000 mg | Freq: Two times a day (BID) | INTRAMUSCULAR | Status: DC
  Administered 2015-06-02 – 2015-06-04 (×5): 60 mg via INTRAVENOUS
  Filled 2015-06-02 (×5): qty 2

## 2015-06-02 MED ORDER — METOPROLOL TARTRATE 50 MG PO TABS
50.0000 mg | ORAL_TABLET | Freq: Two times a day (BID) | ORAL | Status: DC
  Administered 2015-06-02 – 2015-06-04 (×4): 50 mg via ORAL
  Filled 2015-06-02 (×4): qty 1

## 2015-06-02 MED ORDER — METOPROLOL TARTRATE 25 MG PO TABS
25.0000 mg | ORAL_TABLET | Freq: Once | ORAL | Status: AC
  Administered 2015-06-02: 25 mg via ORAL
  Filled 2015-06-02: qty 1

## 2015-06-02 MED ORDER — METOPROLOL TARTRATE 25 MG PO TABS: 25.0000 mg | ORAL_TABLET | Freq: Once | ORAL | Status: DC

## 2015-06-02 NOTE — Progress Notes (Signed)
Patient does not want to wear BIPAP tonight, Patient is still on High Flow, decreased this down to [facility]. Patient is 99% and is tolerating well. BIPAP is on standby at beside. RT will continue to monitor.

## 2015-06-02 NOTE — Progress Notes (Signed)
Subjective: She is much more alert today. She says she feels okay. She is on high flow oxygen but has not required BiPAP last night.  Objective: Vital signs in last 24 hours: Temp:  [97.8 F (36.6 C)-99.6 F (37.6 C)] 98.3 F (36.8 C) (11/04 0400) Pulse Rate:  [26-122] 109 (11/04 0750) Resp:  [16-29] 21 (11/04 0500) BP: (138-189)/(51-94) 161/62 mmHg (11/04 0750) SpO2:  [95 %-100 %] 95 % (11/04 0500) Weight:  [98.2 kg (216 lb 7.9 oz)] 98.2 kg (216 lb 7.9 oz) (11/04 0500) Weight change: -1.2 kg (-2 lb 10.3 oz) Last BM Date: 06/01/15  Intake/Output from previous day: 11/03 0701 - 11/04 0700 In: 780 [P.O.:480; IV Piggyback:300] Out: 1775 [Urine:1775]  PHYSICAL EXAM General appearance: alert, cooperative, mild distress and morbidly obese Resp: rhonchi bilaterally Cardio: regular rate and rhythm, S1, S2 normal, no murmur, click, rub or gallop GI: soft, non-tender; bowel sounds normal; no masses,  no organomegaly Extremities: extremities normal, atraumatic, no cyanosis or edema  Lab Results:  Results for orders placed or performed during the hospital encounter of 05/30/15 (from the past 48 hour(s))  Troponin I (q 6hr x 3)     Status: None   Collection Time: 05/31/15 11:05 AM  Result Value Ref Range   Troponin I 0.03 <0.031 ng/mL    Comment:        NO INDICATION OF MYOCARDIAL INJURY.   Glucose, capillary     Status: None   Collection Time: 05/31/15 11:37 AM  Result Value Ref Range   Glucose-Capillary 69 65 - 99 mg/dL   Comment 1 Notify RN    Comment 2 Document in Chart   Glucose, capillary     Status: None   Collection Time: 05/31/15  4:25 PM  Result Value Ref Range   Glucose-Capillary 92 65 - 99 mg/dL   Comment 1 Notify RN    Comment 2 Document in Chart   Glucose, capillary     Status: Abnormal   Collection Time: 05/31/15  8:01 PM  Result Value Ref Range   Glucose-Capillary 139 (H) 65 - 99 mg/dL  Glucose, capillary     Status: None   Collection Time: 06/01/15 12:12  AM  Result Value Ref Range   Glucose-Capillary 95 65 - 99 mg/dL   Comment 1 Notify RN   Glucose, capillary     Status: None   Collection Time: 06/01/15  4:13 AM  Result Value Ref Range   Glucose-Capillary 94 65 - 99 mg/dL   Comment 1 Notify RN   Procalcitonin     Status: None   Collection Time: 06/01/15  5:25 AM  Result Value Ref Range   Procalcitonin 0.60 ng/mL    Comment:        Interpretation: PCT > 0.5 ng/mL and <= 2 ng/mL: Systemic infection (sepsis) is possible, but other conditions are known to elevate PCT as well. (NOTE)         ICU PCT Algorithm               Non ICU PCT Algorithm    ----------------------------     ------------------------------         PCT < 0.25 ng/mL                 PCT < 0.1 ng/mL     Stopping of antibiotics            Stopping of antibiotics       strongly encouraged.  strongly encouraged.    ----------------------------     ------------------------------       PCT level decrease by               PCT < 0.25 ng/mL       >= 80% from peak PCT       OR PCT 0.25 - 0.5 ng/mL          Stopping of antibiotics                                             encouraged.     Stopping of antibiotics           encouraged.    ----------------------------     ------------------------------       PCT level decrease by              PCT >= 0.25 ng/mL       < 80% from peak PCT        AND PCT >= 0.5 ng/mL             Continuing antibiotics                                              encouraged.       Continuing antibiotics            encouraged.    ----------------------------     ------------------------------     PCT level increase compared          PCT > 0.5 ng/mL         with peak PCT AND          PCT >= 0.5 ng/mL             Escalation of antibiotics                                          strongly encouraged.      Escalation of antibiotics        strongly encouraged.   Basic metabolic panel     Status: Abnormal   Collection Time: 06/01/15   5:25 AM  Result Value Ref Range   Sodium 138 135 - 145 mmol/L   Potassium 3.7 3.5 - 5.1 mmol/L   Chloride 110 101 - 111 mmol/L   CO2 22 22 - 32 mmol/L   Glucose, Bld 89 65 - 99 mg/dL   BUN 15 6 - 20 mg/dL   Creatinine, Ser 1.00 0.44 - 1.00 mg/dL   Calcium 8.8 (L) 8.9 - 10.3 mg/dL   GFR calc non Af Amer 53 (L) >60 mL/min   GFR calc Af Amer >60 >60 mL/min    Comment: (NOTE) The eGFR has been calculated using the CKD EPI equation. This calculation has not been validated in all clinical situations. eGFR's persistently <60 mL/min signify possible Chronic Kidney Disease.    Anion gap 6 5 - 15  CBC     Status: Abnormal   Collection Time: 06/01/15  5:25 AM  Result Value Ref Range   WBC 9.9 4.0 - 10.5 K/uL   RBC 3.48 (L) 3.87 - 5.11 MIL/uL  Hemoglobin 10.7 (L) 12.0 - 15.0 g/dL   HCT 32.2 (L) 36.0 - 46.0 %   MCV 92.5 78.0 - 100.0 fL   MCH 30.7 26.0 - 34.0 pg   MCHC 33.2 30.0 - 36.0 g/dL   RDW 13.0 11.5 - 15.5 %   Platelets 278 150 - 400 K/uL  Glucose, capillary     Status: None   Collection Time: 06/01/15  7:13 AM  Result Value Ref Range   Glucose-Capillary 66 65 - 99 mg/dL   Comment 1 Notify RN    Comment 2 Document in Chart   Glucose, capillary     Status: None   Collection Time: 06/01/15  8:21 AM  Result Value Ref Range   Glucose-Capillary 89 65 - 99 mg/dL   Comment 1 Notify RN    Comment 2 Document in Chart   Glucose, capillary     Status: Abnormal   Collection Time: 06/01/15 11:39 AM  Result Value Ref Range   Glucose-Capillary 142 (H) 65 - 99 mg/dL   Comment 1 Notify RN    Comment 2 Document in Chart   Glucose, capillary     Status: Abnormal   Collection Time: 06/01/15  4:21 PM  Result Value Ref Range   Glucose-Capillary 124 (H) 65 - 99 mg/dL   Comment 1 Notify RN    Comment 2 Document in Chart   Glucose, capillary     Status: Abnormal   Collection Time: 06/01/15  7:34 PM  Result Value Ref Range   Glucose-Capillary 166 (H) 65 - 99 mg/dL   Comment 1 Notify RN    Glucose, capillary     Status: Abnormal   Collection Time: 06/02/15 12:07 AM  Result Value Ref Range   Glucose-Capillary 136 (H) 65 - 99 mg/dL   Comment 1 Notify RN   Glucose, capillary     Status: Abnormal   Collection Time: 06/02/15  4:41 AM  Result Value Ref Range   Glucose-Capillary 109 (H) 65 - 99 mg/dL   Comment 1 Notify RN   Basic metabolic panel     Status: Abnormal   Collection Time: 06/02/15  4:44 AM  Result Value Ref Range   Sodium 139 135 - 145 mmol/L   Potassium 3.4 (L) 3.5 - 5.1 mmol/L   Chloride 111 101 - 111 mmol/L   CO2 23 22 - 32 mmol/L   Glucose, Bld 105 (H) 65 - 99 mg/dL   BUN 15 6 - 20 mg/dL   Creatinine, Ser 1.03 (H) 0.44 - 1.00 mg/dL   Calcium 8.6 (L) 8.9 - 10.3 mg/dL   GFR calc non Af Amer 51 (L) >60 mL/min   GFR calc Af Amer 59 (L) >60 mL/min    Comment: (NOTE) The eGFR has been calculated using the CKD EPI equation. This calculation has not been validated in all clinical situations. eGFR's persistently <60 mL/min signify possible Chronic Kidney Disease.    Anion gap 5 5 - 15  CBC     Status: Abnormal   Collection Time: 06/02/15  4:44 AM  Result Value Ref Range   WBC 8.6 4.0 - 10.5 K/uL   RBC 3.25 (L) 3.87 - 5.11 MIL/uL   Hemoglobin 10.0 (L) 12.0 - 15.0 g/dL   HCT 30.0 (L) 36.0 - 46.0 %   MCV 92.3 78.0 - 100.0 fL   MCH 30.8 26.0 - 34.0 pg   MCHC 33.3 30.0 - 36.0 g/dL   RDW 12.7 11.5 - 15.5 %   Platelets 289  150 - 400 K/uL  Glucose, capillary     Status: None   Collection Time: 06/02/15  7:46 AM  Result Value Ref Range   Glucose-Capillary 94 65 - 99 mg/dL    ABGS  Recent Labs  05/30/15 2012 05/31/15 0233  PHART 7.313* 7.331*  PO2ART 73.6* 292*  TCO2 15.0  --   HCO3 19.3* 21.2   CULTURES Recent Results (from the past 240 hour(s))  Blood culture (routine x 2)     Status: None (Preliminary result)   Collection Time: 05/30/15  5:38 PM  Result Value Ref Range Status   Specimen Description BLOOD LEFT ARM  Final   Special Requests  BOTTLES DRAWN AEROBIC AND ANAEROBIC 6CC EACH  Final   Culture NO GROWTH 3 DAYS  Final   Report Status PENDING  Incomplete  Blood culture (routine x 2)     Status: None (Preliminary result)   Collection Time: 05/30/15  5:42 PM  Result Value Ref Range Status   Specimen Description BLOOD LEFT HAND  Final   Special Requests BOTTLES DRAWN AEROBIC AND ANAEROBIC 6CC EACH  Final   Culture NO GROWTH 3 DAYS  Final   Report Status PENDING  Incomplete  MRSA PCR Screening     Status: None   Collection Time: 05/30/15 11:40 PM  Result Value Ref Range Status   MRSA by PCR NEGATIVE NEGATIVE Final    Comment:        The GeneXpert MRSA Assay (FDA approved for NASAL specimens only), is one component of a comprehensive MRSA colonization surveillance program. It is not intended to diagnose MRSA infection nor to guide or monitor treatment for MRSA infections.    Studies/Results: No results found.  Medications:  Prior to Admission:  Prescriptions prior to admission  Medication Sig Dispense Refill Last Dose  . amLODipine (NORVASC) 5 MG tablet Take 1 tablet (5 mg total) by mouth daily. 90 tablet 3 05/30/2015 at Unknown time  . clopidogrel (PLAVIX) 75 MG tablet Take 75 mg by mouth daily.   05/30/2015 at Unknown time  . Cyanocobalamin (VITAMIN B-12 IJ) Inject as directed every 30 (thirty) days.   Past Month at Unknown time  . escitalopram (LEXAPRO) 10 MG tablet Take 10 mg by mouth daily.   05/30/2015 at Unknown time  . esomeprazole (NEXIUM) 40 MG capsule Take 40 mg by mouth daily before breakfast.   05/30/2015 at Unknown time  . folic acid (FOLVITE) 1 MG tablet Take 1 mg by mouth daily.   05/30/2015 at Unknown time  . glimepiride (AMARYL) 4 MG tablet Take 4 mg by mouth 2 (two) times daily.    05/30/2015 at Unknown time  . isosorbide mononitrate (IMDUR) 30 MG 24 hr tablet Take 30 mg by mouth daily.   05/30/2015 at Unknown time  . metFORMIN (GLUCOPHAGE) 1000 MG tablet Take 1,000 mg by mouth 2 (two) times daily  with a meal.    05/30/2015 at Unknown time  . metoprolol (LOPRESSOR) 50 MG tablet Take 25 mg by mouth 2 (two) times daily.   05/30/2015 at 900a  . simvastatin (ZOCOR) 20 MG tablet Take 20 mg by mouth daily.    05/30/2015 at Unknown time  . topiramate (TOPAMAX) 25 MG tablet Take 25 mg by mouth 2 (two) times daily.   05/30/2015 at Unknown time  . valsartan (DIOVAN) 160 MG tablet Take 160 mg by mouth daily.   05/30/2015 at Unknown time  . zolpidem (AMBIEN) 5 MG tablet Take 5 mg by mouth at  bedtime as needed for sleep.   unknown   Scheduled: . amLODipine  5 mg Oral Daily  . azithromycin  500 mg Intravenous Q24H  . cefTRIAXone (ROCEPHIN)  IV  1 g Intravenous Q24H  . clopidogrel  75 mg Oral Daily  . enoxaparin (LOVENOX) injection  40 mg Subcutaneous Q24H  . escitalopram  10 mg Oral Daily  . feeding supplement (ENSURE ENLIVE)  237 mL Oral BID BM  . folic acid  1 mg Oral Daily  . guaiFENesin  1,200 mg Oral BID  . insulin aspart  0-9 Units Subcutaneous 6 times per day  . ipratropium-albuterol  3 mL Nebulization Q4H  . isosorbide mononitrate  30 mg Oral Daily  . metoprolol  25 mg Oral BID  . simvastatin  20 mg Oral Daily  . topiramate  25 mg Oral BID   Continuous:  KCL:[id number], hydrALAZINE, ipratropium, levalbuterol  Assesment: She has acute hypoxic respiratory failure. This is felt to be due to community-acquired pneumonia although we cannot document that by radiologic studies. I agree that is the most likely problem. Yesterday she had severe problems with encephalopathy but today she is much better. I believe the diagnosis of pulmonary hypertension is an error or has resolved. Principal Problem:   Acute respiratory failure with hypoxia (HCC) Active Problems:   Morbid obesity (HCC)   Obstructive sleep apnea   CAD (coronary artery disease)   Pulmonary hypertension, moderate to severe ([place])   CAP (community acquired pneumonia)   Diabetes mellitus without complication ([place])   Syncope    Metabolic encephalopathy    Plan: Continue current treatments. She has improved substantially since I saw her yesterday    LOS: 3 days   [name] Mueller L 06/02/2015, 8:34 AM

## 2015-06-02 NOTE — Progress Notes (Signed)
Inpatient Diabetes Program Recommendations  AACE/ADA: New Consensus Statement on Inpatient Glycemic Control (2015)  Target Ranges:  Prepandial:   less than 140 mg/dL      Peak postprandial:   less than 180 mg/dL (1-2 hours)      Critically ill patients:  140 - 180 mg/dL   09/04/7409/4/16 Cbgs:  128,78,676109,94,259 Noted addition of Solumedrol 60 mg q 12 hours.   May need to increase insulin with while on steroids. Home DM meds:  Amaryl 4 mg bid, glucophage 1000 mg bid  Hospital DM meds: Sensitive correction scale q 4 hours.  Mellissa KohutSherrie Markail Diekman RD, CDE

## 2015-06-02 NOTE — Progress Notes (Signed)
[facility] PROGRESS NOTE  [name] Mueller WJX:[id number] DOB: 1935-06-29 DOA: 05/30/2015 PCP: Cassell Smiles., MD  Assessment/Plan: 1. Acute respiratory failure with hypoxia, improving with supplemental O2. Pt is currently on high flow [place], however did not require BIPAP last night.   Will continue to try to wean off O2 as tolerated. Pulmonary input appreciated. Patient continues to have mild wheezing despite nebulizers, will give a trial of low-dose steroids.  2. CAP. Currently afebrile. WBC now WNL. Blood and sputum cultures pending, no growth to date. Lactic acid WNL following IVF. Influenza negative. CXR and CT chest unremarkable.  She is currently on rocephin and azithromycin.  3. Metabolic encephalopathy, likely due to infection/hypoxia. Resolved with abx, IVF, and supplemental O2. No focal neurological deficits.  4. Morbid obesity. 5. OSA, noncompliant with CPAP at night.  6. CAD, EKG unremarkable. Serial troponins negative. Patient denies any chest pain.  7. DM type 2, stable. Continue SSI. 8. Syncope, likely due to hypoxia.  9. Essential HTN uncontrolled.  Patient is also tachycardic. Will increase BB and continue Imdur/Norvasc.    Code Status: Full DVT prophylaxis: Lovenox  Family Communication: Husband at bedside. Discussed with patient who understands and has no concerns at this time. Disposition Plan: Anticipate discharge in 1-2 days.    Consultants:  Pulmology   Procedures:  ECHO Study Conclusions  - Left ventricle: The cavity size was normal. Wall thickness was increased in a pattern of moderate LVH. Systolic function was normal. The estimated ejection fraction was in the range of 55% to 60%. Diastolic function is abnormal, indeterminate grade. Wall motion was normal; there were no regional wall motion abnormalities. - Aortic valve: Mildly calcified annulus. Trileaflet; mildly thickened leaflets. Valve area (VTI): 1.54 cm^2. Valve area (Vmax):  1.54 cm^2. - Mitral valve: Mildly calcified annulus. Mildly thickened leaflets . There was mild regurgitation. - Left atrium: The atrium was moderately dilated. - Technically adequate study.  Antibiotics:  Rocephin 11/1>>  Azithromycin 11/2>>  HPI/Subjective: Feels a little better today. Able to ambulate on O2 without difficulty.   Objective: Filed Vitals:   06/02/15 0500  BP:   Pulse: 107  Temp:   Resp: 21    Intake/Output Summary (Last 24 hours) at 06/02/15 0742 Last data filed at 06/02/15 0500  Gross per 24 hour  Intake    780 ml  Output   1775 ml  Net   -995 ml   Filed Weights   05/31/15 0500 06/01/15 0500 06/02/15 0500  Weight: 98.1 kg (216 lb 4.3 oz) 99.4 kg (219 lb 2.2 oz) 98.2 kg (216 lb 7.9 oz)    Exam:  General: NAD, looks comfortable. Speaking in full sentences. Cardiovascular: Regular rhythm, mildly tachycardic, S1, S2  Respiratory: Wheezing bilaterally, No rales or rhonchi Abdomen: soft, non tender, no distention , bowel sounds normal Musculoskeletal: No edema b/l  Data Reviewed: Basic Metabolic Panel:  Recent Labs Lab 05/30/15 1731 05/31/15 0349 06/01/15 0525 06/02/15 0444  NA 139 140 138 139  K 4.6 4.0 3.7 3.4*  CL 105 111 110 111  CO2 GLUCOSE 271* 107* 89 105*  BUN CREATININE 1.32* 1.14* 1.00 1.03*  CALCIUM 9.6 8.6* 8.8* 8.6*   Liver Function Tests:  Recent Labs Lab 05/30/15 1731  AST 23  ALT 12*  ALKPHOS 86  BILITOT 1.1  PROT 7.0  ALBUMIN 3.8   CBC:  Recent Labs Lab 05/30/15 1731 05/31/15 0349 06/01/15 0525 06/02/15 0444  WBC 27.3*  17.3* 9.9 8.6  NEUTROABS 25.4* 15.8*  --   --   HGB 12.3 10.9* 10.7* 10.0*  HCT 36.2 32.2* 32.2* 30.0*  MCV 92.3 92.3 92.5 92.3  PLT 364 271 278 289   Cardiac Enzymes:  Recent Labs Lab 05/30/15 2322 05/31/15 0349 05/31/15 1105  TROPONINI <0.03 0.03 0.03   BNP (last 3 results)  Recent Labs  05/30/15 1731  BNP 289.0*    CBG:  Recent  Labs Lab 06/01/15 1139 06/01/15 1621 06/01/15 1934 06/02/15 0007 06/02/15 0441  GLUCAP 142* 124* 166* 136* 109*    Recent Results (from the past 240 hour(s))  Blood culture (routine x 2)     Status: None (Preliminary result)   Collection Time: 05/30/15  5:38 PM  Result Value Ref Range Status   Specimen Description BLOOD LEFT ARM  Final   Special Requests BOTTLES DRAWN AEROBIC AND ANAEROBIC 6CC EACH  Final   Culture NO GROWTH 3 DAYS  Final   Report Status PENDING  Incomplete  Blood culture (routine x 2)     Status: None (Preliminary result)   Collection Time: 05/30/15  5:42 PM  Result Value Ref Range Status   Specimen Description BLOOD LEFT HAND  Final   Special Requests BOTTLES DRAWN AEROBIC AND ANAEROBIC 6CC EACH  Final   Culture NO GROWTH 3 DAYS  Final   Report Status PENDING  Incomplete  MRSA PCR Screening     Status: None   Collection Time: 05/30/15 11:40 PM  Result Value Ref Range Status   MRSA by PCR NEGATIVE NEGATIVE Final    Comment:        The GeneXpert MRSA Assay (FDA approved for NASAL specimens only), is one component of a comprehensive MRSA colonization surveillance program. It is not intended to diagnose MRSA infection nor to guide or monitor treatment for MRSA infections.      Studies: No results found.  Scheduled Meds: . amLODipine  5 mg Oral Daily  . azithromycin  500 mg Intravenous Q24H  . cefTRIAXone (ROCEPHIN)  IV  1 g Intravenous Q24H  . clopidogrel  75 mg Oral Daily  . enoxaparin (LOVENOX) injection  40 mg Subcutaneous Q24H  . escitalopram  10 mg Oral Daily  . feeding supplement (ENSURE ENLIVE)  237 mL Oral BID BM  . folic acid  1 mg Oral Daily  . guaiFENesin  1,200 mg Oral BID  . insulin aspart  0-9 Units Subcutaneous 6 times per day  . ipratropium-albuterol  3 mL Nebulization Q4H  . isosorbide mononitrate  30 mg Oral Daily  . metoprolol  25 mg Oral BID  . simvastatin  20 mg Oral Daily  . topiramate  25 mg Oral BID   Continuous  Infusions:    Principal Problem:   Acute respiratory failure with hypoxia (HCC) Active Problems:   Morbid obesity (HCC)   Obstructive sleep apnea   CAD (coronary artery disease)   Pulmonary hypertension, moderate to severe (HCC)   CAP (community acquired pneumonia)   Diabetes mellitus without complication (HCC)   Syncope   Metabolic encephalopathy    Time spent: 20 minutes   [employer]. MD [facility] Pager [phone]. If 7PM-7AM, please contact night-coverage at [email or web address], password [facility] 06/02/2015, 7:42 AM  LOS: 3 days     By signing my name below, I, Burnett Harry, attest that this documentation has been prepared under the direction and in the presence of [facility]. MD Electronically Signed: Burnett Harry, Scribe. 06/02/2015 10:12am   I,  Dr. Erick BlinksJehanzeb Ayslin Kundert, personally performed the services described in this documentaiton. All medical record entries made by the scribe were at my direction and in my presence. I have reviewed the chart and agree that the record reflects my personal performance and is accurate and complete  Erick BlinksJehanzeb Shreena Baines, MD, 06/02/2015 10:23 AM

## 2015-06-02 NOTE — Care Management Important Message (Signed)
Important Message  Patient Details  Name: Katherine MantleShirley S Favela MRN: [id number] Date of Birth: [birth year]   Medicare Important Message Given:  Yes-second notification given    Malcolm MetroChildress, Kitt Minardi Demske, RN 06/02/2015, 11:20 AM

## 2015-06-02 NOTE — Care Management Note (Signed)
Case Management Note  Patient Details  Name: [name] MantleShirley S Mueller MRN: [id number] Date of Birth: Sep 12, 1934  Subjective/Objective:                  Pt is from home husband and is ind at baseline. Pt has no DME or HH services prior to admission.   Action/Plan: Pt plans to return home with husband. PT has recommended HH PT. Pt is unsure at this time if she is agreeable and will decide at DC. Weekend RN may make referral if needed. Pt may need home O2 at DC and will need RW. R[facility] has been ordered and [facility]. Alroy BailiffLInda Lothian, of [facility], has been made aware and will obtain pt info from chart. Home O2 assessment will be provided prior to DC. Will cont to follow.   Expected Discharge Date:    06/04/2015              Expected Discharge Plan:  Home w Home Health Services  In-House Referral:  NA  Discharge planning Services  CM Consult  Post Acute Care Choice:  NA, Durable Medical Equipment, Home Health Choice offered to:  NA, Patient  DME Arranged:  [place] rolling DME Agency:  [facility]  HH Arranged:    HH Agency:     Status of Service:  In process, will continue to follow  Medicare Important Message Given:  Yes-second notification given Date Medicare IM Given:    Medicare IM give by:    Date Additional Medicare IM Given:    Additional Medicare Important Message give by:     If discussed at Long Length of Stay Meetings, dates discussed:    Additional Comments:  Malcolm MetroChildress, Beatrice Sehgal Demske, RN 06/02/2015, 4:34 PM

## 2015-06-02 NOTE — Consult Note (Signed)
NAMJeannette Mueller:  Mueller, [name]                ACCOUNT NO.:  [id number]  MEDICAL RECORD NO.:  [id number]  LOCATION:  IC04                          FACILITY:  [facility]  PHYSICIAN:  Lorynn Moeser L. Juanetta GoslingHawkins, M.D.DATE OF BIRTH:  [birth year]  DATE OF CONSULTATION: DATE OF DISCHARGE:                                CONSULTATION   The patient of [facility] in the intensive care unit.  REASON FOR CONSULTATION:  Respiratory failure, history of pulmonary hypertension.  HISTORY OF PRESENT ILLNESS:  This is a 80 year old who came to the hospital on May 30, 2015, with shortness of breath, cough, chills, potential fever, and hypoxia.  She had a syncopal episode in her doctor's office and was sent to the emergency department.  When she was seen in the emergency department, she was still hypoxic on 3 L of oxygen.  She was confused.  She was on BiPAP and admitted to the hospital.  She was felt to have pneumonia, although this has not shown on her x-ray.  Her hospital course so far, she had improved, then had more trouble, ended up back on BiPAP, and she had some trouble with taking Ambien last night, causing her to be confused, so she is difficult to arouse right now.  PAST MEDICAL HISTORY:  Otherwise, is positive for coronary artery occlusive disease, diabetes mellitus, hypertension, morbid obesity, nonsustained ventricular tachycardia, obstructive sleep apnea.  She has a history of pulmonary hypertension, but echocardiogram this admission does not show elevated pulmonary artery pressure.  MEDICATIONS:  I have reviewed her medications both in the hospital and at home.  ALLERGIES:  She is allergic to EZETIMIBE and LISINOPRIL.  SOCIAL HISTORY:  She is a nonsmoker.  Does not drink.  Does not use any alcohol and she is a lifetime nonsmoker.  FAMILY HISTORY:  Really unknown.  PHYSICAL EXAMINATION:  VITAL SIGNS:  She is on BiPAP.  Temperature 100.5, pulse 108, respirations 34, blood pressure  188/75. GENERAL:  She is pretty unresponsive now, but this is probably because of taking the Ambien. NECK:  Supple.  Her pupils are reactive.  Mucous membranes are moist. PULMONARY:  Her chest shows some rhonchi bilaterally. CARDIOVASCULAR:  Her heart is regular without gallop. ABDOMEN:  Soft.  ASSESSMENT AND PLAN:  She has acute hypoxic respiratory failure and is being treated for pneumonia which I think is appropriate.  She does not seem to have influenza.  She has required BiPAP.  She was said to have moderate-to-severe pulmonary hypertension, but I do not think that is correct now.  Cardiac echo shows a normal pulmonary artery pressures. She has metabolic encephalopathy which is multifactorial.  I agree I think her syncope was related to hypoxia.  My plan at this point would be to continue with her current treatments and medications.  No changes in meds.  Leave her on BiPAP as much as needed and then decide what she needs to do from there.  Thanks for allowing me to see her with you.     Jerris Fleer L. Juanetta GoslingHawkins, M.D.     ELH/MEDQ  D:  06/01/2015  T:  06/02/2015  Job:  [id number]

## 2015-06-02 NOTE — Evaluation (Signed)
Physical Therapy Evaluation Patient Details Name: [name] Mueller MRN: [id number] DOB: 08-23-1934 Today's Date: 06/02/2015   History of Present Illness  80 yo female h/o MO, osa, severe pHTN, CAD, DM comes in from PCP office. She went to see her PCP today for several days of sob, coughing a lot and not feeling well. Has been having chills but does not know about fever. Pt is alone right now and is a little confused so unsure how reliable history is. She has been having productive cough for four days. Denies any swelling anywhere. No chest pain. In the PCP office her oxygen sats were mid 70s and she was tachypneic. She passed out briefly and sent to ED. In ED on arrival her oxygen was still low 80% on 3 liters. She has no need for supplemental home oxygen chronically. On arrival she was confused, hypoxic and placed on bipap. She has been on bipap for several hours, and appears to be mentating better, but still not oriented to time (supposedly normal at baseline). She says her breathing is better. Also per RT her respiratory status has also improved with treatment in the ED. Pt referred for admission for acute hypoxic respiratory failure due to likely pna.  Clinical Impression   Pt was seen for evaluation.  She was alert and oriented, very pleasant and cooperative.  She is currently on 5 L O2/min with O2 sat=94%.  She lives with her husband of 61 years and is normally independent at home with a cane.  She is not normally O2 dependent.  She was found to have strength WNL but is deconditioned due to recent illness.  She now needs a walker for gait and will need one for home use.  She is independent in transfers and her balance is WNL.  O2 sat=90% after ambulating 150' with a walker.  Whether she has HHPT at d/c is up to her.  She wants to think about it.    Follow Up Recommendations Home health PT (per pt preference)    Equipment Recommendations  Rolling walker with 5" wheels     Recommendations for Other Services       Precautions / Restrictions Precautions Precautions: None Restrictions Weight Bearing Restrictions: No      Mobility  Bed Mobility Overal bed mobility: Modified Independent                Transfers Overall transfer level: Modified independent Equipment used: Rolling walker (2 wheeled)                Ambulation/Gait Ambulation/Gait assistance: Supervision Ambulation Distance (Feet): 150 Feet Assistive device: Rolling walker (2 wheeled) Gait Pattern/deviations: WFL(Within Functional Limits) Gait velocity: appropriate for situation Gait velocity interpretation: <1.8 ft/sec, indicative of risk for recurrent falls    Stairs            Wheelchair Mobility    Modified Rankin (Stroke Patients Only)       Balance Overall balance assessment: No apparent balance deficits (not formally assessed)                                           Pertinent Vitals/Pain Pain Assessment: No/denies pain    Home Living Family/patient expects to be discharged to:: Private residence Living Arrangements: Spouse/significant other Available Help at Discharge: Family;Available 24 hours/day Type of Home: House Home Access: Level entry  Home Layout: One level Home Equipment: Cane - single point      Prior Function Level of Independence: Independent with assistive device(s)         Comments: uses a cane for gait     Hand Dominance        Extremity/Trunk Assessment   Upper Extremity Assessment: Overall WFL for tasks assessed           Lower Extremity Assessment: Overall WFL for tasks assessed      Cervical / Trunk Assessment: Kyphotic  Communication   Communication: No difficulties  Cognition Arousal/Alertness: Awake/alert Behavior During Therapy: WFL for tasks assessed/performed Overall Cognitive Status: Within Functional Limits for tasks assessed                       General Comments      Exercises        Assessment/Plan    PT Assessment Patient needs continued PT services  PT Diagnosis Difficulty walking (deconditioned)   PT Problem List Decreased activity tolerance;Decreased mobility;Obesity;Cardiopulmonary status limiting activity  PT Treatment Interventions Gait training;Functional mobility training;Therapeutic exercise   PT Goals (Current goals can be found in the Care Plan section) Acute Rehab PT Goals Patient Stated Goal: none stated PT Goal Formulation: With patient/family Time For Goal Achievement: 06/09/15 Potential to Achieve Goals: Good    Frequency Min 3X/week   Barriers to discharge   none    Co-evaluation               End of Session Equipment Utilized During Treatment: Gait belt;Oxygen Activity Tolerance: Patient tolerated treatment well Patient left: in chair;with call bell/phone within reach;with family/visitor present Nurse Communication: Mobility status         Time: [id number] PT Time Calculation (min) (ACUTE ONLY): 44 min   Charges:   PT Evaluation $Initial PT Evaluation Tier I: 1 Procedure     PT G CodesMyrlene Broker:        Sharnee Douglass L  PT 06/02/2015, 3:56 PM [phone]

## 2015-06-03 LAB — CBC
HCT: 31.3 % — ABNORMAL LOW (ref 36.0–46.0)
Hemoglobin: 10.7 g/dL — ABNORMAL LOW (ref 12.0–15.0)
MCH: 31.4 pg (ref 26.0–34.0)
MCHC: 34.2 g/dL (ref 30.0–36.0)
MCV: 91.8 fL (ref 78.0–100.0)
PLATELETS: 308 10*3/uL (ref 150–400)
RBC: 3.41 MIL/uL — AB (ref 3.87–5.11)
RDW: 11.8 % (ref 11.5–15.5)
WBC: 8.3 10*3/uL (ref 4.0–10.5)

## 2015-06-03 LAB — BASIC METABOLIC PANEL
Anion gap: 8 (ref 5–15)
BUN: 20 mg/dL (ref 6–20)
CHLORIDE: 108 mmol/L (ref 101–111)
CO2: 21 mmol/L — ABNORMAL LOW (ref 22–32)
CREATININE: 0.94 mg/dL (ref 0.44–1.00)
Calcium: 8.9 mg/dL (ref 8.9–10.3)
GFR, EST NON AFRICAN AMERICAN: 57 mL/min — AB (ref >60)
Glucose, Bld: 264 mg/dL — ABNORMAL HIGH (ref 65–99)
POTASSIUM: 3.6 mmol/L (ref 3.5–5.1)
SODIUM: 137 mmol/L (ref 135–145)

## 2015-06-03 LAB — GLUCOSE, CAPILLARY
GLUCOSE-CAPILLARY: 222 mg/dL — AB (ref 65–99)
GLUCOSE-CAPILLARY: 233 mg/dL — AB (ref 65–99)
GLUCOSE-CAPILLARY: 236 mg/dL — AB (ref 65–99)
Glucose-Capillary: 334 mg/dL — ABNORMAL HIGH (ref 65–99)
Glucose-Capillary: 408 mg/dL — ABNORMAL HIGH (ref 65–99)
Glucose-Capillary: 307 mg/dL — ABNORMAL HIGH (ref 65–99)

## 2015-06-03 LAB — PROCALCITONIN: PROCALCITONIN: 0.15 ng/mL

## 2015-06-03 MED ORDER — INSULIN ASPART 100 UNIT/ML ~~LOC~~ SOLN
0.0000 [IU] | Freq: Three times a day (TID) | SUBCUTANEOUS | Status: DC
  Administered 2015-06-04 (×2): 11 [IU] via SUBCUTANEOUS

## 2015-06-03 MED ORDER — INSULIN GLARGINE 100 UNIT/ML ~~LOC~~ SOLN
10.0000 [IU] | Freq: Every day | SUBCUTANEOUS | Status: DC
  Administered 2015-06-03: 10 [IU] via SUBCUTANEOUS
  Filled 2015-06-03 (×2): qty 0.1

## 2015-06-03 MED ORDER — INSULIN ASPART 100 UNIT/ML ~~LOC~~ SOLN
20.0000 [IU] | Freq: Once | SUBCUTANEOUS | Status: AC
  Administered 2015-06-03: 20 [IU] via SUBCUTANEOUS

## 2015-06-03 MED ORDER — INSULIN ASPART 100 UNIT/ML ~~LOC~~ SOLN
0.0000 [IU] | Freq: Every day | SUBCUTANEOUS | Status: DC
  Administered 2015-06-03: 4 [IU] via SUBCUTANEOUS

## 2015-06-03 MED ORDER — IPRATROPIUM-ALBUTEROL 0.5-2.5 (3) MG/3ML IN SOLN
3.0000 mL | RESPIRATORY_TRACT | Status: DC
  Administered 2015-06-03 – 2015-06-04 (×3): 3 mL via RESPIRATORY_TRACT
  Filled 2015-06-03 (×3): qty 3

## 2015-06-03 NOTE — Progress Notes (Signed)
Pt transferring to room 332. Alert and oriented. O2 AT 1L/MIN VIA N/C. PT HAS DRY HACKING COUGH AT PRESENT. FOLEY CATHETER REMOVED. LT FA NSL PATENT. DENIES ANY RESPIRATORY DISTRESS OR DISCOMFORT. TRANSFER REPORT GIVEN TO MARY ANN DICKERSON RN ON 300.

## 2015-06-03 NOTE — Progress Notes (Signed)
Subjective: She looks better. She has no complaints. She is still not requiring BiPAP. She is on pretty high flow oxygen. Her pneumonia seems to be improving  Objective: Vital signs in last 24 hours: Temp:  [97.2 F (36.2 C)-98.7 F (37.1 C)] 97.2 F (36.2 C) (11/05 0746) Pulse Rate:  [82-100] 92 (11/05 0700) Resp:  [15-30] 16 (11/05 0700) BP: (128-192)/(51-85) 135/53 mmHg (11/05 0700) SpO2:  [91 %-99 %] 95 % (11/05 0849) FiO2 (%):  [90 %] 90 % (11/04 2000) Weight:  [97.2 kg (214 lb 4.6 oz)] 97.2 kg (214 lb 4.6 oz) (11/05 0400) Weight change: -1 kg (-2 lb 3.3 oz) Last BM Date: 06/02/15  Intake/Output from previous day: 11/04 0701 - 11/05 0700 In: 1500 [P.O.:1200; IV Piggyback:300] Out: 1250 [Urine:1250]  PHYSICAL EXAM General appearance: alert, cooperative and no distress Resp: diminished breath sounds bilaterally Cardio: regular rate and rhythm, S1, S2 normal, no murmur, click, rub or gallop GI: soft, non-tender; bowel sounds normal; no masses,  no organomegaly Extremities: extremities normal, atraumatic, no cyanosis or edema  Lab Results:  Results for orders placed or performed during the hospital encounter of 05/30/15 (from the past 48 hour(s))  Glucose, capillary     Status: Abnormal   Collection Time: 06/01/15 11:39 AM  Result Value Ref Range   Glucose-Capillary 142 (H) 65 - 99 mg/dL   Comment 1 Notify RN    Comment 2 Document in Chart   Glucose, capillary     Status: Abnormal   Collection Time: 06/01/15  4:21 PM  Result Value Ref Range   Glucose-Capillary 124 (H) 65 - 99 mg/dL   Comment 1 Notify RN    Comment 2 Document in Chart   Glucose, capillary     Status: Abnormal   Collection Time: 06/01/15  7:34 PM  Result Value Ref Range   Glucose-Capillary 166 (H) 65 - 99 mg/dL   Comment 1 Notify RN   Glucose, capillary     Status: Abnormal   Collection Time: 06/02/15 12:07 AM  Result Value Ref Range   Glucose-Capillary 136 (H) 65 - 99 mg/dL   Comment 1 Notify RN    Glucose, capillary     Status: Abnormal   Collection Time: 06/02/15  4:41 AM  Result Value Ref Range   Glucose-Capillary 109 (H) 65 - 99 mg/dL   Comment 1 Notify RN   Basic metabolic panel     Status: Abnormal   Collection Time: 06/02/15  4:44 AM  Result Value Ref Range   Sodium 139 135 - 145 mmol/L   Potassium 3.4 (L) 3.5 - 5.1 mmol/L   Chloride 111 101 - 111 mmol/L   CO2 23 22 - 32 mmol/L   Glucose, Bld 105 (H) 65 - 99 mg/dL   BUN 15 6 - 20 mg/dL   Creatinine, Ser 1.03 (H) 0.44 - 1.00 mg/dL   Calcium 8.6 (L) 8.9 - 10.3 mg/dL   GFR calc non Af Amer 51 (L) >60 mL/min   GFR calc Af Amer 59 (L) >60 mL/min    Comment: (NOTE) The eGFR has been calculated using the CKD EPI equation. This calculation has not been validated in all clinical situations. eGFR's persistently <60 mL/min signify possible Chronic Kidney Disease.    Anion gap 5 5 - 15  CBC     Status: Abnormal   Collection Time: 06/02/15  4:44 AM  Result Value Ref Range   WBC 8.6 4.0 - 10.5 K/uL   RBC 3.25 (L) 3.87 -  5.11 MIL/uL   Hemoglobin 10.0 (L) 12.0 - 15.0 g/dL   HCT 30.0 (L) 36.0 - 46.0 %   MCV 92.3 78.0 - 100.0 fL   MCH 30.8 26.0 - 34.0 pg   MCHC 33.3 30.0 - 36.0 g/dL   RDW 12.7 11.5 - 15.5 %   Platelets 289 150 - 400 K/uL  Glucose, capillary     Status: None   Collection Time: 06/02/15  7:46 AM  Result Value Ref Range   Glucose-Capillary 94 65 - 99 mg/dL  Glucose, capillary     Status: Abnormal   Collection Time: 06/02/15 11:16 AM  Result Value Ref Range   Glucose-Capillary 259 (H) 65 - 99 mg/dL  Glucose, capillary     Status: Abnormal   Collection Time: 06/02/15  4:02 PM  Result Value Ref Range   Glucose-Capillary 258 (H) 65 - 99 mg/dL  Glucose, capillary     Status: Abnormal   Collection Time: 06/02/15  8:08 PM  Result Value Ref Range   Glucose-Capillary 350 (H) 65 - 99 mg/dL  Glucose, capillary     Status: Abnormal   Collection Time: 06/03/15 12:03 AM  Result Value Ref Range   Glucose-Capillary  236 (H) 65 - 99 mg/dL  Glucose, capillary     Status: Abnormal   Collection Time: 06/03/15  4:15 AM  Result Value Ref Range   Glucose-Capillary 233 (H) 65 - 99 mg/dL  Procalcitonin     Status: None   Collection Time: 06/03/15  5:02 AM  Result Value Ref Range   Procalcitonin 0.15 ng/mL    Comment:        Interpretation: PCT (Procalcitonin) <= 0.5 ng/mL: Systemic infection (sepsis) is not likely. Local bacterial infection is possible. (NOTE)         ICU PCT Algorithm               Non ICU PCT Algorithm    ----------------------------     ------------------------------         PCT < 0.25 ng/mL                 PCT < 0.1 ng/mL     Stopping of antibiotics            Stopping of antibiotics       strongly encouraged.               strongly encouraged.    ----------------------------     ------------------------------       PCT level decrease by               PCT < 0.25 ng/mL       >= 80% from peak PCT       OR PCT 0.25 - 0.5 ng/mL          Stopping of antibiotics                                             encouraged.     Stopping of antibiotics           encouraged.    ----------------------------     ------------------------------       PCT level decrease by              PCT >= 0.25 ng/mL       < 80% from peak PCT  AND PCT >= 0.5 ng/mL            Continuin g antibiotics                                              encouraged.       Continuing antibiotics            encouraged.    ----------------------------     ------------------------------     PCT level increase compared          PCT > 0.5 ng/mL         with peak PCT AND          PCT >= 0.5 ng/mL             Escalation of antibiotics                                          strongly encouraged.      Escalation of antibiotics        strongly encouraged.   Basic metabolic panel     Status: Abnormal   Collection Time: 06/03/15  5:02 AM  Result Value Ref Range   Sodium 137 135 - 145 mmol/L   Potassium 3.6 3.5 - 5.1  mmol/L   Chloride 108 101 - 111 mmol/L   CO2 21 (L) 22 - 32 mmol/L   Glucose, Bld 264 (H) 65 - 99 mg/dL   BUN 20 6 - 20 mg/dL   Creatinine, Ser 0.94 0.44 - 1.00 mg/dL   Calcium 8.9 8.9 - 10.3 mg/dL   GFR calc non Af Amer 57 (L) >60 mL/min   GFR calc Af Amer >60 >60 mL/min    Comment: (NOTE) The eGFR has been calculated using the CKD EPI equation. This calculation has not been validated in all clinical situations. eGFR's persistently <60 mL/min signify possible Chronic Kidney Disease.    Anion gap 8 5 - 15  CBC     Status: Abnormal   Collection Time: 06/03/15  5:02 AM  Result Value Ref Range   WBC 8.3 4.0 - 10.5 K/uL   RBC 3.41 (L) 3.87 - 5.11 MIL/uL   Hemoglobin 10.7 (L) 12.0 - 15.0 g/dL   HCT 31.3 (L) 36.0 - 46.0 %   MCV 91.8 78.0 - 100.0 fL   MCH 31.4 26.0 - 34.0 pg   MCHC 34.2 30.0 - 36.0 g/dL   RDW 11.8 11.5 - 15.5 %   Platelets 308 150 - 400 K/uL  Glucose, capillary     Status: Abnormal   Collection Time: 06/03/15  7:36 AM  Result Value Ref Range   Glucose-Capillary 222 (H) 65 - 99 mg/dL   Comment 1 Notify RN    Comment 2 Document in Chart     ABGS No results for input(s): PHART, PO2ART, TCO2, HCO3 in the last 72 hours.  Invalid input(s): PCO2 CULTURES Recent Results (from the past 240 hour(s))  Blood culture (routine x 2)     Status: None (Preliminary result)   Collection Time: 05/30/15  5:38 PM  Result Value Ref Range Status   Specimen Description BLOOD LEFT ARM  Final   Special Requests BOTTLES DRAWN AEROBIC AND ANAEROBIC [place]  Final   Culture NO GROWTH 4 DAYS  Final   Report Status PENDING  Incomplete  Blood culture (routine x 2)     Status: None (Preliminary result)   Collection Time: 05/30/15  5:42 PM  Result Value Ref Range Status   Specimen Description BLOOD LEFT HAND  Final   Special Requests BOTTLES DRAWN AEROBIC AND ANAEROBIC 6CC EACH  Final   Culture NO GROWTH 4 DAYS  Final   Report Status PENDING  Incomplete  MRSA PCR Screening     Status:  None   Collection Time: 05/30/15 11:40 PM  Result Value Ref Range Status   MRSA by PCR NEGATIVE NEGATIVE Final    Comment:        The GeneXpert MRSA Assay (FDA approved for NASAL specimens only), is one component of a comprehensive MRSA colonization surveillance program. It is not intended to diagnose MRSA infection nor to guide or monitor treatment for MRSA infections.    Studies/Results: No results found.  Medications:  Prior to Admission:  Prescriptions prior to admission  Medication Sig Dispense Refill Last Dose  . amLODipine (NORVASC) 5 MG tablet Take 1 tablet (5 mg total) by mouth daily. 90 tablet 3 05/30/2015 at Unknown time  . clopidogrel (PLAVIX) 75 MG tablet Take 75 mg by mouth daily.   05/30/2015 at Unknown time  . Cyanocobalamin (VITAMIN B-12 IJ) Inject as directed every 30 (thirty) days.   Past Month at Unknown time  . escitalopram (LEXAPRO) 10 MG tablet Take 10 mg by mouth daily.   05/30/2015 at Unknown time  . esomeprazole (NEXIUM) 40 MG capsule Take 40 mg by mouth daily before breakfast.   05/30/2015 at Unknown time  . folic acid (FOLVITE) 1 MG tablet Take 1 mg by mouth daily.   05/30/2015 at Unknown time  . glimepiride (AMARYL) 4 MG tablet Take 4 mg by mouth 2 (two) times daily.    05/30/2015 at Unknown time  . isosorbide mononitrate (IMDUR) 30 MG 24 hr tablet Take 30 mg by mouth daily.   05/30/2015 at Unknown time  . metFORMIN (GLUCOPHAGE) 1000 MG tablet Take 1,000 mg by mouth 2 (two) times daily with a meal.    05/30/2015 at Unknown time  . metoprolol (LOPRESSOR) 50 MG tablet Take 25 mg by mouth 2 (two) times daily.   05/30/2015 at 900a  . simvastatin (ZOCOR) 20 MG tablet Take 20 mg by mouth daily.    05/30/2015 at Unknown time  . topiramate (TOPAMAX) 25 MG tablet Take 25 mg by mouth 2 (two) times daily.   05/30/2015 at Unknown time  . valsartan (DIOVAN) 160 MG tablet Take 160 mg by mouth daily.   05/30/2015 at Unknown time  . zolpidem (AMBIEN) 5 MG tablet Take 5 mg by  mouth at bedtime as needed for sleep.   unknown   Scheduled: . amLODipine  5 mg Oral Daily  . azithromycin  500 mg Intravenous Q24H  . cefTRIAXone (ROCEPHIN)  IV  1 g Intravenous Q24H  . clopidogrel  75 mg Oral Daily  . enoxaparin (LOVENOX) injection  40 mg Subcutaneous Q24H  . escitalopram  10 mg Oral Daily  . feeding supplement (ENSURE ENLIVE)  237 mL Oral BID BM  . folic acid  1 mg Oral Daily  . guaiFENesin  1,200 mg Oral BID  . insulin aspart  0-9 Units Subcutaneous 6 times per day  . ipratropium-albuterol  3 mL Nebulization Q4H  . isosorbide mononitrate  30 mg Oral Daily  . methylPREDNISolone (SOLU-MEDROL) injection  60 mg Intravenous Q12H  .  metoprolol  50 mg Oral BID  . simvastatin  20 mg Oral Daily  . topiramate  25 mg Oral BID   Continuous:  GBK:ORJGYLUDAPTCK, hydrALAZINE, ipratropium, levalbuterol  Assesment: She was admitted with acute respiratory failure with hypoxia. She has community-acquired pneumonia obstructive sleep apnea. She had been diagnosed with pulmonary hypertension sometime ago but does not appear to have that. She did have metabolic encephalopathy which has improved. I think some of her problem is that she has obstructive sleep apnea and does not use her CPap regularly. Principal Problem:   Acute respiratory failure with hypoxia (HCC) Active Problems:   Morbid obesity ([place])   Obstructive sleep apnea   CAD (coronary artery disease)   CAP (community acquired pneumonia)   Diabetes mellitus without complication ([place])   Syncope   Metabolic encephalopathy    Plan: Continue current treatments.    LOS: 4 days   Millisa Giarrusso L 06/03/2015, 9:17 AM

## 2015-06-03 NOTE — Progress Notes (Signed)
[facility] PROGRESS NOTE  [name] MantleShirley S Atha ZOX:[id number] DOB: 1935/03/16 DOA: 05/30/2015 PCP: Cassell SmilesFUSCO,[name], MD  Assessment/Plan: 1. Acute respiratory failure with hypoxia, improving with supplemental O2. Pt is stable on low flow [place], and has not required BIPAP in 48 hours.  Will continue to try to wean off O2 as tolerated. Pulmonary input appreciated. Given a trial of low-dose steroids.  2. CAP. Currently afebrile. WBC now WNL. Blood and sputum cultures pending, no growth to date. Lactic acid remains WNL following IVF. Influenza negative. CXR and CT chest unremarkable.  Currently on rocephin and azithromycin.  3. Metabolic encephalopathy, likely due to infection/hypoxia. Resolved with abx, IVF, and supplemental O2. No focal neurological deficits.  4. Morbid obesity. 5. OSA, noncompliant with CPAP at night.  6. CAD, EKG unremarkable. Serial troponins negative. Patient denied any chest pain. 7. DM type 2, stable. Will continue SSI. 8. Syncope, likely due to hypoxia.  9. Essential HTN uncontrolled.  Patient is also mildly tachycardic. BB increased and will continue Imdur/Norvasc.    Code Status: Full DVT prophylaxis: Lovenox  Family Communication: Discussed care plan with patient who understands and has no concerns at this time. Disposition Plan: Transfer to telemetry   Consultants:  Pulmology   Continue current treatments   PT  No restrictions but recommended HH PT   Procedures:  ECHO Study Conclusions  - Left ventricle: The cavity size was normal. Wall thickness was increased in a pattern of moderate LVH. Systolic function was normal. The estimated ejection fraction was in the range of 55% to 60%. Diastolic function is abnormal, indeterminate grade. Wall motion was normal; there were no regional wall motion abnormalities. - Aortic valve: Mildly calcified annulus. Trileaflet; mildly thickened leaflets. Valve area (VTI): 1.54 cm^2. Valve area (Vmax):  1.54 cm^2. - Mitral valve: Mildly calcified annulus. Mildly thickened leaflets . There was mild regurgitation. - Left atrium: The atrium was moderately dilated. - Technically adequate study.  Antibiotics:  Rocephin 11/1>> 11/6  Azithromycin 11/2>> 11/6  HPI/Subjective: Pt reports she doesn't want to be moved to another floor. Breathing is doing better. Still wheezing and mild cough.   Objective: Filed Vitals:   06/03/15 0700  BP: 135/53  Pulse: 92  Temp:   Resp: 16    Intake/Output Summary (Last 24 hours) at 06/03/15 0743 Last data filed at 06/03/15 0447  Gross per 24 hour  Intake   1500 ml  Output   1250 ml  Net    250 ml   Filed Weights   06/01/15 0500 06/02/15 0500 06/03/15 0400  Weight: 99.4 kg (219 lb 2.2 oz) 98.2 kg (216 lb 7.9 oz) 97.2 kg (214 lb 4.6 oz)    Exam:   General: Appears calm and comfortable, lying in bed  Cardiovascular: mildly tachycardic, regular rhythm , no m/r/g. No LE edema.  Respiratory: CTAB, no w/r/r.   Abdomen: soft, ntnd  Musculoskeletal: grossly normal tone BUE/BLE  Data Reviewed: Basic Metabolic Panel:  Recent Labs Lab 05/30/15 1731 05/31/15 0349 06/01/15 0525 06/02/15 0444 06/03/15 0502  NA 139 140 138 139 137  K 4.6 4.0 3.7 3.4* 3.6  CL 105 111 110 111 108  CO2 22 23 22 23  21*  GLUCOSE 271* 107* 89 105* 264*  BUN 19 19 15 15 20   CREATININE 1.32* 1.14* 1.00 1.03* 0.94  CALCIUM 9.6 8.6* 8.8* 8.6* 8.9   Liver Function Tests:  Recent Labs Lab 05/30/15 1731  AST 23  ALT 12*  ALKPHOS 86  BILITOT 1.1  PROT  7.0  ALBUMIN 3.8   CBC:  Recent Labs Lab 05/30/15 1731 05/31/15 0349 06/01/15 0525 06/02/15 0444 06/03/15 0502  WBC 27.3* 17.3* 9.9 8.6 8.3  NEUTROABS 25.4* 15.8*  --   --   --   HGB 12.3 10.9* 10.7* 10.0* 10.7*  HCT 36.2 32.2* 32.2* 30.0* 31.3*  MCV 92.3 92.3 92.5 92.3 91.8  PLT 364 271 278 289 308   Cardiac Enzymes:  Recent Labs Lab 05/30/15 2322 05/31/15 0349 05/31/15 1105   TROPONINI <0.03 0.03 0.03   BNP (last 3 results)  Recent Labs  05/30/15 1731  BNP 289.0*    CBG:  Recent Labs Lab 06/02/15 1602 06/02/15 2008 06/03/15 0003 06/03/15 0415 06/03/15 0736  GLUCAP 258* 350* 236* 233* 222*    Recent Results (from the past 240 hour(s))  Blood culture (routine x 2)     Status: None (Preliminary result)   Collection Time: 05/30/15  5:38 PM  Result Value Ref Range Status   Specimen Description BLOOD LEFT ARM  Final   Special Requests BOTTLES DRAWN AEROBIC AND ANAEROBIC 6CC EACH  Final   Culture NO GROWTH 3 DAYS  Final   Report Status PENDING  Incomplete  Blood culture (routine x 2)     Status: None (Preliminary result)   Collection Time: 05/30/15  5:42 PM  Result Value Ref Range Status   Specimen Description BLOOD LEFT HAND  Final   Special Requests BOTTLES DRAWN AEROBIC AND ANAEROBIC 6CC EACH  Final   Culture NO GROWTH 3 DAYS  Final   Report Status PENDING  Incomplete  MRSA PCR Screening     Status: None   Collection Time: 05/30/15 11:40 PM  Result Value Ref Range Status   MRSA by PCR NEGATIVE NEGATIVE Final    Comment:        The GeneXpert MRSA Assay (FDA approved for NASAL specimens only), is one component of a comprehensive MRSA colonization surveillance program. It is not intended to diagnose MRSA infection nor to guide or monitor treatment for MRSA infections.      Studies: No results found.  Scheduled Meds: . amLODipine  5 mg Oral Daily  . azithromycin  500 mg Intravenous Q24H  . cefTRIAXone (ROCEPHIN)  IV  1 g Intravenous Q24H  . clopidogrel  75 mg Oral Daily  . enoxaparin (LOVENOX) injection  40 mg Subcutaneous Q24H  . escitalopram  10 mg Oral Daily  . feeding supplement (ENSURE ENLIVE)  237 mL Oral BID BM  . folic acid  1 mg Oral Daily  . guaiFENesin  1,200 mg Oral BID  . insulin aspart  0-9 Units Subcutaneous 6 times per day  . ipratropium-albuterol  3 mL Nebulization Q4H  . isosorbide mononitrate  30 mg Oral  Daily  . methylPREDNISolone (SOLU-MEDROL) injection  60 mg Intravenous Q12H  . metoprolol  50 mg Oral BID  . simvastatin  20 mg Oral Daily  . topiramate  25 mg Oral BID   Continuous Infusions:    Principal Problem:   Acute respiratory failure with hypoxia (HCC) Active Problems:   Morbid obesity (HCC)   Obstructive sleep apnea   CAD (coronary artery disease)   CAP (community acquired pneumonia)   Diabetes mellitus without complication (HCC)   Syncope   Metabolic encephalopathy    Time spent: 20 minutes   [employer]. MD [facility] Pager [phone]. If 7PM-7AM, please contact night-coverage at [email or web address], password [facility] 06/03/2015, 7:43 AM  LOS: 4 days     By signing  my name below, I, Burnett Harry, attest that this documentation has been prepared under the direction and in the presence of [facility]. MD Electronically Signed: Burnett Harry, Scribe. 06/03/2015 9:00 AM   I, Dr. Erick Blinks, personally performed the services described in this documentaiton. All medical record entries made by the scribe were at my direction and in my presence. I have reviewed the chart and agree that the record reflects my personal performance and is accurate and complete  Erick Blinks, MD, 06/03/2015 10:25 AM

## 2015-06-04 LAB — BASIC METABOLIC PANEL
Anion gap: 6 (ref 5–15)
BUN: 31 mg/dL — AB (ref 6–20)
CALCIUM: 9 mg/dL (ref 8.9–10.3)
CO2: 25 mmol/L (ref 22–32)
CREATININE: 0.94 mg/dL (ref 0.44–1.00)
Chloride: 109 mmol/L (ref 101–111)
GFR calc non Af Amer: 57 mL/min — ABNORMAL LOW (ref >60)
Glucose, Bld: 282 mg/dL — ABNORMAL HIGH (ref 65–99)
Potassium: 4.1 mmol/L (ref 3.5–5.1)
SODIUM: 140 mmol/L (ref 135–145)

## 2015-06-04 LAB — CULTURE, BLOOD (ROUTINE X 2)
CULTURE: NO GROWTH
CULTURE: NO GROWTH

## 2015-06-04 LAB — GLUCOSE, CAPILLARY
GLUCOSE-CAPILLARY: 276 mg/dL — AB (ref 65–99)
GLUCOSE-CAPILLARY: 296 mg/dL — AB (ref 65–99)
Glucose-Capillary: 284 mg/dL — ABNORMAL HIGH (ref 65–99)
Glucose-Capillary: 295 mg/dL — ABNORMAL HIGH (ref 65–99)

## 2015-06-04 LAB — CBC
HCT: 30.9 % — ABNORMAL LOW (ref 36.0–46.0)
Hemoglobin: 10.5 g/dL — ABNORMAL LOW (ref 12.0–15.0)
MCH: 31.2 pg (ref 26.0–34.0)
MCHC: 34 g/dL (ref 30.0–36.0)
MCV: 91.7 fL (ref 78.0–100.0)
Platelets: 368 10*3/uL (ref 150–400)
RBC: 3.37 MIL/uL — AB (ref 3.87–5.11)
RDW: 11.8 % (ref 11.5–15.5)
WBC: 14.7 10*3/uL — AB (ref 4.0–10.5)

## 2015-06-04 MED ORDER — METOPROLOL TARTRATE 50 MG PO TABS: 50.0000 mg | ORAL_TABLET | Freq: Two times a day (BID) | ORAL | Status: AC

## 2015-06-04 MED ORDER — IPRATROPIUM BROMIDE 0.02 % IN SOLN: 0.5000 mg | RESPIRATORY_TRACT | Status: AC | PRN

## 2015-06-04 NOTE — Progress Notes (Signed)
CM contacted [facility]. Confirmed they serve the area.  CM faxed order, facesheet, and D/C Summary to the agency.  Confirmed with Marchelle [facility]Amanda at [facility] received for processing services.  Contacted nurse on unit regarding status.

## 2015-06-04 NOTE — Progress Notes (Signed)
Per nightshift Respiratory Therapist patient declined BiPAP use last night. Patient did wear 1.5L nasal cannula.

## 2015-06-04 NOTE — Progress Notes (Signed)
She is being discharged home today and I will of course sign off at this point. I emphasized to her that she needs to take her C Pap and use it regularly because clearly part of the problem was her hypoventilation. She will need chest x-ray in about a month to document complete resolution of the infiltrates. I also informed she and her husband of that.

## 2015-06-04 NOTE — Progress Notes (Signed)
Patient's 02 saturation at without oxygen is 92% and with ambulation without oxygen her 02 saturation is 95%.[facility] was called and informed of patient's discharge and order for home health RN and PT. [facility] states that they will have to do an intake on her, orders faxed.

## 2015-06-04 NOTE — Discharge Summary (Addendum)
Physician Discharge Summary  [name] MantleShirley S Mueller MWN:[id number] DOB: 1934/12/05 DOA: 05/30/2015  PCP: Cassell SmilesFUSCO,[name], MD  Admit date: 05/30/2015 Discharge date: 06/04/2015  Time spent: 30 minutes  Recommendations for Outpatient Follow-up:  1. Follow up with PCP in 1-2 weeks.  2. Follow up compliance with QHS CPAP.  3. Consider outpatient pulmonary function test.  Discharge Diagnoses:  Principal Problem:   Acute respiratory failure with hypoxia (HCC) Active Problems:   Morbid obesity (HCC)   Obstructive sleep apnea   CAD (coronary artery disease)   CAP (community acquired pneumonia)   Diabetes mellitus without complication (HCC)   Syncope   Metabolic encephalopathy   Sepsis due to pneumonia, present on admission  Discharge Condition: Improved  Diet recommendation: Heart-healthy  Filed Weights   06/01/15 0500 06/02/15 0500 06/03/15 0400  Weight: 99.4 kg (219 lb 2.2 oz) 98.2 kg (216 lb 7.9 oz) 97.2 kg (214 lb 4.6 oz)    History of present illness:  80 yo female with a hx of OSA, HTN, CAD, and DM presented with several days of SOB, a cough, and chills. In the PCP office her oxygen sats were mid 70s and she was tachypneic. She passed out briefly and was sent to ED. In ED on arrival her oxygen was still low 80% on 3 liters. She has no need for supplemental home oxygen chronically. She was placed on BIPAP in the ED and admitted for further management.    Hospital Course:  Acute respiratory failure with hypoxia, improved with supplemental O2 and low-dose steroids. Pt initially required BIPAP on admission however is now stable and ambulatory on RA with normal VS. She was encouraged to wear her CPAP at night. Given mild residual  wheeze, will discharge on bronchodilators.  Sepsis due to CAP was treated with IV abx. She is currently afebrile with a normal WBC and lactic acid. Blood and sputum cultures reveal no growth to date however final results are still pending.  She has completed a  course of abx in the hospital. Clinically she appears improved and is no longer SOB.   1. Metabolic encephalopathy, likely due to infection/hypoxia. Resolved with abx, IVF, and supplemental O2. No focal neurological deficits. 2. Morbid obesity. 3. OSA, noncompliant with CPAP at night.  4. CAD, EKG unremarkable. Serial troponins negative. Patient denied any chest pain. 5. DM type 2, stable. Continue home regimen.  6. Syncope, likely due to hypoxia. No further episodes. 7. Essential HTN uncontrolled. Metoprolol was increased to 50mg  BID for better BP control.    Procedures:  ECHO Study Conclusions  - Left ventricle: The cavity size was normal. Wall thickness was increased in a pattern of moderate LVH. Systolic function was normal. The estimated ejection fraction was in the range of 55% to 60%. Diastolic function is abnormal, indeterminate grade. Wall motion was normal; there were no regional wall motion abnormalities. - Aortic valve: Mildly calcified annulus. Trileaflet; mildly thickened leaflets. Valve area (VTI): 1.54 cm^2. Valve area (Vmax): 1.54 cm^2. - Mitral valve: Mildly calcified annulus. Mildly thickened leaflets . There was mild regurgitation. - Left atrium: The atrium was moderately dilated. - Technically adequate study.  Consultations:  Pulmonology  PT- HH  Discharge Exam: Filed Vitals:   06/04/15 0600  BP: 157/80  Pulse: 89  Temp: 97.7 F (36.5 C)  Resp: 18     General: NAD, looks comfortable  Cardiovascular: RRR, S1, S2   Respiratory: Mild expiratory wheezes. No rales or rhonchi  Abdomen: soft, non tender, no distention , bowel  sounds normal  Musculoskeletal: No edema b/l   Discharge Instructions   Discharge Instructions    Diet - low sodium heart healthy    Complete by:  As directed      Diet Carb Modified    Complete by:  As directed      Face-to-face encounter (required for Medicare/Medicaid patients)    Complete by:  As  directed   I MEMON,JEHANZEB certify that this patient is under my care and that I, or a nurse practitioner or physician's assistant working with me, had a face-to-face encounter that meets the physician face-to-face encounter requirements with this patient on 06/04/2015. The encounter with the patient was in whole, or in part for the following medical condition(s) which is the primary reason for home health care (List medical condition):pneumonia, deconditioning  The encounter with the patient was in whole, or in part, for the following medical condition, which is the primary reason for home health care:  pneumonia  I certify that, based on my findings, the following services are medically necessary home health services:   Nursing Physical therapy    Reason for Medically Necessary Home Health Services:  Skilled Nursing- Skilled Assessment/Observation  My clinical findings support the need for the above services:  Shortness of breath with activity  Further, I certify that my clinical findings support that this patient is homebound due to:  Shortness of Breath with activity     Home Health    Complete by:  As directed   To provide the following care/treatments:   PT RN       Increase activity slowly    Complete by:  As directed           Current Discharge Medication List    START taking these medications   Details  ipratropium (ATROVENT) 0.02 % nebulizer solution Take 2.5 mLs (0.5 mg total) by nebulization every 4 (four) hours as needed for wheezing or shortness of breath. Qty: 75 mL, Refills: 12      CONTINUE these medications which have CHANGED   Details  metoprolol (LOPRESSOR) 50 MG tablet Take 1 tablet (50 mg total) by mouth 2 (two) times daily. Qty: 60 tablet, Refills: 0      CONTINUE these medications which have NOT CHANGED   Details  amLODipine (NORVASC) 5 MG tablet Take 1 tablet (5 mg total) by mouth daily. Qty: 90 tablet, Refills: 3    clopidogrel (PLAVIX) 75 MG tablet  Take 75 mg by mouth daily.    Cyanocobalamin (VITAMIN B-12 IJ) Inject as directed every 30 (thirty) days.    escitalopram (LEXAPRO) 10 MG tablet Take 10 mg by mouth daily.    esomeprazole (NEXIUM) 40 MG capsule Take 40 mg by mouth daily before breakfast.    folic acid (FOLVITE) 1 MG tablet Take 1 mg by mouth daily.    glimepiride (AMARYL) 4 MG tablet Take 4 mg by mouth 2 (two) times daily.     isosorbide mononitrate (IMDUR) 30 MG 24 hr tablet Take 30 mg by mouth daily.    metFORMIN (GLUCOPHAGE) 1000 MG tablet Take 1,000 mg by mouth 2 (two) times daily with a meal.     simvastatin (ZOCOR) 20 MG tablet Take 20 mg by mouth daily.     topiramate (TOPAMAX) 25 MG tablet Take 25 mg by mouth 2 (two) times daily.    valsartan (DIOVAN) 160 MG tablet Take 160 mg by mouth daily.    zolpidem (AMBIEN) 5 MG tablet Take 5 mg by  mouth at bedtime as needed for sleep.       Allergies  Allergen Reactions  . Ambien [Zolpidem] Other (See Comments)    confusion  . Ezetimibe   . Lisinopril Cough      The results of significant diagnostics from this hospitalization (including imaging, microbiology, ancillary and laboratory) are listed below for reference.    Significant Diagnostic Studies: Dg Chest 2 View  05/30/2015  CLINICAL DATA:  Shortness of Breath EXAM: CHEST  2 VIEW COMPARISON:  10/29/2014 FINDINGS: Cardiomediastinal silhouette is stable. No acute infiltrate or pleural effusion. No pulmonary edema. Mild degenerative changes thoracic spine. IMPRESSION: No active cardiopulmonary disease. Electronically Signed   By: Natasha Mead M.D.   On: 05/30/2015 18:30   Ct Angio Chest Pe W/cm &/or Wo Cm  05/30/2015  CLINICAL DATA:  Hypoxia. EXAM: CT ANGIOGRAPHY CHEST WITH CONTRAST TECHNIQUE: Multidetector CT imaging of the chest was performed using the standard protocol during bolus administration of intravenous contrast. Multiplanar CT image reconstructions and MIPs were obtained to evaluate the vascular  anatomy. CONTRAST:  75mL OMNIPAQUE IOHEXOL 350 MG/ML SOLN COMPARISON:  09/11/2007 FINDINGS: Cardiovascular: There is good opacification of the pulmonary arteries. There is no pulmonary embolism. The thoracic aorta is normal in caliber and intact. Lungs: There is focal ground-glass opacity in the lateral inferior periphery of the lingula. This may represent early infectious infiltrate, but noninfectious inflammatory change or early atelectasis are also considerations. Lungs are otherwise clear. Central airways: Patent Effusions: None Lymphadenopathy: None Esophagus: Unremarkable Upper abdomen: No significant abnormality Musculoskeletal: Bridging calcifications at the anterior aspect of the vertebral column in the mid to lower thoracic spine, consistent with diffuse idiopathic skeletal hyperostosis. No significant skeletal lesions. Review of the MIP images confirms the above findings. IMPRESSION: 1. Negative for acute pulmonary embolism. 2. Patchy ground-glass opacity in the lateral lingula. This could represent early infectious infiltrate or atelectasis. Electronically Signed   By: Ellery Plunk M.D.   On: 05/30/2015 21:44    Microbiology: Recent Results (from the past 240 hour(s))  Blood culture (routine x 2)     Status: None (Preliminary result)   Collection Time: 05/30/15  5:38 PM  Result Value Ref Range Status   Specimen Description BLOOD LEFT ARM  Final   Special Requests BOTTLES DRAWN AEROBIC AND ANAEROBIC 6CC EACH  Final   Culture NO GROWTH 4 DAYS  Final   Report Status PENDING  Incomplete  Blood culture (routine x 2)     Status: None (Preliminary result)   Collection Time: 05/30/15  5:42 PM  Result Value Ref Range Status   Specimen Description BLOOD LEFT HAND  Final   Special Requests BOTTLES DRAWN AEROBIC AND ANAEROBIC 6CC EACH  Final   Culture NO GROWTH 4 DAYS  Final   Report Status PENDING  Incomplete  MRSA PCR Screening     Status: None   Collection Time: 05/30/15 11:40 PM  Result  Value Ref Range Status   MRSA by PCR NEGATIVE NEGATIVE Final    Comment:        The GeneXpert MRSA Assay (FDA approved for NASAL specimens only), is one component of a comprehensive MRSA colonization surveillance program. It is not intended to diagnose MRSA infection nor to guide or monitor treatment for MRSA infections.      Labs: Basic Metabolic Panel:  Recent Labs Lab 05/31/15 0349 06/01/15 0525 06/02/15 0444 06/03/15 0502 06/04/15 0627  NA 140 138 139 137 140  K 4.0 3.7 3.4* 3.6 4.1  CL  111 110 111 108 109  CO2 23 22 23  21* 25  GLUCOSE 107* 89 105* 264* 282*  BUN 19 15 15 20  31*  CREATININE 1.14* 1.00 1.03* 0.94 0.94  CALCIUM 8.6* 8.8* 8.6* 8.9 9.0   Liver Function Tests:  Recent Labs Lab 05/30/15 1731  AST 23  ALT 12*  ALKPHOS 86  BILITOT 1.1  PROT 7.0  ALBUMIN 3.8    CBC:  Recent Labs Lab 05/30/15 1731 05/31/15 0349 06/01/15 0525 06/02/15 0444 06/03/15 0502 06/04/15 0627  WBC 27.3* 17.3* 9.9 8.6 8.3 14.7*  NEUTROABS 25.4* 15.8*  --   --   --   --   HGB 12.3 10.9* 10.7* 10.0* 10.7* 10.5*  HCT 36.2 32.2* 32.2* 30.0* 31.3* 30.9*  MCV 92.3 92.3 92.5 92.3 91.8 91.7  PLT 364 271 278 289 308 368   Cardiac Enzymes:  Recent Labs Lab 05/30/15 2322 05/31/15 0349 05/31/15 1105  TROPONINI <0.03 0.03 0.03   BNP: BNP (last 3 results)  Recent Labs  05/30/15 1731  BNP 289.0*     CBG:  Recent Labs Lab 06/03/15 1648 06/03/15 2003 06/04/15 0022 06/04/15 0405 06/04/15 0831  GLUCAP 408* 307* 284* 276* 296*     Signed:  Jehanzeb Memon. MD  [facility] 06/04/2015, 10:40 AM  By signing my name below, I, Burnett Harry, attest that this documentation has been prepared under the direction and in the presence of [facility]. MD Electronically Signed: Burnett Harry, Scribe. 06/04/2015  I, Dr. Erick Blinks, personally performed the services described in this documentaiton. All medical record entries made by the scribe  were at my direction and in my presence. I have reviewed the chart and agree that the record reflects my personal performance and is accurate and complete  Erick Blinks, MD, 06/04/2015 10:41 AM

## 2015-06-05 DIAGNOSIS — G4733 Obstructive sleep apnea (adult) (pediatric): Secondary | ICD-10-CM | POA: Diagnosis not present

## 2015-06-05 DIAGNOSIS — E669 Obesity, unspecified: Secondary | ICD-10-CM | POA: Diagnosis not present

## 2015-06-05 DIAGNOSIS — I2510 Atherosclerotic heart disease of native coronary artery without angina pectoris: Secondary | ICD-10-CM | POA: Diagnosis not present

## 2015-06-05 DIAGNOSIS — Z7984 Long term (current) use of oral hypoglycemic drugs: Secondary | ICD-10-CM | POA: Diagnosis not present

## 2015-06-05 DIAGNOSIS — R55 Syncope and collapse: Secondary | ICD-10-CM | POA: Diagnosis not present

## 2015-06-05 DIAGNOSIS — Z8701 Personal history of pneumonia (recurrent): Secondary | ICD-10-CM | POA: Diagnosis not present

## 2015-06-05 DIAGNOSIS — E119 Type 2 diabetes mellitus without complications: Secondary | ICD-10-CM | POA: Diagnosis not present

## 2015-06-05 DIAGNOSIS — R269 Unspecified abnormalities of gait and mobility: Secondary | ICD-10-CM | POA: Diagnosis not present

## 2015-06-05 DIAGNOSIS — I10 Essential (primary) hypertension: Secondary | ICD-10-CM | POA: Diagnosis not present

## 2015-06-05 DIAGNOSIS — Z7902 Long term (current) use of antithrombotics/antiplatelets: Secondary | ICD-10-CM | POA: Diagnosis not present

## 2015-06-06 DIAGNOSIS — E119 Type 2 diabetes mellitus without complications: Secondary | ICD-10-CM | POA: Diagnosis not present

## 2015-06-06 DIAGNOSIS — I10 Essential (primary) hypertension: Secondary | ICD-10-CM | POA: Diagnosis not present

## 2015-06-06 DIAGNOSIS — I2510 Atherosclerotic heart disease of native coronary artery without angina pectoris: Secondary | ICD-10-CM | POA: Diagnosis not present

## 2015-06-06 DIAGNOSIS — R269 Unspecified abnormalities of gait and mobility: Secondary | ICD-10-CM | POA: Diagnosis not present

## 2015-06-06 DIAGNOSIS — R55 Syncope and collapse: Secondary | ICD-10-CM | POA: Diagnosis not present

## 2015-06-06 DIAGNOSIS — G4733 Obstructive sleep apnea (adult) (pediatric): Secondary | ICD-10-CM | POA: Diagnosis not present

## 2015-06-07 DIAGNOSIS — I2510 Atherosclerotic heart disease of native coronary artery without angina pectoris: Secondary | ICD-10-CM | POA: Diagnosis not present

## 2015-06-07 DIAGNOSIS — I10 Essential (primary) hypertension: Secondary | ICD-10-CM | POA: Diagnosis not present

## 2015-06-07 DIAGNOSIS — R269 Unspecified abnormalities of gait and mobility: Secondary | ICD-10-CM | POA: Diagnosis not present

## 2015-06-07 DIAGNOSIS — E119 Type 2 diabetes mellitus without complications: Secondary | ICD-10-CM | POA: Diagnosis not present

## 2015-06-07 DIAGNOSIS — G4733 Obstructive sleep apnea (adult) (pediatric): Secondary | ICD-10-CM | POA: Diagnosis not present

## 2015-06-07 DIAGNOSIS — R55 Syncope and collapse: Secondary | ICD-10-CM | POA: Diagnosis not present

## 2015-06-08 DIAGNOSIS — R269 Unspecified abnormalities of gait and mobility: Secondary | ICD-10-CM | POA: Diagnosis not present

## 2015-06-08 DIAGNOSIS — G4733 Obstructive sleep apnea (adult) (pediatric): Secondary | ICD-10-CM | POA: Diagnosis not present

## 2015-06-08 DIAGNOSIS — I10 Essential (primary) hypertension: Secondary | ICD-10-CM | POA: Diagnosis not present

## 2015-06-08 DIAGNOSIS — R55 Syncope and collapse: Secondary | ICD-10-CM | POA: Diagnosis not present

## 2015-06-08 DIAGNOSIS — E119 Type 2 diabetes mellitus without complications: Secondary | ICD-10-CM | POA: Diagnosis not present

## 2015-06-08 DIAGNOSIS — I2510 Atherosclerotic heart disease of native coronary artery without angina pectoris: Secondary | ICD-10-CM | POA: Diagnosis not present

## 2015-06-09 ENCOUNTER — Emergency Department (HOSPITAL_COMMUNITY)
Admission: EM | Admit: 2015-06-09 | Discharge: 2015-06-09 | Disposition: A | Discharge status: Home / Self Care | Payer: Medicare Other | Attending: Emergency Medicine | Admitting: Emergency Medicine

## 2015-06-09 ENCOUNTER — Encounter (HOSPITAL_COMMUNITY): Payer: Self-pay | Admitting: Emergency Medicine

## 2015-06-09 DIAGNOSIS — R040 Epistaxis: Secondary | ICD-10-CM | POA: Insufficient documentation

## 2015-06-09 DIAGNOSIS — Z862 Personal history of diseases of the blood and blood-forming organs and certain disorders involving the immune mechanism: Secondary | ICD-10-CM | POA: Diagnosis not present

## 2015-06-09 DIAGNOSIS — Z8669 Personal history of other diseases of the nervous system and sense organs: Secondary | ICD-10-CM | POA: Insufficient documentation

## 2015-06-09 DIAGNOSIS — I10 Essential (primary) hypertension: Secondary | ICD-10-CM | POA: Diagnosis not present

## 2015-06-09 DIAGNOSIS — Z7902 Long term (current) use of antithrombotics/antiplatelets: Secondary | ICD-10-CM | POA: Diagnosis not present

## 2015-06-09 DIAGNOSIS — E119 Type 2 diabetes mellitus without complications: Secondary | ICD-10-CM | POA: Diagnosis not present

## 2015-06-09 DIAGNOSIS — Z79899 Other long term (current) drug therapy: Secondary | ICD-10-CM | POA: Diagnosis not present

## 2015-06-09 DIAGNOSIS — E785 Hyperlipidemia, unspecified: Secondary | ICD-10-CM | POA: Insufficient documentation

## 2015-06-09 DIAGNOSIS — I2510 Atherosclerotic heart disease of native coronary artery without angina pectoris: Secondary | ICD-10-CM | POA: Insufficient documentation

## 2015-06-09 DIAGNOSIS — R531 Weakness: Secondary | ICD-10-CM | POA: Diagnosis not present

## 2015-06-09 DIAGNOSIS — Z9861 Coronary angioplasty status: Secondary | ICD-10-CM | POA: Insufficient documentation

## 2015-06-09 MED ORDER — OXYMETAZOLINE HCL 0.05 % NA SOLN
1.0000 | Freq: Once | NASAL | Status: AC
  Administered 2015-06-09: 1 via NASAL
  Filled 2015-06-09: qty 15

## 2015-06-09 NOTE — Progress Notes (Signed)
CM called to ED to arrange neb machine for pt. [facility] and [facility]. Alroy BailiffLinda Lothian [facility] aware and will collect the orders from the chart. Neb machine [facility]. Pt and pts nurse aware of discharge arrangements.

## 2015-06-09 NOTE — ED Provider Notes (Signed)
CSN: [id number]     Arrival date & time 06/09/15  1232 History   First MD Initiated Contact with Patient 06/09/15 1253     Chief Complaint  Patient presents with  . Epistaxis     (Consider location/radiation/quality/duration/timing/severity/associated sxs/prior Treatment) Patient is a 80 y.o. female presenting with nosebleeds.  Epistaxis Location:  L nare Severity:  Mild Duration:  2 hours Timing:  Intermittent Chronicity:  New Context: CPAP   Relieved by:  None tried Worsened by:  Nothing tried Ineffective treatments:  None tried Associated symptoms: blood in oropharynx   Associated symptoms: no congestion, no cough, no fever, no headaches and no sinus pain     Past Medical History  Diagnosis Date  . Coronary artery disease     stent to ramus intermedius in 2004  . Diabetes mellitus without complication (HCC)   . Hypertension   . Morbid obesity (HCC)   . NSVT (nonsustained ventricular tachycardia) (HCC)   . OSA (obstructive sleep apnea)     AHI during total sleep 10.27/hr and REM sleep 40.34/hr  . History of pulmonary hypertension   . Anemia   . Dyslipidemia   . History of nuclear stress test 2004    no significant ischemia   . Arterial insufficiency (HCC)     mild- 2007 LEAs   Past Surgical History  Procedure Laterality Date  . Coronary angioplasty  01/2003    3.0x59mm TAXUS DES to ramus intermedius  . Transthoracic echocardiogram  09/2011    EF=>55%, mild conc LVH, normal LV systolic function; LA mild-mod dilated; mild mitral annular calcif, mild MR; mild TR & normal RV systolic pressure; trace PVR   Family History  Problem Relation Age of Onset  . Family history unknown: Yes   Social History  Substance Use Topics  . Smoking status: Never Smoker   . Smokeless tobacco: Never Used  . Alcohol Use: No   OB History    No data available     Review of Systems  Constitutional: Negative for fever and chills.  HENT: Positive for nosebleeds. Negative for  congestion.   Eyes: Negative for pain.  Respiratory: Negative for cough.   Gastrointestinal: Negative for nausea, vomiting and diarrhea.  Endocrine: Negative for heat intolerance, polydipsia and polyuria.  Genitourinary: Negative for dysuria and enuresis.  Musculoskeletal: Negative for back pain and gait problem.  Neurological: Negative for headaches.  All other systems reviewed and are negative.     Allergies  Ambien; Ezetimibe; and Lisinopril  Home Medications   Prior to Admission medications   Medication Sig Start Date End Date Taking? Authorizing Provider  amLODipine (NORVASC) 5 MG tablet Take 1 tablet (5 mg total) by mouth daily. 05/05/13  Yes Chrystie Nose, MD  clopidogrel (PLAVIX) 75 MG tablet Take 75 mg by mouth daily. 04/12/13  Yes Historical Provider, MD  Cyanocobalamin (VITAMIN B-12 IJ) Inject as directed every 30 (thirty) days.   Yes Historical Provider, MD  escitalopram (LEXAPRO) 10 MG tablet Take 10 mg by mouth daily.   Yes Historical Provider, MD  esomeprazole (NEXIUM) 40 MG capsule Take 40 mg by mouth daily before breakfast.   Yes Historical Provider, MD  folic acid (FOLVITE) 1 MG tablet Take 1 mg by mouth daily.   Yes Historical Provider, MD  glimepiride (AMARYL) 4 MG tablet Take 4 mg by mouth 2 (two) times daily.    Yes Historical Provider, MD  ipratropium (ATROVENT) 0.02 % nebulizer solution Take 2.5 mLs (0.5 mg total) by nebulization every  4 (four) hours as needed for wheezing or shortness of breath. 06/04/15  Yes Erick BlinksJehanzeb Memon, MD  isosorbide mononitrate (IMDUR) 30 MG 24 hr tablet Take 30 mg by mouth daily.   Yes Historical Provider, MD  metFORMIN (GLUCOPHAGE) 1000 MG tablet Take 1,000 mg by mouth 2 (two) times daily with a meal.    Yes Historical Provider, MD  metoprolol (LOPRESSOR) 50 MG tablet Take 1 tablet (50 mg total) by mouth 2 (two) times daily. 06/04/15  Yes Erick BlinksJehanzeb Memon, MD  simvastatin (ZOCOR) 20 MG tablet Take 20 mg by mouth daily.    Yes Historical  Provider, MD  topiramate (TOPAMAX) 25 MG tablet Take 25 mg by mouth 2 (two) times daily.   Yes Historical Provider, MD  valsartan (DIOVAN) 160 MG tablet Take 160 mg by mouth daily.   Yes Historical Provider, MD   BP 174/72 mmHg  Pulse 87  Temp(Src) 97.8 F (36.6 C) (Oral)  Resp 16  Ht 5\' 4"  (1.626 m)  Wt 214 lb (97.07 kg)  BMI 36.72 kg/m2  SpO2 99% Physical Exam  Constitutional: She is oriented to person, place, and time. She appears well-developed and well-nourished.  HENT:  Head: Normocephalic and atraumatic.  Nose: No sinus tenderness. Epistaxis (left) is observed.  Neck: Normal range of motion.  Cardiovascular: Normal rate and regular rhythm.   Pulmonary/Chest: No stridor. No respiratory distress.  Abdominal: Soft. She exhibits no distension. There is no tenderness.  Musculoskeletal: Normal range of motion. She exhibits no edema or tenderness.  Neurological: She is alert and oriented to person, place, and time.  Skin: Skin is warm and dry.  Nursing note and vitals reviewed.   ED Course  Procedures (including critical care time) Labs Review Labs Reviewed - No data to display  Imaging Review No results found. I have personally reviewed and evaluated these images and lab results as part of my medical decision-making.   EKG Interpretation None      MDM   Final diagnoses:  Bleeding nose   Epistaxis improved, no bleeding for a couple hours after afrin spray. Likely 2/2 bipap. Informed her that she probably needed a humidifier is likely secondary to dry mucous membranes. Also home with Afrin and instructions on how to use it. Doubt significant blood loss at this time without skin color change or abnormal vs.   I have personally and contemperaneously reviewed labs and imaging and used in my decision making as above.   A medical screening exam was performed and I feel the patient has had an appropriate workup for their chief complaint at this time and likelihood of  emergent condition existing is low. They have been counseled on decision, discharge, follow up and which symptoms necessitate immediate return to the emergency department. They verbally stated understanding and agreement with plan and discharged in stable condition.      Marily MemosJason Anuj Summons, MD 06/09/15 [phone]

## 2015-06-09 NOTE — ED Notes (Signed)
PT states nose bleeding intermittently this am staring at 0800. PT states recent dx of pneumonia and new CPAP machine at home. EMS reported pt had a near syncopal episode prior to ED arrival. PT alert and oriented on arrival to ED with small amount of bleeding noted to Left nare.

## 2015-06-12 DIAGNOSIS — I10 Essential (primary) hypertension: Secondary | ICD-10-CM | POA: Diagnosis not present

## 2015-06-12 DIAGNOSIS — E119 Type 2 diabetes mellitus without complications: Secondary | ICD-10-CM | POA: Diagnosis not present

## 2015-06-12 DIAGNOSIS — G4733 Obstructive sleep apnea (adult) (pediatric): Secondary | ICD-10-CM | POA: Diagnosis not present

## 2015-06-12 DIAGNOSIS — R55 Syncope and collapse: Secondary | ICD-10-CM | POA: Diagnosis not present

## 2015-06-12 DIAGNOSIS — R269 Unspecified abnormalities of gait and mobility: Secondary | ICD-10-CM | POA: Diagnosis not present

## 2015-06-12 DIAGNOSIS — I2510 Atherosclerotic heart disease of native coronary artery without angina pectoris: Secondary | ICD-10-CM | POA: Diagnosis not present

## 2015-06-14 DIAGNOSIS — R269 Unspecified abnormalities of gait and mobility: Secondary | ICD-10-CM | POA: Diagnosis not present

## 2015-06-14 DIAGNOSIS — R55 Syncope and collapse: Secondary | ICD-10-CM | POA: Diagnosis not present

## 2015-06-14 DIAGNOSIS — Z6836 Body mass index (BMI) 36.0-36.9, adult: Secondary | ICD-10-CM | POA: Diagnosis not present

## 2015-06-14 DIAGNOSIS — I2510 Atherosclerotic heart disease of native coronary artery without angina pectoris: Secondary | ICD-10-CM | POA: Diagnosis not present

## 2015-06-14 DIAGNOSIS — G4733 Obstructive sleep apnea (adult) (pediatric): Secondary | ICD-10-CM | POA: Diagnosis not present

## 2015-06-14 DIAGNOSIS — G9341 Metabolic encephalopathy: Secondary | ICD-10-CM | POA: Diagnosis not present

## 2015-06-14 DIAGNOSIS — R0902 Hypoxemia: Secondary | ICD-10-CM | POA: Diagnosis not present

## 2015-06-14 DIAGNOSIS — I10 Essential (primary) hypertension: Secondary | ICD-10-CM | POA: Diagnosis not present

## 2015-06-14 DIAGNOSIS — E119 Type 2 diabetes mellitus without complications: Secondary | ICD-10-CM | POA: Diagnosis not present

## 2015-06-16 DIAGNOSIS — E119 Type 2 diabetes mellitus without complications: Secondary | ICD-10-CM | POA: Diagnosis not present

## 2015-06-16 DIAGNOSIS — G4733 Obstructive sleep apnea (adult) (pediatric): Secondary | ICD-10-CM | POA: Diagnosis not present

## 2015-06-16 DIAGNOSIS — R55 Syncope and collapse: Secondary | ICD-10-CM | POA: Diagnosis not present

## 2015-06-16 DIAGNOSIS — R269 Unspecified abnormalities of gait and mobility: Secondary | ICD-10-CM | POA: Diagnosis not present

## 2015-06-16 DIAGNOSIS — I10 Essential (primary) hypertension: Secondary | ICD-10-CM | POA: Diagnosis not present

## 2015-06-16 DIAGNOSIS — I2510 Atherosclerotic heart disease of native coronary artery without angina pectoris: Secondary | ICD-10-CM | POA: Diagnosis not present

## 2015-06-17 DIAGNOSIS — E119 Type 2 diabetes mellitus without complications: Secondary | ICD-10-CM | POA: Diagnosis not present

## 2015-06-17 DIAGNOSIS — I10 Essential (primary) hypertension: Secondary | ICD-10-CM | POA: Diagnosis not present

## 2015-06-17 DIAGNOSIS — G4733 Obstructive sleep apnea (adult) (pediatric): Secondary | ICD-10-CM | POA: Diagnosis not present

## 2015-06-17 DIAGNOSIS — I2510 Atherosclerotic heart disease of native coronary artery without angina pectoris: Secondary | ICD-10-CM | POA: Diagnosis not present

## 2015-06-17 DIAGNOSIS — R55 Syncope and collapse: Secondary | ICD-10-CM | POA: Diagnosis not present

## 2015-06-17 DIAGNOSIS — R269 Unspecified abnormalities of gait and mobility: Secondary | ICD-10-CM | POA: Diagnosis not present

## 2015-06-18 DIAGNOSIS — R55 Syncope and collapse: Secondary | ICD-10-CM | POA: Diagnosis not present

## 2015-06-18 DIAGNOSIS — G4733 Obstructive sleep apnea (adult) (pediatric): Secondary | ICD-10-CM | POA: Diagnosis not present

## 2015-06-18 DIAGNOSIS — R269 Unspecified abnormalities of gait and mobility: Secondary | ICD-10-CM | POA: Diagnosis not present

## 2015-06-18 DIAGNOSIS — I10 Essential (primary) hypertension: Secondary | ICD-10-CM | POA: Diagnosis not present

## 2015-06-18 DIAGNOSIS — I2510 Atherosclerotic heart disease of native coronary artery without angina pectoris: Secondary | ICD-10-CM | POA: Diagnosis not present

## 2015-06-18 DIAGNOSIS — E119 Type 2 diabetes mellitus without complications: Secondary | ICD-10-CM | POA: Diagnosis not present

## 2015-06-19 DIAGNOSIS — I10 Essential (primary) hypertension: Secondary | ICD-10-CM | POA: Diagnosis not present

## 2015-06-19 DIAGNOSIS — G4733 Obstructive sleep apnea (adult) (pediatric): Secondary | ICD-10-CM | POA: Diagnosis not present

## 2015-06-19 DIAGNOSIS — E119 Type 2 diabetes mellitus without complications: Secondary | ICD-10-CM | POA: Diagnosis not present

## 2015-06-19 DIAGNOSIS — I2510 Atherosclerotic heart disease of native coronary artery without angina pectoris: Secondary | ICD-10-CM | POA: Diagnosis not present

## 2015-06-19 DIAGNOSIS — R55 Syncope and collapse: Secondary | ICD-10-CM | POA: Diagnosis not present

## 2015-06-19 DIAGNOSIS — R269 Unspecified abnormalities of gait and mobility: Secondary | ICD-10-CM | POA: Diagnosis not present

## 2015-06-21 DIAGNOSIS — R55 Syncope and collapse: Secondary | ICD-10-CM | POA: Diagnosis not present

## 2015-06-21 DIAGNOSIS — I10 Essential (primary) hypertension: Secondary | ICD-10-CM | POA: Diagnosis not present

## 2015-06-21 DIAGNOSIS — G4733 Obstructive sleep apnea (adult) (pediatric): Secondary | ICD-10-CM | POA: Diagnosis not present

## 2015-06-21 DIAGNOSIS — I2510 Atherosclerotic heart disease of native coronary artery without angina pectoris: Secondary | ICD-10-CM | POA: Diagnosis not present

## 2015-06-21 DIAGNOSIS — R269 Unspecified abnormalities of gait and mobility: Secondary | ICD-10-CM | POA: Diagnosis not present

## 2015-06-21 DIAGNOSIS — E119 Type 2 diabetes mellitus without complications: Secondary | ICD-10-CM | POA: Diagnosis not present

## 2015-06-26 DIAGNOSIS — E119 Type 2 diabetes mellitus without complications: Secondary | ICD-10-CM | POA: Diagnosis not present

## 2015-06-26 DIAGNOSIS — G4733 Obstructive sleep apnea (adult) (pediatric): Secondary | ICD-10-CM | POA: Diagnosis not present

## 2015-06-26 DIAGNOSIS — I10 Essential (primary) hypertension: Secondary | ICD-10-CM | POA: Diagnosis not present

## 2015-06-26 DIAGNOSIS — R55 Syncope and collapse: Secondary | ICD-10-CM | POA: Diagnosis not present

## 2015-06-26 DIAGNOSIS — I2510 Atherosclerotic heart disease of native coronary artery without angina pectoris: Secondary | ICD-10-CM | POA: Diagnosis not present

## 2015-06-26 DIAGNOSIS — R269 Unspecified abnormalities of gait and mobility: Secondary | ICD-10-CM | POA: Diagnosis not present

## 2015-06-28 DIAGNOSIS — I10 Essential (primary) hypertension: Secondary | ICD-10-CM | POA: Diagnosis not present

## 2015-06-28 DIAGNOSIS — R269 Unspecified abnormalities of gait and mobility: Secondary | ICD-10-CM | POA: Diagnosis not present

## 2015-06-28 DIAGNOSIS — G4733 Obstructive sleep apnea (adult) (pediatric): Secondary | ICD-10-CM | POA: Diagnosis not present

## 2015-06-28 DIAGNOSIS — I2510 Atherosclerotic heart disease of native coronary artery without angina pectoris: Secondary | ICD-10-CM | POA: Diagnosis not present

## 2015-06-28 DIAGNOSIS — E119 Type 2 diabetes mellitus without complications: Secondary | ICD-10-CM | POA: Diagnosis not present

## 2015-06-28 DIAGNOSIS — R55 Syncope and collapse: Secondary | ICD-10-CM | POA: Diagnosis not present

## 2015-07-03 DIAGNOSIS — E119 Type 2 diabetes mellitus without complications: Secondary | ICD-10-CM | POA: Diagnosis not present

## 2015-07-03 DIAGNOSIS — G4733 Obstructive sleep apnea (adult) (pediatric): Secondary | ICD-10-CM | POA: Diagnosis not present

## 2015-07-03 DIAGNOSIS — R55 Syncope and collapse: Secondary | ICD-10-CM | POA: Diagnosis not present

## 2015-07-03 DIAGNOSIS — I10 Essential (primary) hypertension: Secondary | ICD-10-CM | POA: Diagnosis not present

## 2015-07-03 DIAGNOSIS — I2510 Atherosclerotic heart disease of native coronary artery without angina pectoris: Secondary | ICD-10-CM | POA: Diagnosis not present

## 2015-07-03 DIAGNOSIS — R269 Unspecified abnormalities of gait and mobility: Secondary | ICD-10-CM | POA: Diagnosis not present

## 2015-07-04 DIAGNOSIS — I2510 Atherosclerotic heart disease of native coronary artery without angina pectoris: Secondary | ICD-10-CM | POA: Diagnosis not present

## 2015-07-04 DIAGNOSIS — I10 Essential (primary) hypertension: Secondary | ICD-10-CM | POA: Diagnosis not present

## 2015-07-04 DIAGNOSIS — R269 Unspecified abnormalities of gait and mobility: Secondary | ICD-10-CM | POA: Diagnosis not present

## 2015-07-04 DIAGNOSIS — R55 Syncope and collapse: Secondary | ICD-10-CM | POA: Diagnosis not present

## 2015-07-04 DIAGNOSIS — E119 Type 2 diabetes mellitus without complications: Secondary | ICD-10-CM | POA: Diagnosis not present

## 2015-07-04 DIAGNOSIS — G4733 Obstructive sleep apnea (adult) (pediatric): Secondary | ICD-10-CM | POA: Diagnosis not present

## 2015-07-06 DIAGNOSIS — I2510 Atherosclerotic heart disease of native coronary artery without angina pectoris: Secondary | ICD-10-CM | POA: Diagnosis not present

## 2015-07-06 DIAGNOSIS — R55 Syncope and collapse: Secondary | ICD-10-CM | POA: Diagnosis not present

## 2015-07-06 DIAGNOSIS — I10 Essential (primary) hypertension: Secondary | ICD-10-CM | POA: Diagnosis not present

## 2015-07-06 DIAGNOSIS — G4733 Obstructive sleep apnea (adult) (pediatric): Secondary | ICD-10-CM | POA: Diagnosis not present

## 2015-07-06 DIAGNOSIS — R269 Unspecified abnormalities of gait and mobility: Secondary | ICD-10-CM | POA: Diagnosis not present

## 2015-07-06 DIAGNOSIS — E119 Type 2 diabetes mellitus without complications: Secondary | ICD-10-CM | POA: Diagnosis not present

## 2015-07-13 DIAGNOSIS — G4733 Obstructive sleep apnea (adult) (pediatric): Secondary | ICD-10-CM | POA: Diagnosis not present

## 2015-07-13 DIAGNOSIS — I10 Essential (primary) hypertension: Secondary | ICD-10-CM | POA: Diagnosis not present

## 2015-07-13 DIAGNOSIS — I2510 Atherosclerotic heart disease of native coronary artery without angina pectoris: Secondary | ICD-10-CM | POA: Diagnosis not present

## 2015-07-13 DIAGNOSIS — E119 Type 2 diabetes mellitus without complications: Secondary | ICD-10-CM | POA: Diagnosis not present

## 2015-07-13 DIAGNOSIS — R55 Syncope and collapse: Secondary | ICD-10-CM | POA: Diagnosis not present

## 2015-07-13 DIAGNOSIS — R269 Unspecified abnormalities of gait and mobility: Secondary | ICD-10-CM | POA: Diagnosis not present

## 2015-07-17 DIAGNOSIS — R269 Unspecified abnormalities of gait and mobility: Secondary | ICD-10-CM | POA: Diagnosis not present

## 2015-07-17 DIAGNOSIS — E119 Type 2 diabetes mellitus without complications: Secondary | ICD-10-CM | POA: Diagnosis not present

## 2015-07-17 DIAGNOSIS — G4733 Obstructive sleep apnea (adult) (pediatric): Secondary | ICD-10-CM | POA: Diagnosis not present

## 2015-07-17 DIAGNOSIS — I2510 Atherosclerotic heart disease of native coronary artery without angina pectoris: Secondary | ICD-10-CM | POA: Diagnosis not present

## 2015-07-17 DIAGNOSIS — R55 Syncope and collapse: Secondary | ICD-10-CM | POA: Diagnosis not present

## 2015-07-17 DIAGNOSIS — I10 Essential (primary) hypertension: Secondary | ICD-10-CM | POA: Diagnosis not present

## 2015-07-20 DIAGNOSIS — J189 Pneumonia, unspecified organism: Secondary | ICD-10-CM | POA: Diagnosis not present

## 2015-07-24 DIAGNOSIS — I10 Essential (primary) hypertension: Secondary | ICD-10-CM | POA: Diagnosis not present

## 2015-07-24 DIAGNOSIS — G4733 Obstructive sleep apnea (adult) (pediatric): Secondary | ICD-10-CM | POA: Diagnosis not present

## 2015-07-24 DIAGNOSIS — R269 Unspecified abnormalities of gait and mobility: Secondary | ICD-10-CM | POA: Diagnosis not present

## 2015-07-24 DIAGNOSIS — E119 Type 2 diabetes mellitus without complications: Secondary | ICD-10-CM | POA: Diagnosis not present

## 2015-07-24 DIAGNOSIS — I2510 Atherosclerotic heart disease of native coronary artery without angina pectoris: Secondary | ICD-10-CM | POA: Diagnosis not present

## 2015-07-24 DIAGNOSIS — R55 Syncope and collapse: Secondary | ICD-10-CM | POA: Diagnosis not present

## 2016-02-14 DIAGNOSIS — Z1389 Encounter for screening for other disorder: Secondary | ICD-10-CM | POA: Diagnosis not present

## 2016-02-14 DIAGNOSIS — R635 Abnormal weight gain: Secondary | ICD-10-CM | POA: Diagnosis not present

## 2016-02-14 DIAGNOSIS — I472 Ventricular tachycardia: Secondary | ICD-10-CM | POA: Diagnosis not present

## 2016-02-14 DIAGNOSIS — Z6841 Body Mass Index (BMI) 40.0 and over, adult: Secondary | ICD-10-CM | POA: Diagnosis not present

## 2016-02-14 DIAGNOSIS — E1129 Type 2 diabetes mellitus with other diabetic kidney complication: Secondary | ICD-10-CM | POA: Diagnosis not present

## 2016-02-14 DIAGNOSIS — K219 Gastro-esophageal reflux disease without esophagitis: Secondary | ICD-10-CM | POA: Diagnosis not present

## 2016-02-14 DIAGNOSIS — I10 Essential (primary) hypertension: Secondary | ICD-10-CM | POA: Diagnosis not present

## 2016-02-15 DIAGNOSIS — Z79899 Other long term (current) drug therapy: Secondary | ICD-10-CM | POA: Diagnosis not present

## 2016-02-15 DIAGNOSIS — D649 Anemia, unspecified: Secondary | ICD-10-CM | POA: Diagnosis not present

## 2016-10-30 DIAGNOSIS — E1129 Type 2 diabetes mellitus with other diabetic kidney complication: Secondary | ICD-10-CM | POA: Diagnosis not present

## 2016-10-30 DIAGNOSIS — E669 Obesity, unspecified: Secondary | ICD-10-CM | POA: Diagnosis not present

## 2016-10-30 DIAGNOSIS — I2510 Atherosclerotic heart disease of native coronary artery without angina pectoris: Secondary | ICD-10-CM | POA: Diagnosis not present

## 2016-10-30 DIAGNOSIS — L84 Corns and callosities: Secondary | ICD-10-CM | POA: Diagnosis not present

## 2016-10-30 DIAGNOSIS — Z0001 Encounter for general adult medical examination with abnormal findings: Secondary | ICD-10-CM | POA: Diagnosis not present

## 2016-10-30 DIAGNOSIS — I10 Essential (primary) hypertension: Secondary | ICD-10-CM | POA: Diagnosis not present

## 2016-10-30 DIAGNOSIS — Z6837 Body mass index (BMI) 37.0-37.9, adult: Secondary | ICD-10-CM | POA: Diagnosis not present

## 2016-10-30 DIAGNOSIS — R201 Hypoesthesia of skin: Secondary | ICD-10-CM | POA: Diagnosis not present

## 2016-10-30 DIAGNOSIS — Z1389 Encounter for screening for other disorder: Secondary | ICD-10-CM | POA: Diagnosis not present

## 2016-12-11 IMAGING — CT CT ANGIO CHEST
2 of 6 series · 6 of 36 positions shown · IV contrast (Omnipaque 300)
Comparison: 09/11/2007

CLINICAL DATA: Hypoxia.

EXAM:
CT ANGIOGRAPHY CHEST WITH CONTRAST
TECHNIQUE: Multidetector CT imaging of the chest was performed using the
standard protocol during bolus administration of intravenous
contrast. Multiplanar CT image reconstructions and MIPs were
obtained to evaluate the vascular anatomy.
CONTRAST:  75mL OMNIPAQUE IOHEXOL 350 MG/ML SOLN

[Series 4: pe 3.0 b40f · axial · 0.59mm/px · z∈[-218,-38]mm · 5 of 90 slices shown]
[im 15/90  lung]
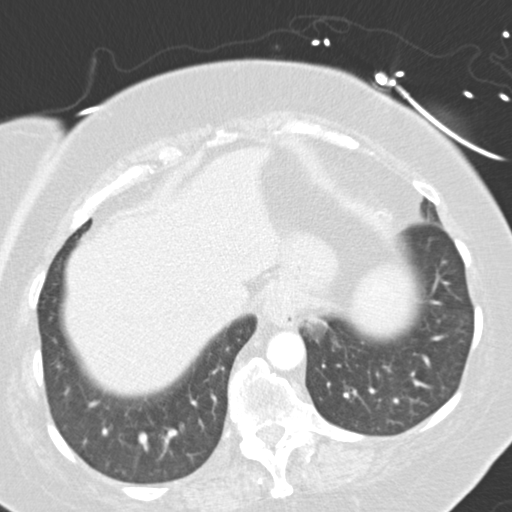
[im 30/90  mediastinal]
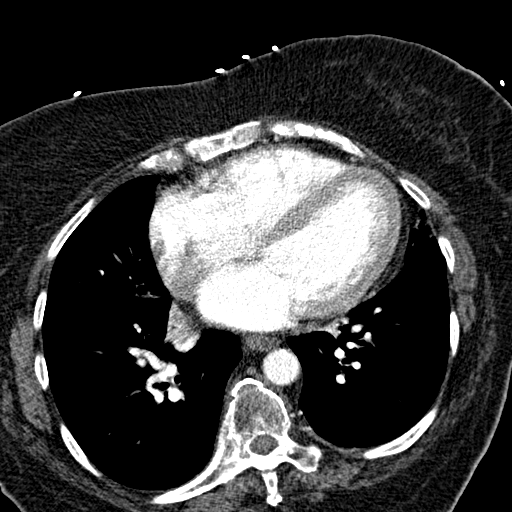
[im 45/90  lung]
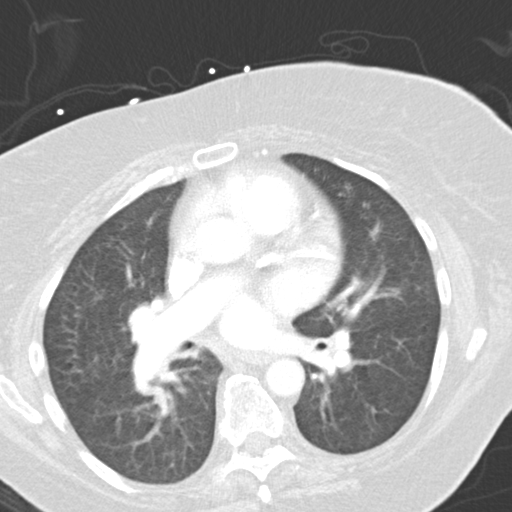
[im 60/90  mediastinal]
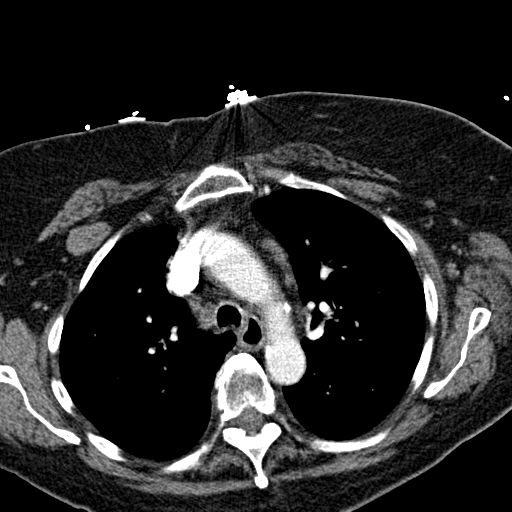
[im 75/90  lung]
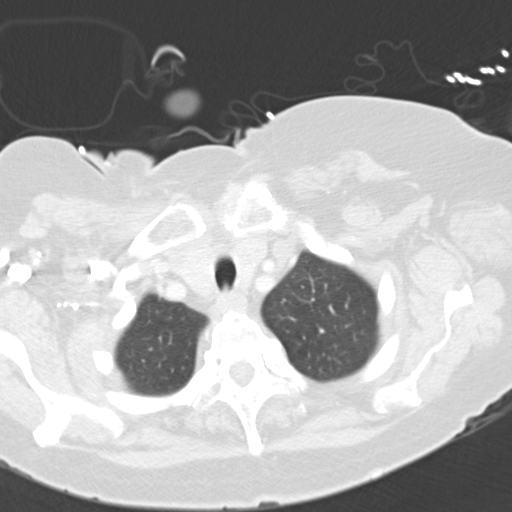

[Series 7: mpr coronal pe 3mm · coronal · 0.54mm/px · 1 of 79 slices shown]
[im 40/79  mediastinal]
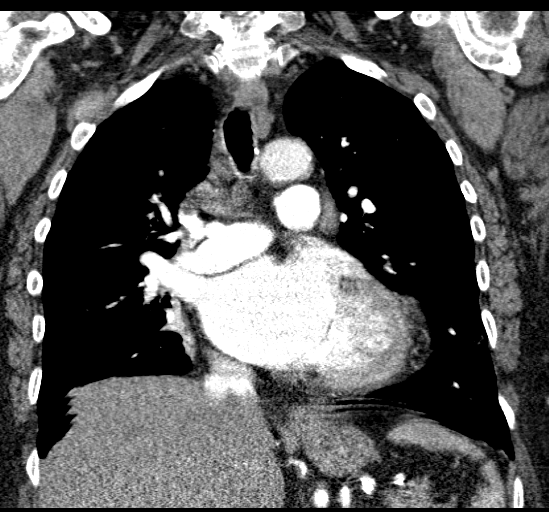

[6 of 36 positions shown; findings below may reference images not displayed]

FINDINGS: Cardiovascular: There is good opacification of the pulmonary
arteries. There is no pulmonary embolism. The thoracic aorta is
normal in caliber and intact.

Lungs: There is focal ground-glass opacity in the lateral inferior
periphery of the lingula. This may represent early infectious
infiltrate, but noninfectious inflammatory change or early
atelectasis are also considerations. Lungs are otherwise clear.

Central airways: Patent

Effusions: None

Lymphadenopathy: None

Esophagus: Unremarkable

Upper abdomen: No significant abnormality

Musculoskeletal: Bridging calcifications at the anterior aspect of
the vertebral column in the mid to lower thoracic spine, consistent
with diffuse idiopathic skeletal hyperostosis. No significant
skeletal lesions.

Review of the MIP images confirms the above findings.
IMPRESSION: 1. Negative for acute pulmonary embolism.
2. Patchy ground-glass opacity in the lateral lingula. This could
represent early infectious infiltrate or atelectasis.

## 2017-05-13 DIAGNOSIS — E161 Other hypoglycemia: Secondary | ICD-10-CM | POA: Diagnosis not present

## 2017-05-13 DIAGNOSIS — E162 Hypoglycemia, unspecified: Secondary | ICD-10-CM | POA: Diagnosis not present

## 2018-01-28 DIAGNOSIS — E039 Hypothyroidism, unspecified: Secondary | ICD-10-CM | POA: Diagnosis not present

## 2018-01-28 DIAGNOSIS — Z1389 Encounter for screening for other disorder: Secondary | ICD-10-CM | POA: Diagnosis not present

## 2018-01-28 DIAGNOSIS — Z0001 Encounter for general adult medical examination with abnormal findings: Secondary | ICD-10-CM | POA: Diagnosis not present

## 2018-01-28 DIAGNOSIS — N183 Chronic kidney disease, stage 3 (moderate): Secondary | ICD-10-CM | POA: Diagnosis not present

## 2018-01-28 DIAGNOSIS — E1129 Type 2 diabetes mellitus with other diabetic kidney complication: Secondary | ICD-10-CM | POA: Diagnosis not present

## 2018-01-28 DIAGNOSIS — E119 Type 2 diabetes mellitus without complications: Secondary | ICD-10-CM | POA: Diagnosis not present

## 2018-01-28 DIAGNOSIS — E6609 Other obesity due to excess calories: Secondary | ICD-10-CM | POA: Diagnosis not present

## 2018-01-28 DIAGNOSIS — Z6835 Body mass index (BMI) 35.0-35.9, adult: Secondary | ICD-10-CM | POA: Diagnosis not present

## 2018-01-28 DIAGNOSIS — I2510 Atherosclerotic heart disease of native coronary artery without angina pectoris: Secondary | ICD-10-CM | POA: Diagnosis not present

## 2018-01-28 DIAGNOSIS — I10 Essential (primary) hypertension: Secondary | ICD-10-CM | POA: Diagnosis not present

## 2018-02-16 ENCOUNTER — Other Ambulatory Visit (HOSPITAL_COMMUNITY): Payer: Self-pay | Admitting: Physician Assistant

## 2018-02-16 DIAGNOSIS — Z1231 Encounter for screening mammogram for malignant neoplasm of breast: Secondary | ICD-10-CM

## 2018-08-13 DIAGNOSIS — I10 Essential (primary) hypertension: Secondary | ICD-10-CM | POA: Diagnosis not present

## 2018-08-13 DIAGNOSIS — K219 Gastro-esophageal reflux disease without esophagitis: Secondary | ICD-10-CM | POA: Diagnosis not present

## 2018-08-13 DIAGNOSIS — N183 Chronic kidney disease, stage 3 (moderate): Secondary | ICD-10-CM | POA: Diagnosis not present

## 2018-08-13 DIAGNOSIS — F329 Major depressive disorder, single episode, unspecified: Secondary | ICD-10-CM | POA: Diagnosis not present

## 2018-08-13 DIAGNOSIS — Z681 Body mass index (BMI) 19 or less, adult: Secondary | ICD-10-CM | POA: Diagnosis not present

## 2018-08-13 DIAGNOSIS — E119 Type 2 diabetes mellitus without complications: Secondary | ICD-10-CM | POA: Diagnosis not present

## 2018-08-13 DIAGNOSIS — G43909 Migraine, unspecified, not intractable, without status migrainosus: Secondary | ICD-10-CM | POA: Diagnosis not present

## 2018-08-13 DIAGNOSIS — I2510 Atherosclerotic heart disease of native coronary artery without angina pectoris: Secondary | ICD-10-CM | POA: Diagnosis not present

## 2018-08-13 DIAGNOSIS — E785 Hyperlipidemia, unspecified: Secondary | ICD-10-CM | POA: Diagnosis not present

## 2018-09-02 DIAGNOSIS — W19XXXA Unspecified fall, initial encounter: Secondary | ICD-10-CM | POA: Diagnosis not present

## 2018-09-02 DIAGNOSIS — I10 Essential (primary) hypertension: Secondary | ICD-10-CM | POA: Diagnosis not present

## 2018-09-13 DIAGNOSIS — I10 Essential (primary) hypertension: Secondary | ICD-10-CM | POA: Diagnosis not present

## 2018-09-13 DIAGNOSIS — S299XXA Unspecified injury of thorax, initial encounter: Secondary | ICD-10-CM | POA: Diagnosis not present

## 2018-09-13 DIAGNOSIS — N179 Acute kidney failure, unspecified: Secondary | ICD-10-CM | POA: Diagnosis not present

## 2018-09-13 DIAGNOSIS — S0093XA Contusion of unspecified part of head, initial encounter: Secondary | ICD-10-CM | POA: Diagnosis not present

## 2018-09-13 DIAGNOSIS — R110 Nausea: Secondary | ICD-10-CM | POA: Diagnosis not present

## 2018-09-13 DIAGNOSIS — R4781 Slurred speech: Secondary | ICD-10-CM | POA: Diagnosis not present

## 2018-09-13 DIAGNOSIS — R7989 Other specified abnormal findings of blood chemistry: Secondary | ICD-10-CM | POA: Diagnosis not present

## 2018-09-13 DIAGNOSIS — J9621 Acute and chronic respiratory failure with hypoxia: Secondary | ICD-10-CM | POA: Diagnosis not present

## 2018-09-13 DIAGNOSIS — E1165 Type 2 diabetes mellitus with hyperglycemia: Secondary | ICD-10-CM | POA: Diagnosis not present

## 2018-09-13 DIAGNOSIS — R0989 Other specified symptoms and signs involving the circulatory and respiratory systems: Secondary | ICD-10-CM | POA: Diagnosis not present

## 2018-09-13 DIAGNOSIS — R0902 Hypoxemia: Secondary | ICD-10-CM | POA: Diagnosis not present

## 2018-09-13 DIAGNOSIS — S1980XA Other specified injuries of unspecified part of neck, initial encounter: Secondary | ICD-10-CM | POA: Diagnosis not present

## 2018-09-13 DIAGNOSIS — J189 Pneumonia, unspecified organism: Secondary | ICD-10-CM | POA: Diagnosis not present

## 2018-09-13 DIAGNOSIS — I272 Pulmonary hypertension, unspecified: Secondary | ICD-10-CM | POA: Diagnosis not present

## 2018-09-13 DIAGNOSIS — E860 Dehydration: Secondary | ICD-10-CM | POA: Diagnosis not present

## 2018-09-14 DIAGNOSIS — E118 Type 2 diabetes mellitus with unspecified complications: Secondary | ICD-10-CM | POA: Diagnosis not present

## 2018-09-14 DIAGNOSIS — Z9981 Dependence on supplemental oxygen: Secondary | ICD-10-CM | POA: Diagnosis not present

## 2018-09-14 DIAGNOSIS — E669 Obesity, unspecified: Secondary | ICD-10-CM | POA: Diagnosis present

## 2018-09-14 DIAGNOSIS — E119 Type 2 diabetes mellitus without complications: Secondary | ICD-10-CM | POA: Diagnosis not present

## 2018-09-14 DIAGNOSIS — M542 Cervicalgia: Secondary | ICD-10-CM | POA: Diagnosis present

## 2018-09-14 DIAGNOSIS — J189 Pneumonia, unspecified organism: Secondary | ICD-10-CM | POA: Diagnosis present

## 2018-09-14 DIAGNOSIS — Z7902 Long term (current) use of antithrombotics/antiplatelets: Secondary | ICD-10-CM | POA: Diagnosis not present

## 2018-09-14 DIAGNOSIS — I340 Nonrheumatic mitral (valve) insufficiency: Secondary | ICD-10-CM | POA: Diagnosis present

## 2018-09-14 DIAGNOSIS — J188 Other pneumonia, unspecified organism: Secondary | ICD-10-CM | POA: Diagnosis not present

## 2018-09-14 DIAGNOSIS — I272 Pulmonary hypertension, unspecified: Secondary | ICD-10-CM | POA: Diagnosis not present

## 2018-09-14 DIAGNOSIS — K219 Gastro-esophageal reflux disease without esophagitis: Secondary | ICD-10-CM | POA: Diagnosis present

## 2018-09-14 DIAGNOSIS — Z7984 Long term (current) use of oral hypoglycemic drugs: Secondary | ICD-10-CM | POA: Diagnosis not present

## 2018-09-14 DIAGNOSIS — R262 Difficulty in walking, not elsewhere classified: Secondary | ICD-10-CM | POA: Diagnosis not present

## 2018-09-14 DIAGNOSIS — R51 Headache: Secondary | ICD-10-CM | POA: Diagnosis present

## 2018-09-14 DIAGNOSIS — F0390 Unspecified dementia without behavioral disturbance: Secondary | ICD-10-CM | POA: Diagnosis not present

## 2018-09-14 DIAGNOSIS — F3289 Other specified depressive episodes: Secondary | ICD-10-CM | POA: Diagnosis not present

## 2018-09-14 DIAGNOSIS — E6609 Other obesity due to excess calories: Secondary | ICD-10-CM | POA: Diagnosis not present

## 2018-09-14 DIAGNOSIS — J9621 Acute and chronic respiratory failure with hypoxia: Secondary | ICD-10-CM | POA: Diagnosis present

## 2018-09-14 DIAGNOSIS — Z6833 Body mass index (BMI) 33.0-33.9, adult: Secondary | ICD-10-CM | POA: Diagnosis not present

## 2018-09-14 DIAGNOSIS — N179 Acute kidney failure, unspecified: Secondary | ICD-10-CM | POA: Diagnosis present

## 2018-09-14 DIAGNOSIS — J9601 Acute respiratory failure with hypoxia: Secondary | ICD-10-CM | POA: Diagnosis not present

## 2018-09-14 DIAGNOSIS — F419 Anxiety disorder, unspecified: Secondary | ICD-10-CM | POA: Diagnosis present

## 2018-09-14 DIAGNOSIS — R0902 Hypoxemia: Secondary | ICD-10-CM | POA: Diagnosis not present

## 2018-09-14 DIAGNOSIS — I2510 Atherosclerotic heart disease of native coronary artery without angina pectoris: Secondary | ICD-10-CM | POA: Diagnosis present

## 2018-09-14 DIAGNOSIS — M6281 Muscle weakness (generalized): Secondary | ICD-10-CM | POA: Diagnosis not present

## 2018-09-14 DIAGNOSIS — R296 Repeated falls: Secondary | ICD-10-CM | POA: Diagnosis not present

## 2018-09-14 DIAGNOSIS — N183 Chronic kidney disease, stage 3 (moderate): Secondary | ICD-10-CM | POA: Diagnosis present

## 2018-09-14 DIAGNOSIS — E876 Hypokalemia: Secondary | ICD-10-CM | POA: Diagnosis present

## 2018-09-14 DIAGNOSIS — R790 Abnormal level of blood mineral: Secondary | ICD-10-CM | POA: Diagnosis not present

## 2018-09-14 DIAGNOSIS — E1122 Type 2 diabetes mellitus with diabetic chronic kidney disease: Secondary | ICD-10-CM | POA: Diagnosis present

## 2018-09-14 DIAGNOSIS — J209 Acute bronchitis, unspecified: Secondary | ICD-10-CM | POA: Diagnosis not present

## 2018-09-14 DIAGNOSIS — I10 Essential (primary) hypertension: Secondary | ICD-10-CM | POA: Diagnosis not present

## 2018-09-14 DIAGNOSIS — Z9181 History of falling: Secondary | ICD-10-CM | POA: Diagnosis not present

## 2018-09-14 DIAGNOSIS — I129 Hypertensive chronic kidney disease with stage 1 through stage 4 chronic kidney disease, or unspecified chronic kidney disease: Secondary | ICD-10-CM | POA: Diagnosis present

## 2018-09-14 DIAGNOSIS — R7989 Other specified abnormal findings of blood chemistry: Secondary | ICD-10-CM | POA: Diagnosis present

## 2018-09-21 DIAGNOSIS — R0902 Hypoxemia: Secondary | ICD-10-CM | POA: Diagnosis not present

## 2018-09-21 DIAGNOSIS — R6520 Severe sepsis without septic shock: Secondary | ICD-10-CM | POA: Diagnosis not present

## 2018-09-21 DIAGNOSIS — R790 Abnormal level of blood mineral: Secondary | ICD-10-CM | POA: Diagnosis not present

## 2018-09-21 DIAGNOSIS — J189 Pneumonia, unspecified organism: Secondary | ICD-10-CM | POA: Diagnosis not present

## 2018-09-21 DIAGNOSIS — E6609 Other obesity due to excess calories: Secondary | ICD-10-CM | POA: Diagnosis not present

## 2018-09-21 DIAGNOSIS — J9620 Acute and chronic respiratory failure, unspecified whether with hypoxia or hypercapnia: Secondary | ICD-10-CM | POA: Diagnosis not present

## 2018-09-21 DIAGNOSIS — R339 Retention of urine, unspecified: Secondary | ICD-10-CM | POA: Diagnosis not present

## 2018-09-21 DIAGNOSIS — J209 Acute bronchitis, unspecified: Secondary | ICD-10-CM | POA: Diagnosis not present

## 2018-09-21 DIAGNOSIS — E785 Hyperlipidemia, unspecified: Secondary | ICD-10-CM | POA: Diagnosis present

## 2018-09-21 DIAGNOSIS — R404 Transient alteration of awareness: Secondary | ICD-10-CM | POA: Diagnosis not present

## 2018-09-21 DIAGNOSIS — J188 Other pneumonia, unspecified organism: Secondary | ICD-10-CM | POA: Diagnosis not present

## 2018-09-21 DIAGNOSIS — I2510 Atherosclerotic heart disease of native coronary artery without angina pectoris: Secondary | ICD-10-CM | POA: Diagnosis not present

## 2018-09-21 DIAGNOSIS — J9621 Acute and chronic respiratory failure with hypoxia: Secondary | ICD-10-CM | POA: Diagnosis not present

## 2018-09-21 DIAGNOSIS — Z794 Long term (current) use of insulin: Secondary | ICD-10-CM | POA: Diagnosis not present

## 2018-09-21 DIAGNOSIS — R630 Anorexia: Secondary | ICD-10-CM | POA: Diagnosis not present

## 2018-09-21 DIAGNOSIS — Z9181 History of falling: Secondary | ICD-10-CM | POA: Diagnosis not present

## 2018-09-21 DIAGNOSIS — I129 Hypertensive chronic kidney disease with stage 1 through stage 4 chronic kidney disease, or unspecified chronic kidney disease: Secondary | ICD-10-CM | POA: Diagnosis not present

## 2018-09-21 DIAGNOSIS — F329 Major depressive disorder, single episode, unspecified: Secondary | ICD-10-CM | POA: Diagnosis present

## 2018-09-21 DIAGNOSIS — R531 Weakness: Secondary | ICD-10-CM | POA: Diagnosis not present

## 2018-09-21 DIAGNOSIS — E876 Hypokalemia: Secondary | ICD-10-CM | POA: Diagnosis not present

## 2018-09-21 DIAGNOSIS — E860 Dehydration: Secondary | ICD-10-CM | POA: Diagnosis not present

## 2018-09-21 DIAGNOSIS — R4182 Altered mental status, unspecified: Secondary | ICD-10-CM | POA: Diagnosis not present

## 2018-09-21 DIAGNOSIS — F3289 Other specified depressive episodes: Secondary | ICD-10-CM | POA: Diagnosis not present

## 2018-09-21 DIAGNOSIS — E08649 Diabetes mellitus due to underlying condition with hypoglycemia without coma: Secondary | ICD-10-CM | POA: Diagnosis not present

## 2018-09-21 DIAGNOSIS — I10 Essential (primary) hypertension: Secondary | ICD-10-CM | POA: Diagnosis not present

## 2018-09-21 DIAGNOSIS — E118 Type 2 diabetes mellitus with unspecified complications: Secondary | ICD-10-CM | POA: Diagnosis not present

## 2018-09-21 DIAGNOSIS — N179 Acute kidney failure, unspecified: Secondary | ICD-10-CM | POA: Diagnosis not present

## 2018-09-21 DIAGNOSIS — R197 Diarrhea, unspecified: Secondary | ICD-10-CM | POA: Diagnosis not present

## 2018-09-21 DIAGNOSIS — G9341 Metabolic encephalopathy: Secondary | ICD-10-CM | POA: Diagnosis not present

## 2018-09-21 DIAGNOSIS — F064 Anxiety disorder due to known physiological condition: Secondary | ICD-10-CM | POA: Diagnosis not present

## 2018-09-21 DIAGNOSIS — R05 Cough: Secondary | ICD-10-CM | POA: Diagnosis not present

## 2018-09-21 DIAGNOSIS — E11649 Type 2 diabetes mellitus with hypoglycemia without coma: Secondary | ICD-10-CM | POA: Diagnosis present

## 2018-09-21 DIAGNOSIS — R4020 Unspecified coma: Secondary | ICD-10-CM | POA: Diagnosis not present

## 2018-09-21 DIAGNOSIS — A419 Sepsis, unspecified organism: Secondary | ICD-10-CM | POA: Diagnosis not present

## 2018-09-21 DIAGNOSIS — W19XXXA Unspecified fall, initial encounter: Secondary | ICD-10-CM | POA: Diagnosis not present

## 2018-09-21 DIAGNOSIS — R609 Edema, unspecified: Secondary | ICD-10-CM | POA: Diagnosis not present

## 2018-09-21 DIAGNOSIS — F419 Anxiety disorder, unspecified: Secondary | ICD-10-CM | POA: Diagnosis not present

## 2018-09-21 DIAGNOSIS — F0390 Unspecified dementia without behavioral disturbance: Secondary | ICD-10-CM | POA: Diagnosis not present

## 2018-09-21 DIAGNOSIS — I272 Pulmonary hypertension, unspecified: Secondary | ICD-10-CM | POA: Diagnosis not present

## 2018-09-21 DIAGNOSIS — E119 Type 2 diabetes mellitus without complications: Secondary | ICD-10-CM | POA: Diagnosis not present

## 2018-09-21 DIAGNOSIS — N183 Chronic kidney disease, stage 3 (moderate): Secondary | ICD-10-CM | POA: Diagnosis present

## 2018-09-21 DIAGNOSIS — M6281 Muscle weakness (generalized): Secondary | ICD-10-CM | POA: Diagnosis not present

## 2018-09-21 DIAGNOSIS — M6282 Rhabdomyolysis: Secondary | ICD-10-CM | POA: Diagnosis not present

## 2018-09-21 DIAGNOSIS — R296 Repeated falls: Secondary | ICD-10-CM | POA: Diagnosis not present

## 2018-09-21 DIAGNOSIS — F331 Major depressive disorder, recurrent, moderate: Secondary | ICD-10-CM | POA: Diagnosis not present

## 2018-09-21 DIAGNOSIS — Z66 Do not resuscitate: Secondary | ICD-10-CM | POA: Diagnosis not present

## 2018-09-21 DIAGNOSIS — E1122 Type 2 diabetes mellitus with diabetic chronic kidney disease: Secondary | ICD-10-CM | POA: Diagnosis present

## 2018-09-21 DIAGNOSIS — Z9981 Dependence on supplemental oxygen: Secondary | ICD-10-CM | POA: Diagnosis not present

## 2018-09-21 DIAGNOSIS — R262 Difficulty in walking, not elsewhere classified: Secondary | ICD-10-CM | POA: Diagnosis not present

## 2018-09-21 DIAGNOSIS — J9601 Acute respiratory failure with hypoxia: Secondary | ICD-10-CM | POA: Diagnosis not present

## 2018-09-21 DIAGNOSIS — R110 Nausea: Secondary | ICD-10-CM | POA: Diagnosis not present

## 2018-09-21 DIAGNOSIS — K219 Gastro-esophageal reflux disease without esophagitis: Secondary | ICD-10-CM | POA: Diagnosis not present

## 2018-09-21 DIAGNOSIS — F0150 Vascular dementia without behavioral disturbance: Secondary | ICD-10-CM | POA: Diagnosis present

## 2018-09-22 DIAGNOSIS — M6281 Muscle weakness (generalized): Secondary | ICD-10-CM | POA: Diagnosis not present

## 2018-09-22 DIAGNOSIS — I10 Essential (primary) hypertension: Secondary | ICD-10-CM | POA: Diagnosis not present

## 2018-09-22 DIAGNOSIS — E119 Type 2 diabetes mellitus without complications: Secondary | ICD-10-CM | POA: Diagnosis not present

## 2018-09-22 DIAGNOSIS — I2510 Atherosclerotic heart disease of native coronary artery without angina pectoris: Secondary | ICD-10-CM | POA: Diagnosis not present

## 2018-09-22 DIAGNOSIS — E876 Hypokalemia: Secondary | ICD-10-CM | POA: Diagnosis not present

## 2018-09-22 DIAGNOSIS — J189 Pneumonia, unspecified organism: Secondary | ICD-10-CM | POA: Diagnosis not present

## 2018-09-22 DIAGNOSIS — R609 Edema, unspecified: Secondary | ICD-10-CM | POA: Diagnosis not present

## 2018-09-22 DIAGNOSIS — K219 Gastro-esophageal reflux disease without esophagitis: Secondary | ICD-10-CM | POA: Diagnosis not present

## 2018-09-22 DIAGNOSIS — I272 Pulmonary hypertension, unspecified: Secondary | ICD-10-CM | POA: Diagnosis not present

## 2018-09-22 DIAGNOSIS — R05 Cough: Secondary | ICD-10-CM | POA: Diagnosis not present

## 2018-09-22 DIAGNOSIS — Z9181 History of falling: Secondary | ICD-10-CM | POA: Diagnosis not present

## 2018-09-22 DIAGNOSIS — J9620 Acute and chronic respiratory failure, unspecified whether with hypoxia or hypercapnia: Secondary | ICD-10-CM | POA: Diagnosis not present

## 2018-09-23 DIAGNOSIS — I272 Pulmonary hypertension, unspecified: Secondary | ICD-10-CM | POA: Diagnosis not present

## 2018-09-23 DIAGNOSIS — E876 Hypokalemia: Secondary | ICD-10-CM | POA: Diagnosis not present

## 2018-09-23 DIAGNOSIS — F419 Anxiety disorder, unspecified: Secondary | ICD-10-CM | POA: Diagnosis not present

## 2018-09-23 DIAGNOSIS — F3289 Other specified depressive episodes: Secondary | ICD-10-CM | POA: Diagnosis not present

## 2018-09-23 DIAGNOSIS — J189 Pneumonia, unspecified organism: Secondary | ICD-10-CM | POA: Diagnosis not present

## 2018-09-23 DIAGNOSIS — E118 Type 2 diabetes mellitus with unspecified complications: Secondary | ICD-10-CM | POA: Diagnosis not present

## 2018-09-23 DIAGNOSIS — I2510 Atherosclerotic heart disease of native coronary artery without angina pectoris: Secondary | ICD-10-CM | POA: Diagnosis not present

## 2018-09-23 DIAGNOSIS — K219 Gastro-esophageal reflux disease without esophagitis: Secondary | ICD-10-CM | POA: Diagnosis not present

## 2018-09-23 DIAGNOSIS — M6281 Muscle weakness (generalized): Secondary | ICD-10-CM | POA: Diagnosis not present

## 2018-09-23 DIAGNOSIS — I10 Essential (primary) hypertension: Secondary | ICD-10-CM | POA: Diagnosis not present

## 2018-09-25 DIAGNOSIS — E119 Type 2 diabetes mellitus without complications: Secondary | ICD-10-CM | POA: Diagnosis not present

## 2018-09-25 DIAGNOSIS — I2510 Atherosclerotic heart disease of native coronary artery without angina pectoris: Secondary | ICD-10-CM | POA: Diagnosis not present

## 2018-09-25 DIAGNOSIS — J189 Pneumonia, unspecified organism: Secondary | ICD-10-CM | POA: Diagnosis not present

## 2018-09-25 DIAGNOSIS — I272 Pulmonary hypertension, unspecified: Secondary | ICD-10-CM | POA: Diagnosis not present

## 2018-09-25 DIAGNOSIS — R110 Nausea: Secondary | ICD-10-CM | POA: Diagnosis not present

## 2018-09-25 DIAGNOSIS — M6281 Muscle weakness (generalized): Secondary | ICD-10-CM | POA: Diagnosis not present

## 2018-09-25 DIAGNOSIS — K219 Gastro-esophageal reflux disease without esophagitis: Secondary | ICD-10-CM | POA: Diagnosis not present

## 2018-09-25 DIAGNOSIS — Z9181 History of falling: Secondary | ICD-10-CM | POA: Diagnosis not present

## 2018-09-25 DIAGNOSIS — E876 Hypokalemia: Secondary | ICD-10-CM | POA: Diagnosis not present

## 2018-09-25 DIAGNOSIS — J9620 Acute and chronic respiratory failure, unspecified whether with hypoxia or hypercapnia: Secondary | ICD-10-CM | POA: Diagnosis not present

## 2018-09-25 DIAGNOSIS — I10 Essential (primary) hypertension: Secondary | ICD-10-CM | POA: Diagnosis not present

## 2018-09-25 DIAGNOSIS — R609 Edema, unspecified: Secondary | ICD-10-CM | POA: Diagnosis not present

## 2018-09-28 DIAGNOSIS — K219 Gastro-esophageal reflux disease without esophagitis: Secondary | ICD-10-CM | POA: Diagnosis not present

## 2018-09-28 DIAGNOSIS — J189 Pneumonia, unspecified organism: Secondary | ICD-10-CM | POA: Diagnosis not present

## 2018-09-28 DIAGNOSIS — I10 Essential (primary) hypertension: Secondary | ICD-10-CM | POA: Diagnosis not present

## 2018-09-28 DIAGNOSIS — R197 Diarrhea, unspecified: Secondary | ICD-10-CM | POA: Diagnosis not present

## 2018-09-28 DIAGNOSIS — M6281 Muscle weakness (generalized): Secondary | ICD-10-CM | POA: Diagnosis not present

## 2018-09-28 DIAGNOSIS — Z9181 History of falling: Secondary | ICD-10-CM | POA: Diagnosis not present

## 2018-09-28 DIAGNOSIS — J9620 Acute and chronic respiratory failure, unspecified whether with hypoxia or hypercapnia: Secondary | ICD-10-CM | POA: Diagnosis not present

## 2018-09-28 DIAGNOSIS — R609 Edema, unspecified: Secondary | ICD-10-CM | POA: Diagnosis not present

## 2018-09-28 DIAGNOSIS — I272 Pulmonary hypertension, unspecified: Secondary | ICD-10-CM | POA: Diagnosis not present

## 2018-09-28 DIAGNOSIS — E119 Type 2 diabetes mellitus without complications: Secondary | ICD-10-CM | POA: Diagnosis not present

## 2018-09-28 DIAGNOSIS — R630 Anorexia: Secondary | ICD-10-CM | POA: Diagnosis not present

## 2018-09-28 DIAGNOSIS — I2510 Atherosclerotic heart disease of native coronary artery without angina pectoris: Secondary | ICD-10-CM | POA: Diagnosis not present

## 2018-09-30 DIAGNOSIS — I2510 Atherosclerotic heart disease of native coronary artery without angina pectoris: Secondary | ICD-10-CM | POA: Diagnosis not present

## 2018-09-30 DIAGNOSIS — R609 Edema, unspecified: Secondary | ICD-10-CM | POA: Diagnosis not present

## 2018-09-30 DIAGNOSIS — J9620 Acute and chronic respiratory failure, unspecified whether with hypoxia or hypercapnia: Secondary | ICD-10-CM | POA: Diagnosis not present

## 2018-09-30 DIAGNOSIS — E119 Type 2 diabetes mellitus without complications: Secondary | ICD-10-CM | POA: Diagnosis not present

## 2018-09-30 DIAGNOSIS — I10 Essential (primary) hypertension: Secondary | ICD-10-CM | POA: Diagnosis not present

## 2018-09-30 DIAGNOSIS — K219 Gastro-esophageal reflux disease without esophagitis: Secondary | ICD-10-CM | POA: Diagnosis not present

## 2018-09-30 DIAGNOSIS — M6281 Muscle weakness (generalized): Secondary | ICD-10-CM | POA: Diagnosis not present

## 2018-09-30 DIAGNOSIS — Z9181 History of falling: Secondary | ICD-10-CM | POA: Diagnosis not present

## 2018-09-30 DIAGNOSIS — I272 Pulmonary hypertension, unspecified: Secondary | ICD-10-CM | POA: Diagnosis not present

## 2018-09-30 DIAGNOSIS — R197 Diarrhea, unspecified: Secondary | ICD-10-CM | POA: Diagnosis not present

## 2018-09-30 DIAGNOSIS — R630 Anorexia: Secondary | ICD-10-CM | POA: Diagnosis not present

## 2018-09-30 DIAGNOSIS — J189 Pneumonia, unspecified organism: Secondary | ICD-10-CM | POA: Diagnosis not present

## 2018-10-01 DIAGNOSIS — I10 Essential (primary) hypertension: Secondary | ICD-10-CM | POA: Diagnosis not present

## 2018-10-01 DIAGNOSIS — W19XXXA Unspecified fall, initial encounter: Secondary | ICD-10-CM | POA: Diagnosis not present

## 2018-10-01 DIAGNOSIS — R197 Diarrhea, unspecified: Secondary | ICD-10-CM | POA: Diagnosis not present

## 2018-10-01 DIAGNOSIS — R630 Anorexia: Secondary | ICD-10-CM | POA: Diagnosis not present

## 2018-10-02 DIAGNOSIS — Z9181 History of falling: Secondary | ICD-10-CM | POA: Diagnosis not present

## 2018-10-02 DIAGNOSIS — R609 Edema, unspecified: Secondary | ICD-10-CM | POA: Diagnosis not present

## 2018-10-02 DIAGNOSIS — M6281 Muscle weakness (generalized): Secondary | ICD-10-CM | POA: Diagnosis not present

## 2018-10-02 DIAGNOSIS — J189 Pneumonia, unspecified organism: Secondary | ICD-10-CM | POA: Diagnosis not present

## 2018-10-02 DIAGNOSIS — J9620 Acute and chronic respiratory failure, unspecified whether with hypoxia or hypercapnia: Secondary | ICD-10-CM | POA: Diagnosis not present

## 2018-10-02 DIAGNOSIS — K219 Gastro-esophageal reflux disease without esophagitis: Secondary | ICD-10-CM | POA: Diagnosis not present

## 2018-10-02 DIAGNOSIS — I2510 Atherosclerotic heart disease of native coronary artery without angina pectoris: Secondary | ICD-10-CM | POA: Diagnosis not present

## 2018-10-02 DIAGNOSIS — R630 Anorexia: Secondary | ICD-10-CM | POA: Diagnosis not present

## 2018-10-02 DIAGNOSIS — I272 Pulmonary hypertension, unspecified: Secondary | ICD-10-CM | POA: Diagnosis not present

## 2018-10-02 DIAGNOSIS — I10 Essential (primary) hypertension: Secondary | ICD-10-CM | POA: Diagnosis not present

## 2018-10-02 DIAGNOSIS — R197 Diarrhea, unspecified: Secondary | ICD-10-CM | POA: Diagnosis not present

## 2018-10-02 DIAGNOSIS — E119 Type 2 diabetes mellitus without complications: Secondary | ICD-10-CM | POA: Diagnosis not present

## 2018-10-05 DIAGNOSIS — R4182 Altered mental status, unspecified: Secondary | ICD-10-CM | POA: Diagnosis not present

## 2018-10-05 DIAGNOSIS — R262 Difficulty in walking, not elsewhere classified: Secondary | ICD-10-CM | POA: Diagnosis not present

## 2018-10-05 DIAGNOSIS — R531 Weakness: Secondary | ICD-10-CM | POA: Diagnosis not present

## 2018-10-05 DIAGNOSIS — R410 Disorientation, unspecified: Secondary | ICD-10-CM | POA: Diagnosis not present

## 2018-10-05 DIAGNOSIS — Z7401 Bed confinement status: Secondary | ICD-10-CM | POA: Diagnosis not present

## 2018-10-05 DIAGNOSIS — R404 Transient alteration of awareness: Secondary | ICD-10-CM | POA: Diagnosis not present

## 2018-10-05 DIAGNOSIS — R278 Other lack of coordination: Secondary | ICD-10-CM | POA: Diagnosis not present

## 2018-10-05 DIAGNOSIS — E118 Type 2 diabetes mellitus with unspecified complications: Secondary | ICD-10-CM | POA: Diagnosis not present

## 2018-10-05 DIAGNOSIS — K219 Gastro-esophageal reflux disease without esophagitis: Secondary | ICD-10-CM | POA: Diagnosis not present

## 2018-10-05 DIAGNOSIS — F419 Anxiety disorder, unspecified: Secondary | ICD-10-CM | POA: Diagnosis present

## 2018-10-05 DIAGNOSIS — G9341 Metabolic encephalopathy: Secondary | ICD-10-CM | POA: Diagnosis present

## 2018-10-05 DIAGNOSIS — R630 Anorexia: Secondary | ICD-10-CM | POA: Diagnosis not present

## 2018-10-05 DIAGNOSIS — J9601 Acute respiratory failure with hypoxia: Secondary | ICD-10-CM | POA: Diagnosis not present

## 2018-10-05 DIAGNOSIS — R52 Pain, unspecified: Secondary | ICD-10-CM | POA: Diagnosis not present

## 2018-10-05 DIAGNOSIS — Z794 Long term (current) use of insulin: Secondary | ICD-10-CM | POA: Diagnosis not present

## 2018-10-05 DIAGNOSIS — M2550 Pain in unspecified joint: Secondary | ICD-10-CM | POA: Diagnosis not present

## 2018-10-05 DIAGNOSIS — E08649 Diabetes mellitus due to underlying condition with hypoglycemia without coma: Secondary | ICD-10-CM | POA: Diagnosis not present

## 2018-10-05 DIAGNOSIS — M6281 Muscle weakness (generalized): Secondary | ICD-10-CM | POA: Diagnosis not present

## 2018-10-05 DIAGNOSIS — J189 Pneumonia, unspecified organism: Secondary | ICD-10-CM | POA: Diagnosis not present

## 2018-10-05 DIAGNOSIS — F0150 Vascular dementia without behavioral disturbance: Secondary | ICD-10-CM | POA: Diagnosis present

## 2018-10-05 DIAGNOSIS — I10 Essential (primary) hypertension: Secondary | ICD-10-CM | POA: Diagnosis not present

## 2018-10-05 DIAGNOSIS — E860 Dehydration: Secondary | ICD-10-CM | POA: Diagnosis present

## 2018-10-05 DIAGNOSIS — E119 Type 2 diabetes mellitus without complications: Secondary | ICD-10-CM | POA: Diagnosis not present

## 2018-10-05 DIAGNOSIS — R4020 Unspecified coma: Secondary | ICD-10-CM | POA: Diagnosis not present

## 2018-10-05 DIAGNOSIS — F3289 Other specified depressive episodes: Secondary | ICD-10-CM | POA: Diagnosis not present

## 2018-10-05 DIAGNOSIS — E6609 Other obesity due to excess calories: Secondary | ICD-10-CM | POA: Diagnosis not present

## 2018-10-05 DIAGNOSIS — R197 Diarrhea, unspecified: Secondary | ICD-10-CM | POA: Diagnosis not present

## 2018-10-05 DIAGNOSIS — Z9181 History of falling: Secondary | ICD-10-CM | POA: Diagnosis not present

## 2018-10-05 DIAGNOSIS — I129 Hypertensive chronic kidney disease with stage 1 through stage 4 chronic kidney disease, or unspecified chronic kidney disease: Secondary | ICD-10-CM | POA: Diagnosis present

## 2018-10-05 DIAGNOSIS — F329 Major depressive disorder, single episode, unspecified: Secondary | ICD-10-CM | POA: Diagnosis present

## 2018-10-05 DIAGNOSIS — J9621 Acute and chronic respiratory failure with hypoxia: Secondary | ICD-10-CM | POA: Diagnosis not present

## 2018-10-05 DIAGNOSIS — I272 Pulmonary hypertension, unspecified: Secondary | ICD-10-CM | POA: Diagnosis not present

## 2018-10-05 DIAGNOSIS — Z66 Do not resuscitate: Secondary | ICD-10-CM | POA: Diagnosis not present

## 2018-10-05 DIAGNOSIS — N179 Acute kidney failure, unspecified: Secondary | ICD-10-CM | POA: Diagnosis present

## 2018-10-05 DIAGNOSIS — E1122 Type 2 diabetes mellitus with diabetic chronic kidney disease: Secondary | ICD-10-CM | POA: Diagnosis present

## 2018-10-05 DIAGNOSIS — N183 Chronic kidney disease, stage 3 (moderate): Secondary | ICD-10-CM | POA: Diagnosis present

## 2018-10-05 DIAGNOSIS — R296 Repeated falls: Secondary | ICD-10-CM | POA: Diagnosis not present

## 2018-10-05 DIAGNOSIS — J9620 Acute and chronic respiratory failure, unspecified whether with hypoxia or hypercapnia: Secondary | ICD-10-CM | POA: Diagnosis not present

## 2018-10-05 DIAGNOSIS — I2510 Atherosclerotic heart disease of native coronary artery without angina pectoris: Secondary | ICD-10-CM | POA: Diagnosis not present

## 2018-10-05 DIAGNOSIS — R339 Retention of urine, unspecified: Secondary | ICD-10-CM | POA: Diagnosis not present

## 2018-10-05 DIAGNOSIS — R609 Edema, unspecified: Secondary | ICD-10-CM | POA: Diagnosis not present

## 2018-10-05 DIAGNOSIS — R6520 Severe sepsis without septic shock: Secondary | ICD-10-CM | POA: Diagnosis present

## 2018-10-05 DIAGNOSIS — F0390 Unspecified dementia without behavioral disturbance: Secondary | ICD-10-CM | POA: Diagnosis not present

## 2018-10-05 DIAGNOSIS — E11649 Type 2 diabetes mellitus with hypoglycemia without coma: Secondary | ICD-10-CM | POA: Diagnosis present

## 2018-10-05 DIAGNOSIS — M6282 Rhabdomyolysis: Secondary | ICD-10-CM | POA: Diagnosis present

## 2018-10-05 DIAGNOSIS — A419 Sepsis, unspecified organism: Secondary | ICD-10-CM | POA: Diagnosis present

## 2018-10-05 DIAGNOSIS — Z741 Need for assistance with personal care: Secondary | ICD-10-CM | POA: Diagnosis not present

## 2018-10-05 DIAGNOSIS — E785 Hyperlipidemia, unspecified: Secondary | ICD-10-CM | POA: Diagnosis present

## 2018-10-09 DIAGNOSIS — R52 Pain, unspecified: Secondary | ICD-10-CM | POA: Diagnosis not present

## 2018-10-09 DIAGNOSIS — M6282 Rhabdomyolysis: Secondary | ICD-10-CM | POA: Diagnosis not present

## 2018-10-09 DIAGNOSIS — J9621 Acute and chronic respiratory failure with hypoxia: Secondary | ICD-10-CM | POA: Diagnosis not present

## 2018-10-09 DIAGNOSIS — N183 Chronic kidney disease, stage 3 (moderate): Secondary | ICD-10-CM | POA: Diagnosis not present

## 2018-10-09 DIAGNOSIS — R296 Repeated falls: Secondary | ICD-10-CM | POA: Diagnosis not present

## 2018-10-09 DIAGNOSIS — F064 Anxiety disorder due to known physiological condition: Secondary | ICD-10-CM | POA: Diagnosis not present

## 2018-10-09 DIAGNOSIS — F0390 Unspecified dementia without behavioral disturbance: Secondary | ICD-10-CM | POA: Diagnosis not present

## 2018-10-09 DIAGNOSIS — R609 Edema, unspecified: Secondary | ICD-10-CM | POA: Diagnosis not present

## 2018-10-09 DIAGNOSIS — Z7401 Bed confinement status: Secondary | ICD-10-CM | POA: Diagnosis not present

## 2018-10-09 DIAGNOSIS — R262 Difficulty in walking, not elsewhere classified: Secondary | ICD-10-CM | POA: Diagnosis not present

## 2018-10-09 DIAGNOSIS — J9620 Acute and chronic respiratory failure, unspecified whether with hypoxia or hypercapnia: Secondary | ICD-10-CM | POA: Diagnosis not present

## 2018-10-09 DIAGNOSIS — M6281 Muscle weakness (generalized): Secondary | ICD-10-CM | POA: Diagnosis not present

## 2018-10-09 DIAGNOSIS — F3289 Other specified depressive episodes: Secondary | ICD-10-CM | POA: Diagnosis not present

## 2018-10-09 DIAGNOSIS — R05 Cough: Secondary | ICD-10-CM | POA: Diagnosis not present

## 2018-10-09 DIAGNOSIS — I10 Essential (primary) hypertension: Secondary | ICD-10-CM | POA: Diagnosis not present

## 2018-10-09 DIAGNOSIS — J9601 Acute respiratory failure with hypoxia: Secondary | ICD-10-CM | POA: Diagnosis not present

## 2018-10-09 DIAGNOSIS — I272 Pulmonary hypertension, unspecified: Secondary | ICD-10-CM | POA: Diagnosis not present

## 2018-10-09 DIAGNOSIS — E669 Obesity, unspecified: Secondary | ICD-10-CM | POA: Diagnosis not present

## 2018-10-09 DIAGNOSIS — G9341 Metabolic encephalopathy: Secondary | ICD-10-CM | POA: Diagnosis not present

## 2018-10-09 DIAGNOSIS — F331 Major depressive disorder, recurrent, moderate: Secondary | ICD-10-CM | POA: Diagnosis not present

## 2018-10-09 DIAGNOSIS — J189 Pneumonia, unspecified organism: Secondary | ICD-10-CM | POA: Diagnosis not present

## 2018-10-09 DIAGNOSIS — E11649 Type 2 diabetes mellitus with hypoglycemia without coma: Secondary | ICD-10-CM | POA: Diagnosis not present

## 2018-10-09 DIAGNOSIS — F419 Anxiety disorder, unspecified: Secondary | ICD-10-CM | POA: Diagnosis not present

## 2018-10-09 DIAGNOSIS — E876 Hypokalemia: Secondary | ICD-10-CM | POA: Diagnosis not present

## 2018-10-09 DIAGNOSIS — E118 Type 2 diabetes mellitus with unspecified complications: Secondary | ICD-10-CM | POA: Diagnosis not present

## 2018-10-09 DIAGNOSIS — R278 Other lack of coordination: Secondary | ICD-10-CM | POA: Diagnosis not present

## 2018-10-09 DIAGNOSIS — A419 Sepsis, unspecified organism: Secondary | ICD-10-CM | POA: Diagnosis not present

## 2018-10-09 DIAGNOSIS — Z741 Need for assistance with personal care: Secondary | ICD-10-CM | POA: Diagnosis not present

## 2018-10-09 DIAGNOSIS — R410 Disorientation, unspecified: Secondary | ICD-10-CM | POA: Diagnosis not present

## 2018-10-09 DIAGNOSIS — E119 Type 2 diabetes mellitus without complications: Secondary | ICD-10-CM | POA: Diagnosis not present

## 2018-10-09 DIAGNOSIS — I2510 Atherosclerotic heart disease of native coronary artery without angina pectoris: Secondary | ICD-10-CM | POA: Diagnosis not present

## 2018-10-09 DIAGNOSIS — E6609 Other obesity due to excess calories: Secondary | ICD-10-CM | POA: Diagnosis not present

## 2018-10-09 DIAGNOSIS — K219 Gastro-esophageal reflux disease without esophagitis: Secondary | ICD-10-CM | POA: Diagnosis not present

## 2018-10-09 DIAGNOSIS — Z9181 History of falling: Secondary | ICD-10-CM | POA: Diagnosis not present

## 2018-10-09 DIAGNOSIS — M2550 Pain in unspecified joint: Secondary | ICD-10-CM | POA: Diagnosis not present

## 2018-10-12 DIAGNOSIS — M6281 Muscle weakness (generalized): Secondary | ICD-10-CM | POA: Diagnosis not present

## 2018-10-12 DIAGNOSIS — R05 Cough: Secondary | ICD-10-CM | POA: Diagnosis not present

## 2018-10-12 DIAGNOSIS — I10 Essential (primary) hypertension: Secondary | ICD-10-CM | POA: Diagnosis not present

## 2018-10-12 DIAGNOSIS — J189 Pneumonia, unspecified organism: Secondary | ICD-10-CM | POA: Diagnosis not present

## 2018-10-12 DIAGNOSIS — R609 Edema, unspecified: Secondary | ICD-10-CM | POA: Diagnosis not present

## 2018-10-12 DIAGNOSIS — Z9181 History of falling: Secondary | ICD-10-CM | POA: Diagnosis not present

## 2018-10-12 DIAGNOSIS — E669 Obesity, unspecified: Secondary | ICD-10-CM | POA: Diagnosis not present

## 2018-10-12 DIAGNOSIS — E876 Hypokalemia: Secondary | ICD-10-CM | POA: Diagnosis not present

## 2018-10-12 DIAGNOSIS — E119 Type 2 diabetes mellitus without complications: Secondary | ICD-10-CM | POA: Diagnosis not present

## 2018-10-12 DIAGNOSIS — K219 Gastro-esophageal reflux disease without esophagitis: Secondary | ICD-10-CM | POA: Diagnosis not present

## 2018-10-12 DIAGNOSIS — I2510 Atherosclerotic heart disease of native coronary artery without angina pectoris: Secondary | ICD-10-CM | POA: Diagnosis not present

## 2018-10-12 DIAGNOSIS — J9620 Acute and chronic respiratory failure, unspecified whether with hypoxia or hypercapnia: Secondary | ICD-10-CM | POA: Diagnosis not present

## 2018-10-14 DIAGNOSIS — E669 Obesity, unspecified: Secondary | ICD-10-CM | POA: Diagnosis not present

## 2018-10-14 DIAGNOSIS — R609 Edema, unspecified: Secondary | ICD-10-CM | POA: Diagnosis not present

## 2018-10-14 DIAGNOSIS — J189 Pneumonia, unspecified organism: Secondary | ICD-10-CM | POA: Diagnosis not present

## 2018-10-14 DIAGNOSIS — Z9181 History of falling: Secondary | ICD-10-CM | POA: Diagnosis not present

## 2018-10-14 DIAGNOSIS — K219 Gastro-esophageal reflux disease without esophagitis: Secondary | ICD-10-CM | POA: Diagnosis not present

## 2018-10-14 DIAGNOSIS — E119 Type 2 diabetes mellitus without complications: Secondary | ICD-10-CM | POA: Diagnosis not present

## 2018-10-14 DIAGNOSIS — I2510 Atherosclerotic heart disease of native coronary artery without angina pectoris: Secondary | ICD-10-CM | POA: Diagnosis not present

## 2018-10-14 DIAGNOSIS — E876 Hypokalemia: Secondary | ICD-10-CM | POA: Diagnosis not present

## 2018-10-14 DIAGNOSIS — M6281 Muscle weakness (generalized): Secondary | ICD-10-CM | POA: Diagnosis not present

## 2018-10-14 DIAGNOSIS — R05 Cough: Secondary | ICD-10-CM | POA: Diagnosis not present

## 2018-10-14 DIAGNOSIS — J9620 Acute and chronic respiratory failure, unspecified whether with hypoxia or hypercapnia: Secondary | ICD-10-CM | POA: Diagnosis not present

## 2018-10-14 DIAGNOSIS — I10 Essential (primary) hypertension: Secondary | ICD-10-CM | POA: Diagnosis not present

## 2018-10-16 DIAGNOSIS — M6281 Muscle weakness (generalized): Secondary | ICD-10-CM | POA: Diagnosis not present

## 2018-10-16 DIAGNOSIS — R609 Edema, unspecified: Secondary | ICD-10-CM | POA: Diagnosis not present

## 2018-10-16 DIAGNOSIS — E669 Obesity, unspecified: Secondary | ICD-10-CM | POA: Diagnosis not present

## 2018-10-16 DIAGNOSIS — E119 Type 2 diabetes mellitus without complications: Secondary | ICD-10-CM | POA: Diagnosis not present

## 2018-10-16 DIAGNOSIS — Z9181 History of falling: Secondary | ICD-10-CM | POA: Diagnosis not present

## 2018-10-16 DIAGNOSIS — J9620 Acute and chronic respiratory failure, unspecified whether with hypoxia or hypercapnia: Secondary | ICD-10-CM | POA: Diagnosis not present

## 2018-10-16 DIAGNOSIS — E876 Hypokalemia: Secondary | ICD-10-CM | POA: Diagnosis not present

## 2018-10-16 DIAGNOSIS — J189 Pneumonia, unspecified organism: Secondary | ICD-10-CM | POA: Diagnosis not present

## 2018-10-16 DIAGNOSIS — I10 Essential (primary) hypertension: Secondary | ICD-10-CM | POA: Diagnosis not present

## 2018-10-16 DIAGNOSIS — K219 Gastro-esophageal reflux disease without esophagitis: Secondary | ICD-10-CM | POA: Diagnosis not present

## 2018-10-16 DIAGNOSIS — I2510 Atherosclerotic heart disease of native coronary artery without angina pectoris: Secondary | ICD-10-CM | POA: Diagnosis not present

## 2018-10-16 DIAGNOSIS — R05 Cough: Secondary | ICD-10-CM | POA: Diagnosis not present

## 2018-10-19 DIAGNOSIS — M6281 Muscle weakness (generalized): Secondary | ICD-10-CM | POA: Diagnosis not present

## 2018-10-19 DIAGNOSIS — J189 Pneumonia, unspecified organism: Secondary | ICD-10-CM | POA: Diagnosis not present

## 2018-10-19 DIAGNOSIS — I2510 Atherosclerotic heart disease of native coronary artery without angina pectoris: Secondary | ICD-10-CM | POA: Diagnosis not present

## 2018-10-19 DIAGNOSIS — I10 Essential (primary) hypertension: Secondary | ICD-10-CM | POA: Diagnosis not present

## 2018-10-19 DIAGNOSIS — J9620 Acute and chronic respiratory failure, unspecified whether with hypoxia or hypercapnia: Secondary | ICD-10-CM | POA: Diagnosis not present

## 2018-10-19 DIAGNOSIS — E876 Hypokalemia: Secondary | ICD-10-CM | POA: Diagnosis not present

## 2018-10-19 DIAGNOSIS — E669 Obesity, unspecified: Secondary | ICD-10-CM | POA: Diagnosis not present

## 2018-10-19 DIAGNOSIS — E119 Type 2 diabetes mellitus without complications: Secondary | ICD-10-CM | POA: Diagnosis not present

## 2018-10-19 DIAGNOSIS — R609 Edema, unspecified: Secondary | ICD-10-CM | POA: Diagnosis not present

## 2018-10-19 DIAGNOSIS — K219 Gastro-esophageal reflux disease without esophagitis: Secondary | ICD-10-CM | POA: Diagnosis not present

## 2018-10-19 DIAGNOSIS — Z9181 History of falling: Secondary | ICD-10-CM | POA: Diagnosis not present

## 2018-10-19 DIAGNOSIS — R05 Cough: Secondary | ICD-10-CM | POA: Diagnosis not present

## 2018-10-21 DIAGNOSIS — K219 Gastro-esophageal reflux disease without esophagitis: Secondary | ICD-10-CM | POA: Diagnosis not present

## 2018-10-21 DIAGNOSIS — F419 Anxiety disorder, unspecified: Secondary | ICD-10-CM | POA: Diagnosis not present

## 2018-10-21 DIAGNOSIS — I10 Essential (primary) hypertension: Secondary | ICD-10-CM | POA: Diagnosis not present

## 2018-10-21 DIAGNOSIS — E119 Type 2 diabetes mellitus without complications: Secondary | ICD-10-CM | POA: Diagnosis not present

## 2018-10-21 DIAGNOSIS — R05 Cough: Secondary | ICD-10-CM | POA: Diagnosis not present

## 2018-10-21 DIAGNOSIS — M6281 Muscle weakness (generalized): Secondary | ICD-10-CM | POA: Diagnosis not present

## 2018-10-21 DIAGNOSIS — F331 Major depressive disorder, recurrent, moderate: Secondary | ICD-10-CM | POA: Diagnosis not present

## 2018-10-21 DIAGNOSIS — I2510 Atherosclerotic heart disease of native coronary artery without angina pectoris: Secondary | ICD-10-CM | POA: Diagnosis not present

## 2018-10-21 DIAGNOSIS — E876 Hypokalemia: Secondary | ICD-10-CM | POA: Diagnosis not present

## 2018-10-27 DIAGNOSIS — F064 Anxiety disorder due to known physiological condition: Secondary | ICD-10-CM | POA: Diagnosis not present

## 2018-10-27 DIAGNOSIS — F331 Major depressive disorder, recurrent, moderate: Secondary | ICD-10-CM | POA: Diagnosis not present

## 2018-10-28 DIAGNOSIS — I10 Essential (primary) hypertension: Secondary | ICD-10-CM | POA: Diagnosis not present

## 2018-10-28 DIAGNOSIS — E785 Hyperlipidemia, unspecified: Secondary | ICD-10-CM | POA: Diagnosis not present

## 2018-10-28 DIAGNOSIS — Z79899 Other long term (current) drug therapy: Secondary | ICD-10-CM | POA: Diagnosis not present

## 2018-10-28 DIAGNOSIS — I2510 Atherosclerotic heart disease of native coronary artery without angina pectoris: Secondary | ICD-10-CM | POA: Diagnosis not present

## 2018-10-29 DIAGNOSIS — Z23 Encounter for immunization: Secondary | ICD-10-CM | POA: Diagnosis not present

## 2018-10-29 DIAGNOSIS — Z6834 Body mass index (BMI) 34.0-34.9, adult: Secondary | ICD-10-CM | POA: Diagnosis not present

## 2018-10-29 DIAGNOSIS — E1129 Type 2 diabetes mellitus with other diabetic kidney complication: Secondary | ICD-10-CM | POA: Diagnosis not present

## 2018-10-29 DIAGNOSIS — L03317 Cellulitis of buttock: Secondary | ICD-10-CM | POA: Diagnosis not present

## 2018-10-29 DIAGNOSIS — I10 Essential (primary) hypertension: Secondary | ICD-10-CM | POA: Diagnosis not present

## 2018-10-29 DIAGNOSIS — N183 Chronic kidney disease, stage 3 (moderate): Secondary | ICD-10-CM | POA: Diagnosis not present

## 2018-11-24 DIAGNOSIS — I2510 Atherosclerotic heart disease of native coronary artery without angina pectoris: Secondary | ICD-10-CM | POA: Diagnosis not present

## 2018-11-24 DIAGNOSIS — I10 Essential (primary) hypertension: Secondary | ICD-10-CM | POA: Diagnosis not present

## 2018-11-24 DIAGNOSIS — E785 Hyperlipidemia, unspecified: Secondary | ICD-10-CM | POA: Diagnosis not present

## 2018-11-24 DIAGNOSIS — E119 Type 2 diabetes mellitus without complications: Secondary | ICD-10-CM | POA: Diagnosis not present

## 2018-12-07 ENCOUNTER — Inpatient Hospital Stay (HOSPITAL_COMMUNITY)
Admission: EM | Admit: 2018-12-07 | Discharge: 2018-12-12 | DRG: 304 | Disposition: A | Discharge status: Home Health | Payer: Medicare Other | Attending: Internal Medicine | Admitting: Internal Medicine

## 2018-12-07 ENCOUNTER — Encounter (HOSPITAL_COMMUNITY): Payer: Self-pay | Admitting: Emergency Medicine

## 2018-12-07 ENCOUNTER — Inpatient Hospital Stay (HOSPITAL_COMMUNITY): Payer: Medicare Other

## 2018-12-07 ENCOUNTER — Other Ambulatory Visit: Payer: Self-pay

## 2018-12-07 ENCOUNTER — Emergency Department (HOSPITAL_COMMUNITY): Payer: Medicare Other

## 2018-12-07 DIAGNOSIS — R52 Pain, unspecified: Secondary | ICD-10-CM | POA: Diagnosis not present

## 2018-12-07 DIAGNOSIS — G9341 Metabolic encephalopathy: Secondary | ICD-10-CM | POA: Diagnosis not present

## 2018-12-07 DIAGNOSIS — R519 Headache, unspecified: Secondary | ICD-10-CM | POA: Diagnosis present

## 2018-12-07 DIAGNOSIS — E1165 Type 2 diabetes mellitus with hyperglycemia: Secondary | ICD-10-CM | POA: Diagnosis not present

## 2018-12-07 DIAGNOSIS — D72829 Elevated white blood cell count, unspecified: Secondary | ICD-10-CM

## 2018-12-07 DIAGNOSIS — R338 Other retention of urine: Secondary | ICD-10-CM | POA: Diagnosis not present

## 2018-12-07 DIAGNOSIS — I272 Pulmonary hypertension, unspecified: Secondary | ICD-10-CM | POA: Diagnosis present

## 2018-12-07 DIAGNOSIS — E119 Type 2 diabetes mellitus without complications: Secondary | ICD-10-CM | POA: Diagnosis present

## 2018-12-07 DIAGNOSIS — E2740 Unspecified adrenocortical insufficiency: Secondary | ICD-10-CM | POA: Diagnosis present

## 2018-12-07 DIAGNOSIS — D649 Anemia, unspecified: Secondary | ICD-10-CM | POA: Diagnosis not present

## 2018-12-07 DIAGNOSIS — E876 Hypokalemia: Secondary | ICD-10-CM | POA: Diagnosis not present

## 2018-12-07 DIAGNOSIS — R10819 Abdominal tenderness, unspecified site: Secondary | ICD-10-CM

## 2018-12-07 DIAGNOSIS — G4733 Obstructive sleep apnea (adult) (pediatric): Secondary | ICD-10-CM | POA: Diagnosis present

## 2018-12-07 DIAGNOSIS — F419 Anxiety disorder, unspecified: Secondary | ICD-10-CM | POA: Diagnosis present

## 2018-12-07 DIAGNOSIS — Z7902 Long term (current) use of antithrombotics/antiplatelets: Secondary | ICD-10-CM

## 2018-12-07 DIAGNOSIS — I2510 Atherosclerotic heart disease of native coronary artery without angina pectoris: Secondary | ICD-10-CM | POA: Diagnosis present

## 2018-12-07 DIAGNOSIS — Z79899 Other long term (current) drug therapy: Secondary | ICD-10-CM

## 2018-12-07 DIAGNOSIS — R51 Headache: Secondary | ICD-10-CM

## 2018-12-07 DIAGNOSIS — E860 Dehydration: Secondary | ICD-10-CM | POA: Diagnosis present

## 2018-12-07 DIAGNOSIS — S60221A Contusion of right hand, initial encounter: Secondary | ICD-10-CM | POA: Diagnosis not present

## 2018-12-07 DIAGNOSIS — I10 Essential (primary) hypertension: Secondary | ICD-10-CM | POA: Diagnosis present

## 2018-12-07 DIAGNOSIS — I160 Hypertensive urgency: Principal | ICD-10-CM | POA: Diagnosis present

## 2018-12-07 DIAGNOSIS — R509 Fever, unspecified: Secondary | ICD-10-CM | POA: Diagnosis not present

## 2018-12-07 DIAGNOSIS — R17 Unspecified jaundice: Secondary | ICD-10-CM | POA: Diagnosis not present

## 2018-12-07 DIAGNOSIS — E785 Hyperlipidemia, unspecified: Secondary | ICD-10-CM | POA: Diagnosis present

## 2018-12-07 DIAGNOSIS — Z03818 Encounter for observation for suspected exposure to other biological agents ruled out: Secondary | ICD-10-CM | POA: Diagnosis not present

## 2018-12-07 DIAGNOSIS — N179 Acute kidney failure, unspecified: Secondary | ICD-10-CM | POA: Diagnosis not present

## 2018-12-07 DIAGNOSIS — K920 Hematemesis: Secondary | ICD-10-CM

## 2018-12-07 DIAGNOSIS — Z6831 Body mass index (BMI) 31.0-31.9, adult: Secondary | ICD-10-CM | POA: Diagnosis not present

## 2018-12-07 DIAGNOSIS — Z9861 Coronary angioplasty status: Secondary | ICD-10-CM

## 2018-12-07 DIAGNOSIS — N390 Urinary tract infection, site not specified: Secondary | ICD-10-CM | POA: Diagnosis not present

## 2018-12-07 DIAGNOSIS — K648 Other hemorrhoids: Secondary | ICD-10-CM | POA: Diagnosis present

## 2018-12-07 DIAGNOSIS — K219 Gastro-esophageal reflux disease without esophagitis: Secondary | ICD-10-CM | POA: Diagnosis present

## 2018-12-07 DIAGNOSIS — R4182 Altered mental status, unspecified: Secondary | ICD-10-CM | POA: Diagnosis not present

## 2018-12-07 DIAGNOSIS — J690 Pneumonitis due to inhalation of food and vomit: Secondary | ICD-10-CM | POA: Diagnosis not present

## 2018-12-07 DIAGNOSIS — Z7984 Long term (current) use of oral hypoglycemic drugs: Secondary | ICD-10-CM

## 2018-12-07 DIAGNOSIS — Z20828 Contact with and (suspected) exposure to other viral communicable diseases: Secondary | ICD-10-CM | POA: Diagnosis present

## 2018-12-07 DIAGNOSIS — Z888 Allergy status to other drugs, medicaments and biological substances status: Secondary | ICD-10-CM

## 2018-12-07 DIAGNOSIS — R457 State of emotional shock and stress, unspecified: Secondary | ICD-10-CM | POA: Diagnosis not present

## 2018-12-07 LAB — CBC WITH DIFFERENTIAL/PLATELET
Abs Immature Granulocytes: 0.04 10*3/uL (ref 0.00–0.07)
Basophils Absolute: 0.2 10*3/uL — ABNORMAL HIGH (ref 0.0–0.1)
Basophils Relative: 1 %
Eosinophils Absolute: 0.3 10*3/uL (ref 0.0–0.5)
Eosinophils Relative: 2 %
HCT: 38.4 % (ref 36.0–46.0)
Hemoglobin: 12.8 g/dL (ref 12.0–15.0)
Immature Granulocytes: 0 %
Lymphocytes Relative: 10 %
Lymphs Abs: 1.4 10*3/uL (ref 0.7–4.0)
MCH: 31.7 pg (ref 26.0–34.0)
MCHC: 33.3 g/dL (ref 30.0–36.0)
MCV: 95 fL (ref 80.0–100.0)
Monocytes Absolute: 0.9 10*3/uL (ref 0.1–1.0)
Monocytes Relative: 7 %
Neutro Abs: 10.9 10*3/uL — ABNORMAL HIGH (ref 1.7–7.7)
Neutrophils Relative %: 80 %
Platelets: 350 10*3/uL (ref 150–400)
RBC: 4.04 MIL/uL (ref 3.87–5.11)
RDW: 13.2 % (ref 11.5–15.5)
WBC: 13.8 10*3/uL — ABNORMAL HIGH (ref 4.0–10.5)
nRBC: 0 % (ref 0.0–0.2)

## 2018-12-07 LAB — URINALYSIS, ROUTINE W REFLEX MICROSCOPIC
Bacteria, UA: NONE SEEN
Bilirubin Urine: NEGATIVE
Glucose, UA: >=500 mg/dL — AB
Hgb urine dipstick: NEGATIVE
Ketones, ur: NEGATIVE mg/dL
Leukocytes,Ua: NEGATIVE
Nitrite: NEGATIVE
Protein, ur: NEGATIVE mg/dL
Specific Gravity, Urine: 1.024 (ref 1.005–1.030)
pH: 7 (ref 5.0–8.0)

## 2018-12-07 LAB — ABO/RH: ABO/RH(D): O NEG

## 2018-12-07 LAB — COMPREHENSIVE METABOLIC PANEL
ALT: 9 U/L (ref 0–44)
AST: 13 U/L — ABNORMAL LOW (ref 15–41)
Albumin: 3.4 g/dL — ABNORMAL LOW (ref 3.5–5.0)
Alkaline Phosphatase: 80 U/L (ref 38–126)
Anion gap: 12 (ref 5–15)
BUN: 16 mg/dL (ref 8–23)
CO2: 26 mmol/L (ref 22–32)
Calcium: 9 mg/dL (ref 8.9–10.3)
Chloride: 98 mmol/L (ref 98–111)
Creatinine, Ser: 1.1 mg/dL — ABNORMAL HIGH (ref 0.44–1.00)
GFR calc Af Amer: 54 mL/min — ABNORMAL LOW (ref >60)
GFR calc non Af Amer: 47 mL/min — ABNORMAL LOW (ref >60)
Glucose, Bld: 323 mg/dL — ABNORMAL HIGH (ref 70–99)
Potassium: 4 mmol/L (ref 3.5–5.1)
Sodium: 136 mmol/L (ref 135–145)
Total Bilirubin: 1 mg/dL (ref 0.3–1.2)
Total Protein: 6.5 g/dL (ref 6.5–8.1)

## 2018-12-07 LAB — GLUCOSE, CAPILLARY: Glucose-Capillary: 272 mg/dL — ABNORMAL HIGH (ref 70–99)

## 2018-12-07 LAB — TROPONIN I: Troponin I: <0.03 ng/mL (ref <0.03)

## 2018-12-07 LAB — HEMOGLOBIN AND HEMATOCRIT, BLOOD
HCT: 38.4 % (ref 36.0–46.0)
Hemoglobin: 12.8 g/dL (ref 12.0–15.0)

## 2018-12-07 LAB — SARS CORONAVIRUS 2 BY RT PCR (HOSPITAL ORDER, PERFORMED IN ~~LOC~~ HOSPITAL LAB): SARS Coronavirus 2: NEGATIVE

## 2018-12-07 MED ORDER — ONDANSETRON HCL 4 MG/2ML IJ SOLN: 4.0000 mg | Freq: Four times a day (QID) | INTRAMUSCULAR | Status: DC | PRN

## 2018-12-07 MED ORDER — HYDRALAZINE HCL 20 MG/ML IJ SOLN
10.0000 mg | Freq: Four times a day (QID) | INTRAMUSCULAR | Status: DC | PRN
  Administered 2018-12-07 (×2): 10 mg via INTRAVENOUS
  Filled 2018-12-07 (×2): qty 1

## 2018-12-07 MED ORDER — HYDRALAZINE HCL 20 MG/ML IJ SOLN
5.0000 mg | Freq: Once | INTRAMUSCULAR | Status: AC
  Administered 2018-12-07: 5 mg via INTRAVENOUS
  Filled 2018-12-07: qty 1

## 2018-12-07 MED ORDER — TOPIRAMATE 25 MG PO TABS: 25.0000 mg | ORAL_TABLET | Freq: Two times a day (BID) | ORAL | Status: DC

## 2018-12-07 MED ORDER — ESCITALOPRAM OXALATE 10 MG PO TABS: 10.0000 mg | ORAL_TABLET | Freq: Every day | ORAL | Status: DC

## 2018-12-07 MED ORDER — HYDRALAZINE HCL 20 MG/ML IJ SOLN
10.0000 mg | Freq: Four times a day (QID) | INTRAMUSCULAR | Status: DC
  Administered 2018-12-07 – 2018-12-08 (×3): 10 mg via INTRAVENOUS
  Filled 2018-12-07 (×3): qty 1

## 2018-12-07 MED ORDER — LACTATED RINGERS IV SOLN
INTRAVENOUS | Status: DC
  Administered 2018-12-07 – 2018-12-10 (×2): via INTRAVENOUS

## 2018-12-07 MED ORDER — PANTOPRAZOLE SODIUM 40 MG IV SOLR
40.0000 mg | Freq: Two times a day (BID) | INTRAVENOUS | Status: DC
  Administered 2018-12-07 – 2018-12-08 (×4): 40 mg via INTRAVENOUS
  Filled 2018-12-07 (×5): qty 40

## 2018-12-07 MED ORDER — SIMVASTATIN 20 MG PO TABS: 20.0000 mg | ORAL_TABLET | Freq: Every day | ORAL | Status: DC

## 2018-12-07 MED ORDER — LABETALOL HCL 5 MG/ML IV SOLN
10.0000 mg | Freq: Once | INTRAVENOUS | Status: AC
  Administered 2018-12-07: 10 mg via INTRAVENOUS
  Filled 2018-12-07: qty 4

## 2018-12-07 MED ORDER — METOPROLOL TARTRATE 50 MG PO TABS: 50.0000 mg | ORAL_TABLET | Freq: Two times a day (BID) | ORAL | Status: DC

## 2018-12-07 MED ORDER — MORPHINE SULFATE (PF) 4 MG/ML IV SOLN
4.0000 mg | Freq: Once | INTRAVENOUS | Status: AC
  Administered 2018-12-07: 4 mg via INTRAVENOUS
  Filled 2018-12-07: qty 1

## 2018-12-07 MED ORDER — MORPHINE SULFATE (PF) 4 MG/ML IV SOLN
4.0000 mg | Freq: Once | INTRAVENOUS | Status: AC
  Administered 2018-12-07: 4 mg via INTRAVENOUS

## 2018-12-07 MED ORDER — LORAZEPAM 2 MG/ML IJ SOLN
1.0000 mg | Freq: Four times a day (QID) | INTRAMUSCULAR | Status: DC | PRN
  Administered 2018-12-07 (×2): 1 mg via INTRAVENOUS
  Filled 2018-12-07 (×2): qty 1

## 2018-12-07 MED ORDER — CLOPIDOGREL BISULFATE 75 MG PO TABS: 75.0000 mg | ORAL_TABLET | Freq: Every day | ORAL | Status: DC

## 2018-12-07 MED ORDER — IOHEXOL 350 MG/ML SOLN
100.0000 mL | Freq: Once | INTRAVENOUS | Status: AC | PRN
  Administered 2018-12-07: 100 mL via INTRAVENOUS

## 2018-12-07 MED ORDER — AMLODIPINE BESYLATE 5 MG PO TABS: 5.0000 mg | ORAL_TABLET | Freq: Every day | ORAL | Status: DC

## 2018-12-07 MED ORDER — HYDRALAZINE HCL 20 MG/ML IJ SOLN
20.0000 mg | Freq: Four times a day (QID) | INTRAMUSCULAR | Status: DC
  Administered 2018-12-07: 20 mg via INTRAVENOUS
  Filled 2018-12-07: qty 1

## 2018-12-07 MED ORDER — ISOSORBIDE MONONITRATE ER 60 MG PO TB24: 30.0000 mg | ORAL_TABLET | Freq: Every day | ORAL | Status: DC

## 2018-12-07 MED ORDER — METOPROLOL TARTRATE 5 MG/5ML IV SOLN
5.0000 mg | Freq: Four times a day (QID) | INTRAVENOUS | Status: DC
  Administered 2018-12-07 (×2): 5 mg via INTRAVENOUS
  Filled 2018-12-07 (×2): qty 5

## 2018-12-07 MED ORDER — LORAZEPAM 2 MG/ML IJ SOLN
0.5000 mg | Freq: Once | INTRAMUSCULAR | Status: AC
  Administered 2018-12-07: 0.5 mg via INTRAVENOUS
  Filled 2018-12-07: qty 1

## 2018-12-07 MED ORDER — MORPHINE SULFATE (PF) 4 MG/ML IV SOLN
INTRAVENOUS | Status: AC
  Administered 2018-12-07: 4 mg via INTRAVENOUS
  Filled 2018-12-07: qty 1

## 2018-12-07 MED ORDER — METOPROLOL TARTRATE 5 MG/5ML IV SOLN
10.0000 mg | Freq: Four times a day (QID) | INTRAVENOUS | Status: DC
  Administered 2018-12-08 (×2): 10 mg via INTRAVENOUS
  Filled 2018-12-07 (×2): qty 10

## 2018-12-07 MED ORDER — METFORMIN HCL 500 MG PO TABS: 1000.0000 mg | ORAL_TABLET | Freq: Two times a day (BID) | ORAL | Status: DC

## 2018-12-07 MED ORDER — IRBESARTAN 150 MG PO TABS: 300.0000 mg | ORAL_TABLET | Freq: Every day | ORAL | Status: DC

## 2018-12-07 MED ORDER — INSULIN ASPART 100 UNIT/ML ~~LOC~~ SOLN
0.0000 [IU] | SUBCUTANEOUS | Status: DC
  Administered 2018-12-07: 8 [IU] via SUBCUTANEOUS
  Administered 2018-12-08: 3 [IU] via SUBCUTANEOUS
  Administered 2018-12-08: 2 [IU] via SUBCUTANEOUS
  Administered 2018-12-08: 3 [IU] via SUBCUTANEOUS
  Administered 2018-12-08 (×2): 2 [IU] via SUBCUTANEOUS
  Administered 2018-12-09: 3 [IU] via SUBCUTANEOUS
  Administered 2018-12-09: 2 [IU] via SUBCUTANEOUS
  Administered 2018-12-09 (×2): 3 [IU] via SUBCUTANEOUS

## 2018-12-07 MED ORDER — ACETAMINOPHEN 650 MG RE SUPP
650.0000 mg | Freq: Four times a day (QID) | RECTAL | Status: DC | PRN
  Administered 2018-12-07: 650 mg via RECTAL
  Filled 2018-12-07: qty 1

## 2018-12-07 MED ORDER — PIPERACILLIN-TAZOBACTAM 3.375 G IVPB
3.3750 g | Freq: Three times a day (TID) | INTRAVENOUS | Status: DC
  Administered 2018-12-07 – 2018-12-10 (×10): 3.375 g via INTRAVENOUS
  Filled 2018-12-07 (×10): qty 50

## 2018-12-07 MED ORDER — GLIMEPIRIDE 2 MG PO TABS: 4.0000 mg | ORAL_TABLET | Freq: Two times a day (BID) | ORAL | Status: DC

## 2018-12-07 MED ORDER — PANTOPRAZOLE SODIUM 40 MG PO TBEC: 40.0000 mg | DELAYED_RELEASE_TABLET | Freq: Every day | ORAL | Status: DC

## 2018-12-07 NOTE — Consult Note (Addendum)
Referring Provider: Dr. Sherryll Burger Primary Care Physician:  Elfredia Nevins, MD Primary Gastroenterologist:  Dr. Jena Gauss   Date of Admission: 12/07/18 Date of Consultation: 12/07/18  Reason for Consultation:  Hematemesis  HPI:  [name] Mueller is an 84 y.o. year old female who presented to the ED overnight via EMS due to acute onset of headache with associated confusion. Admitted for hypertensive urgency. CT angio neck with/withou contrast, CT angio head with/without contrast, and CT head without contrast completed on arrival without acute findings. Lives at home with grandson and husband. At time of consultation, patient had received Ativan due to agitation. She was unable to provide history and would not respond to verbal stimuli. Moaning intermittently in bed. Discussed with nursing staff, who state patient has been confused since admission. I contacted the grandson, Vaniah Chambers, at [phone]. GI consulted due to episodes of hematemesis. Tachycardic in the 1teens, 120s at time of consultation.   Per nursing staff, patient was found with large amount of dark emesis this morning and associated clots. Undigested food in emesis. Prior evidence of emesis in ED found by nursing staff. No melena. Admitting Hgb 12.8. Discussed with grandson, who states patient is at baseline alert and oriented X 4. Acute onset of headache prior to arrival with associated confusion. Denies any NSAIDs. Notes history of chronic GERD but controlled with Nexium per grandson. No dysphagia noted. No abdominal pain. Good appetite per grandson. No prior episodes of hematemesis or overt GI bleeding. No weight loss. On Plavix as outpatient.   Last  EGD in 2008: non-critical Schatzki's ring, normal stomach, small hiatal hernia, normal duodenum. Colonoscopy pancolonic diverticula. Internal hemorrhoids.   Past Medical History:  Diagnosis Date  . Anemia   . Arterial insufficiency (HCC)    mild- 2007 LEAs  . Coronary artery disease     stent to ramus intermedius in 2004  . Diabetes mellitus without complication (HCC)   . Dyslipidemia   . History of nuclear stress test 2004   no significant ischemia   . History of pulmonary hypertension   . Hypertension   . Morbid obesity (HCC)   . NSVT (nonsustained ventricular tachycardia) (HCC)   . OSA (obstructive sleep apnea)    AHI during total sleep 10.27/hr and REM sleep 40.34/hr    Past Surgical History:  Procedure Laterality Date  . COLONOSCOPY WITH ESOPHAGOGASTRODUODENOSCOPY (EGD)  2008   Dr. Jena Gauss: colonoscopy with pancolonic diverticula, internal hemorrhoids. EGD with non-critical Schatzki's ring, normal stomach, small hiatal hernia, normal duodenum  . CORONARY ANGIOPLASTY  01/2003   3.0x61mm TAXUS DES to ramus intermedius  . TRANSTHORACIC ECHOCARDIOGRAM  09/2011   EF=>55%, mild conc LVH, normal LV systolic function; LA mild-mod dilated; mild mitral annular calcif, mild MR; mild TR & normal RV systolic pressure; trace PVR    Prior to Admission medications   Medication Sig Start Date End Date Taking? Authorizing Provider  amLODipine (NORVASC) 5 MG tablet Take 1 tablet (5 mg total) by mouth daily. 05/05/13  Yes Hilty, Lisette Abu, MD  atorvastatin (LIPITOR) 10 MG tablet Take 1 tablet by mouth daily. 11/27/18  Yes [provider]  clopidogrel (PLAVIX) 75 MG tablet Take 75 mg by mouth daily. 04/12/13  Yes [provider]  Cyanocobalamin (VITAMIN B-12 IJ) Inject as directed every 30 (thirty) days.   Yes [provider]  escitalopram (LEXAPRO) 10 MG tablet Take 10 mg by mouth daily.   Yes [provider]  esomeprazole (NEXIUM) 40 MG capsule Take 40 mg by mouth  daily before breakfast.   Yes [provider]  folic acid (FOLVITE) 1 MG tablet Take 1 mg by mouth daily.   Yes [provider]  glimepiride (AMARYL) 4 MG tablet Take 4 mg by mouth 2 (two) times daily.    Yes [provider]  ipratropium (ATROVENT) 0.02 % nebulizer  solution Take 2.5 mLs (0.5 mg total) by nebulization every 4 (four) hours as needed for wheezing or shortness of breath. 06/04/15  Yes Erick Blinks, MD  isosorbide mononitrate (IMDUR) 30 MG 24 hr tablet Take 30 mg by mouth daily.   Yes [provider]  metFORMIN (GLUCOPHAGE) 1000 MG tablet Take 1,000 mg by mouth 2 (two) times daily with a meal.    Yes [provider]  metoprolol (LOPRESSOR) 50 MG tablet Take 1 tablet (50 mg total) by mouth 2 (two) times daily. 06/04/15  Yes Erick Blinks, MD  topiramate (TOPAMAX) 25 MG tablet Take 25 mg by mouth 2 (two) times daily.   Yes [provider]  valsartan (DIOVAN) 160 MG tablet Take 160 mg by mouth daily.   Yes [provider]    Current Facility-Administered Medications  Medication Dose Route Frequency Provider Last Rate Last Dose  . hydrALAZINE (APRESOLINE) injection 20 mg  20 mg Intravenous Q6H Shah, Pratik D, DO      . lactated ringers infusion   Intravenous Continuous Maurilio Lovely D, DO 75 mL/hr at 12/07/18 1022    . LORazepam (ATIVAN) injection 1 mg  1 mg Intravenous Q6H PRN Sherryll Burger, Pratik D, DO   1 mg at 12/07/18 1015  . metoprolol tartrate (LOPRESSOR) injection 5 mg  5 mg Intravenous Q6H Shah, Pratik D, DO   5 mg at 12/07/18 1015  . ondansetron (ZOFRAN) injection 4 mg  4 mg Intravenous Q6H PRN Sherryll Burger, Pratik D, DO      . pantoprazole (PROTONIX) injection 40 mg  40 mg Intravenous Q12H Shah, Pratik D, DO   40 mg at 12/07/18 1015    Allergies as of 12/07/2018 - Review Complete 12/07/2018  Allergen Reaction Noted  . Ambien [zolpidem] Other (See Comments) 06/01/2015  . Ezetimibe Other (See Comments)   . Lisinopril Cough     Family History  Family history unknown: Yes    Social History   Socioeconomic History  . Marital status: Married    Spouse name: Not on file  . Number of children: 2  . Years of education: 27  . Highest education level: Not on file  Occupational History  . Not on file  Social  Needs  . Financial resource strain: Not on file  . Food insecurity:    Worry: Not on file    Inability: Not on file  . Transportation needs:    Medical: Not on file    Non-medical: Not on file  Tobacco Use  . Smoking status: Never Smoker  . Smokeless tobacco: Never Used  Substance and Sexual Activity  . Alcohol use: No  . Drug use: No  . Sexual activity: Not on file  Lifestyle  . Physical activity:    Days per week: Not on file    Minutes per session: Not on file  . Stress: Not on file  Relationships  . Social connections:    Talks on phone: Not on file    Gets together: Not on file    Attends religious service: Not on file    Active member of club or organization: Not on file    Attends meetings  of clubs or organizations: Not on file    Relationship status: Not on file  . Intimate partner violence:    Fear of current or ex partner: Not on file    Emotionally abused: Not on file    Physically abused: Not on file    Forced sexual activity: Not on file  Other Topics Concern  . Not on file  Social History Narrative  . Not on file    Review of Systems: Unable to obtain due to mental status.   Physical Exam: Vital signs in last 24 hours: Temp:  [97.5 F (36.4 C)-98.6 F (37 C)] 98.6 F (37 C) (05/11 0848) Pulse Rate:  [70-122] 111 (05/11 1131) Resp:  [14-25] 21 (05/11 0848) BP: (108-204)/(66-155) 155/89 (05/11 1131) SpO2:  [92 %-100 %] 97 % (05/11 0848) Weight:  [86.8 kg-97.1 kg] 86.8 kg (05/11 0848) Last BM Date: (UTA) General:   Laying in bed with eyes closed, does not open to verbal stimuli, winces eyes when adjusting patient in bed Eyes:  Sclera clear, no icterus. Lungs:  Clear throughout to auscultation.    Heart:  S1 S2 present, tacyhcardic Abdomen:  Soft, obese, +BS, nondistended. Some discomfort periumbilically with patient grimacing during palpation initially but then without any further discomfort despite continued exam. Rectal:  Deferred  Msk:   Symmetrical without gross deformities.  Extremities:  Right dorsal aspect of hand with bruise, edema, no lower extremity edema Neurologic:  Unable to assess orientation   Intake/Output from previous day: No intake/output data recorded. Intake/Output this shift: No intake/output data recorded.  Lab Results: Recent Labs    12/07/18 0149  WBC 13.8*  HGB 12.8  HCT 38.4  PLT 350   BMET Recent Labs    12/07/18 0149  NA 136  K 4.0  CL 98  CO2 26  GLUCOSE 323*  BUN 16  CREATININE 1.10*  CALCIUM 9.0   LFT Recent Labs    12/07/18 0149  PROT 6.5  ALBUMIN 3.4*  AST 13*  ALT 9  ALKPHOS 80  BILITOT 1.0    Studies/Results: Ct Angio Head W Or Wo Contrast  Result Date: 12/07/2018 CLINICAL DATA:  Altered mental status. Severe headache. Hypertension. EXAM: CT HEAD WITHOUT CONTRAST CT ANGIOGRAPHY OF THE HEAD AND NECK TECHNIQUE: Contiguous axial images were obtained from the base of the skull through the vertex without intravenous contrast. Multidetector CT imaging of the head and neck was performed using the standard protocol during bolus administration of intravenous contrast. Multiplanar CT image reconstructions and MIPs were obtained to evaluate the vascular anatomy. Carotid stenosis measurements (when applicable) are obtained utilizing NASCET criteria, using the distal internal carotid diameter as the denominator. CONTRAST:  OMNIPAQUE IOHEXOL 350 MG/ML SOLN COMPARISON:  Chest CT 05/30/2015 FINDINGS: CT HEAD FINDINGS Brain: There is no mass, hemorrhage or extra-axial collection. There is generalized atrophy without lobar predilection. There is hypoattenuation of the periventricular white matter, most commonly indicating chronic ischemic microangiopathy. Skull: The visualized skull base, calvarium and extracranial soft tissues are normal. Sinuses/Orbits: No fluid levels or advanced mucosal thickening of the visualized paranasal sinuses. No mastoid or middle ear effusion. The orbits  are normal. CTA NECK FINDINGS SKELETON: There is moderate-to-severe spinal canal stenosis at C5-6 due to bulky calcifications along the posterior longitudinal ligament. OTHER NECK: Multiple enlarged right mediastinal lymph nodes measuring up to 10 mm. There are multiple subcentimeter hypodense nodules of thyroid gland. UPPER CHEST: No pneumothorax or pleural effusion. No nodules or masses. AORTIC ARCH: There is  mild calcific atherosclerosis of the aortic arch. There is no aneurysm, dissection or hemodynamically significant stenosis of the visualized ascending aorta and aortic arch. Conventional 3 vessel aortic branching pattern. The visualized proximal subclavian arteries are widely patent. RIGHT CAROTID SYSTEM: --Common carotid artery: Widely patent origin without common carotid artery dissection or aneurysm. --Internal carotid artery: No dissection, occlusion or aneurysm. Mild atherosclerotic calcification at the carotid bifurcation without hemodynamically significant stenosis. --External carotid artery: No acute abnormality. LEFT CAROTID SYSTEM: --Common carotid artery: Widely patent origin without common carotid artery dissection or aneurysm. --Internal carotid artery: No dissection, occlusion or aneurysm. Mild atherosclerotic calcification at the carotid bifurcation without hemodynamically significant stenosis. --External carotid artery: No acute abnormality. VERTEBRAL ARTERIES: Right dominant configuration. Both origins are normal. No dissection, occlusion or flow-limiting stenosis to the skull base. CTA HEAD FINDINGS POSTERIOR CIRCULATION: --Vertebral arteries: Moderate atherosclerotic calcification of both V4 segments. --Posterior inferior cerebellar arteries (PICA): Patent origins from the vertebral arteries. --Anterior inferior cerebellar arteries (AICA): Patent origins from the basilar artery. --Basilar artery: Normal. --Superior cerebellar arteries: Normal. --Posterior cerebral arteries (PCA): Normal.  Both originate from the basilar artery. Posterior communicating arteries (p-comm) are diminutive or absent. ANTERIOR CIRCULATION: --Intracranial internal carotid arteries: Atherosclerotic calcification of the internal carotid arteries at the skull base without hemodynamically significant stenosis. --Anterior cerebral arteries (ACA): Normal. Hypoplastic right A1 segment, normal variant. --Middle cerebral arteries (MCA): Normal. VENOUS SINUSES: As permitted by contrast timing, patent. ANATOMIC VARIANTS: None Review of the MIP images confirms the above findings. IMPRESSION: 1. No emergent large vessel occlusion or high-grade stenosis. 2. Upper mediastinal lymphadenopathy, unchanged compared to 05/30/2015. 3. Moderate-to-severe spinal canal stenosis at C5-6 due to posterior longitudinal ligament calcification. Electronically Signed   By: Deatra Robinson M.D.   On: 12/07/2018 03:08   Ct Head Wo Contrast  Result Date: 12/07/2018 CLINICAL DATA:  Altered mental status. Severe headache. Hypertension. EXAM: CT HEAD WITHOUT CONTRAST CT ANGIOGRAPHY OF THE HEAD AND NECK TECHNIQUE: Contiguous axial images were obtained from the base of the skull through the vertex without intravenous contrast. Multidetector CT imaging of the head and neck was performed using the standard protocol during bolus administration of intravenous contrast. Multiplanar CT image reconstructions and MIPs were obtained to evaluate the vascular anatomy. Carotid stenosis measurements (when applicable) are obtained utilizing NASCET criteria, using the distal internal carotid diameter as the denominator. CONTRAST:  OMNIPAQUE IOHEXOL 350 MG/ML SOLN COMPARISON:  Chest CT 05/30/2015 FINDINGS: CT HEAD FINDINGS Brain: There is no mass, hemorrhage or extra-axial collection. There is generalized atrophy without lobar predilection. There is hypoattenuation of the periventricular white matter, most commonly indicating chronic ischemic microangiopathy. Skull: The  visualized skull base, calvarium and extracranial soft tissues are normal. Sinuses/Orbits: No fluid levels or advanced mucosal thickening of the visualized paranasal sinuses. No mastoid or middle ear effusion. The orbits are normal. CTA NECK FINDINGS SKELETON: There is moderate-to-severe spinal canal stenosis at C5-6 due to bulky calcifications along the posterior longitudinal ligament. OTHER NECK: Multiple enlarged right mediastinal lymph nodes measuring up to 10 mm. There are multiple subcentimeter hypodense nodules of thyroid gland. UPPER CHEST: No pneumothorax or pleural effusion. No nodules or masses. AORTIC ARCH: There is mild calcific atherosclerosis of the aortic arch. There is no aneurysm, dissection or hemodynamically significant stenosis of the visualized ascending aorta and aortic arch. Conventional 3 vessel aortic branching pattern. The visualized proximal subclavian arteries are widely patent. RIGHT CAROTID SYSTEM: --Common carotid artery: Widely patent origin without common carotid artery dissection or aneurysm. --  Internal carotid artery: No dissection, occlusion or aneurysm. Mild atherosclerotic calcification at the carotid bifurcation without hemodynamically significant stenosis. --External carotid artery: No acute abnormality. LEFT CAROTID SYSTEM: --Common carotid artery: Widely patent origin without common carotid artery dissection or aneurysm. --Internal carotid artery: No dissection, occlusion or aneurysm. Mild atherosclerotic calcification at the carotid bifurcation without hemodynamically significant stenosis. --External carotid artery: No acute abnormality. VERTEBRAL ARTERIES: Right dominant configuration. Both origins are normal. No dissection, occlusion or flow-limiting stenosis to the skull base. CTA HEAD FINDINGS POSTERIOR CIRCULATION: --Vertebral arteries: Moderate atherosclerotic calcification of both V4 segments. --Posterior inferior cerebellar arteries (PICA): Patent origins from the  vertebral arteries. --Anterior inferior cerebellar arteries (AICA): Patent origins from the basilar artery. --Basilar artery: Normal. --Superior cerebellar arteries: Normal. --Posterior cerebral arteries (PCA): Normal. Both originate from the basilar artery. Posterior communicating arteries (p-comm) are diminutive or absent. ANTERIOR CIRCULATION: --Intracranial internal carotid arteries: Atherosclerotic calcification of the internal carotid arteries at the skull base without hemodynamically significant stenosis. --Anterior cerebral arteries (ACA): Normal. Hypoplastic right A1 segment, normal variant. --Middle cerebral arteries (MCA): Normal. VENOUS SINUSES: As permitted by contrast timing, patent. ANATOMIC VARIANTS: None Review of the MIP images confirms the above findings. IMPRESSION: 1. No emergent large vessel occlusion or high-grade stenosis. 2. Upper mediastinal lymphadenopathy, unchanged compared to 05/30/2015. 3. Moderate-to-severe spinal canal stenosis at C5-6 due to posterior longitudinal ligament calcification. Electronically Signed   By: Deatra Robinson M.D.   On: 12/07/2018 03:08   Ct Angio Neck W And/or Wo Contrast  Result Date: 12/07/2018 CLINICAL DATA:  Altered mental status. Severe headache. Hypertension. EXAM: CT HEAD WITHOUT CONTRAST CT ANGIOGRAPHY OF THE HEAD AND NECK TECHNIQUE: Contiguous axial images were obtained from the base of the skull through the vertex without intravenous contrast. Multidetector CT imaging of the head and neck was performed using the standard protocol during bolus administration of intravenous contrast. Multiplanar CT image reconstructions and MIPs were obtained to evaluate the vascular anatomy. Carotid stenosis measurements (when applicable) are obtained utilizing NASCET criteria, using the distal internal carotid diameter as the denominator. CONTRAST:  OMNIPAQUE IOHEXOL 350 MG/ML SOLN COMPARISON:  Chest CT 05/30/2015 FINDINGS: CT HEAD FINDINGS Brain: There is no  mass, hemorrhage or extra-axial collection. There is generalized atrophy without lobar predilection. There is hypoattenuation of the periventricular white matter, most commonly indicating chronic ischemic microangiopathy. Skull: The visualized skull base, calvarium and extracranial soft tissues are normal. Sinuses/Orbits: No fluid levels or advanced mucosal thickening of the visualized paranasal sinuses. No mastoid or middle ear effusion. The orbits are normal. CTA NECK FINDINGS SKELETON: There is moderate-to-severe spinal canal stenosis at C5-6 due to bulky calcifications along the posterior longitudinal ligament. OTHER NECK: Multiple enlarged right mediastinal lymph nodes measuring up to 10 mm. There are multiple subcentimeter hypodense nodules of thyroid gland. UPPER CHEST: No pneumothorax or pleural effusion. No nodules or masses. AORTIC ARCH: There is mild calcific atherosclerosis of the aortic arch. There is no aneurysm, dissection or hemodynamically significant stenosis of the visualized ascending aorta and aortic arch. Conventional 3 vessel aortic branching pattern. The visualized proximal subclavian arteries are widely patent. RIGHT CAROTID SYSTEM: --Common carotid artery: Widely patent origin without common carotid artery dissection or aneurysm. --Internal carotid artery: No dissection, occlusion or aneurysm. Mild atherosclerotic calcification at the carotid bifurcation without hemodynamically significant stenosis. --External carotid artery: No acute abnormality. LEFT CAROTID SYSTEM: --Common carotid artery: Widely patent origin without common carotid artery dissection or aneurysm. --Internal carotid artery: No dissection, occlusion or aneurysm. Mild atherosclerotic  calcification at the carotid bifurcation without hemodynamically significant stenosis. --External carotid artery: No acute abnormality. VERTEBRAL ARTERIES: Right dominant configuration. Both origins are normal. No dissection, occlusion or  flow-limiting stenosis to the skull base. CTA HEAD FINDINGS POSTERIOR CIRCULATION: --Vertebral arteries: Moderate atherosclerotic calcification of both V4 segments. --Posterior inferior cerebellar arteries (PICA): Patent origins from the vertebral arteries. --Anterior inferior cerebellar arteries (AICA): Patent origins from the basilar artery. --Basilar artery: Normal. --Superior cerebellar arteries: Normal. --Posterior cerebral arteries (PCA): Normal. Both originate from the basilar artery. Posterior communicating arteries (p-comm) are diminutive or absent. ANTERIOR CIRCULATION: --Intracranial internal carotid arteries: Atherosclerotic calcification of the internal carotid arteries at the skull base without hemodynamically significant stenosis. --Anterior cerebral arteries (ACA): Normal. Hypoplastic right A1 segment, normal variant. --Middle cerebral arteries (MCA): Normal. VENOUS SINUSES: As permitted by contrast timing, patent. ANATOMIC VARIANTS: None Review of the MIP images confirms the above findings. IMPRESSION: 1. No emergent large vessel occlusion or high-grade stenosis. 2. Upper mediastinal lymphadenopathy, unchanged compared to 05/30/2015. 3. Moderate-to-severe spinal canal stenosis at C5-6 due to posterior longitudinal ligament calcification. Electronically Signed   By: Deatra RobinsonKevin  Herman M.D.   On: 12/07/2018 03:08    Impression: 84 year old female admitted with hypertensive urgency, acute headache, changes in mental status. Now found with evidence of hematemesis on two occasions this morning (ED and unit 300). No melena. Admitting Hgb 12.8. Unable to obtain history from patient due to mental status changes. As of note, grandson states this is an acute change from baseline, with confusion starting at onset of headache. Imaging without acute findings as noted above. No known regular use of NSAIDs per grandson. She does take Plavix as an outpatient. No concerning lower or upper GI signs/symptoms as  outpatient prior to admission per grandson.  Currently, she is tachycardic in the 1teens, recently agitated and receiving Ativan. SBP in 200 range on admission, now improved to most recently 155/89.   Agree with PPI IV BID. Remain NPO. Will need diagnostic EGD once clinically stable. To discuss with Dr. Jena Gaussourk best timing of this.     Plan: Remain NPO Continue IV PPI BID Monitor for acute overt GI bleeding Transfuse as needed Follow H/H: appears repeat has been ordered but not completed Diagnostic EGD when clinically stable. Briefly discussed with grandson. Will need to obtain his consent via phone as patient is unable to give consent herself at this time. Will continue to follow with you  Gelene MinkAnna W. Boone, PhD, ANP-BC [facility]    LOS: 0 days    12/07/2018, 11:32 AM   Attending note: Agree with above assessment and recommendations.  Tentatively plan for EGD tomorrow.  We will reassess mental status first thing tomorrow morning.

## 2018-12-07 NOTE — ED Provider Notes (Signed)
[facility] EMERGENCY DEPARTMENT Provider Note   CSN: [id number] Arrival date & time: 12/07/18  0122    History   Chief Complaint Chief Complaint  Patient presents with   Hypertension    HPI [name] Mueller is a 84 y.o. female.     Patient sent to the emergency department because of a headache and poorly controlled blood pressure.  The history is provided by the patient. No language interpreter was used.  Hypertension  This is a recurrent problem. Episode onset: Unknown. The problem occurs constantly. The problem has not changed since onset.Associated symptoms include headaches. Pertinent negatives include no chest pain and no abdominal pain. Nothing aggravates the symptoms. Nothing relieves the symptoms. She has tried nothing for the symptoms. The treatment provided no relief.    Past Medical History:  Diagnosis Date   Anemia    Arterial insufficiency (HCC)    mild- 2007 LEAs   Coronary artery disease    stent to ramus intermedius in 2004   Diabetes mellitus without complication (HCC)    Dyslipidemia    History of nuclear stress test 2004   no significant ischemia    History of pulmonary hypertension    Hypertension    Morbid obesity (HCC)    NSVT (nonsustained ventricular tachycardia) (HCC)    OSA (obstructive sleep apnea)    AHI during total sleep 10.27/hr and REM sleep 40.34/hr    Patient Active Problem List   Diagnosis Date Noted   Respiratory failure with hypoxia (HCC) 05/30/2015   Acute respiratory failure with hypoxia (HCC) 05/30/2015   CAP (community acquired pneumonia) 05/30/2015   Syncope 05/30/2015   Metabolic encephalopathy 05/30/2015   Diabetes mellitus without complication (HCC)    HYPERTENSION 05/03/2010   Morbid obesity (HCC) 05/02/2010   ANEMIA 05/02/2010   Obstructive sleep apnea 05/02/2010   CAD (coronary artery disease) 05/02/2010   VENTRICULAR TACHYCARDIA 05/02/2010    Past Surgical History:  Procedure  Laterality Date   CORONARY ANGIOPLASTY  01/2003   3.0x42mm TAXUS DES to ramus intermedius   TRANSTHORACIC ECHOCARDIOGRAM  09/2011   EF=>55%, mild conc LVH, normal LV systolic function; LA mild-mod dilated; mild mitral annular calcif, mild MR; mild TR & normal RV systolic pressure; trace PVR     OB History   No obstetric history on file.      Home Medications    Prior to Admission medications   Medication Sig Start Date End Date Taking? Authorizing Provider  amLODipine (NORVASC) 5 MG tablet Take 1 tablet (5 mg total) by mouth daily. 05/05/13   Hilty, Lisette Abu, MD  clopidogrel (PLAVIX) 75 MG tablet Take 75 mg by mouth daily. 04/12/13   [provider]  Cyanocobalamin (VITAMIN B-12 IJ) Inject as directed every 30 (thirty) days.    [provider]  escitalopram (LEXAPRO) 10 MG tablet Take 10 mg by mouth daily.    [provider]  esomeprazole (NEXIUM) 40 MG capsule Take 40 mg by mouth daily before breakfast.    [provider]  folic acid (FOLVITE) 1 MG tablet Take 1 mg by mouth daily.    [provider]  glimepiride (AMARYL) 4 MG tablet Take 4 mg by mouth 2 (two) times daily.     [provider]  ipratropium (ATROVENT) 0.02 % nebulizer solution Take 2.5 mLs (0.5 mg total) by nebulization every 4 (four) hours as needed for wheezing or shortness of breath. 06/04/15   Erick Blinks, MD  isosorbide mononitrate (IMDUR) 30 MG 24 hr  tablet Take 30 mg by mouth daily.    [provider]  metFORMIN (GLUCOPHAGE) 1000 MG tablet Take 1,000 mg by mouth 2 (two) times daily with a meal.     [provider]  metoprolol (LOPRESSOR) 50 MG tablet Take 1 tablet (50 mg total) by mouth 2 (two) times daily. 06/04/15   Erick Blinks, MD  simvastatin (ZOCOR) 20 MG tablet Take 20 mg by mouth daily.     [provider]  topiramate (TOPAMAX) 25 MG tablet Take 25 mg by mouth 2 (two) times daily.    [provider]  valsartan  (DIOVAN) 160 MG tablet Take 160 mg by mouth daily.    [provider]    Family History Family History  Family history unknown: Yes    Social History Social History   Tobacco Use   Smoking status: Never Smoker   Smokeless tobacco: Never Used  Substance Use Topics   Alcohol use: No   Drug use: No     Allergies   Ambien [zolpidem]; Ezetimibe; and Lisinopril   Review of Systems Review of Systems  Constitutional: Negative for appetite change and fatigue.  HENT: Negative for congestion, ear discharge and sinus pressure.   Eyes: Negative for discharge.  Respiratory: Negative for cough.   Cardiovascular: Negative for chest pain.  Gastrointestinal: Negative for abdominal pain and diarrhea.  Genitourinary: Negative for frequency and hematuria.  Musculoskeletal: Negative for back pain.  Skin: Negative for rash.  Neurological: Positive for headaches. Negative for seizures.  Psychiatric/Behavioral: Negative for hallucinations.     Physical Exam Updated Vital Signs BP (!) 199/141    Pulse (!) 106    Temp (!) 97.5 F (36.4 C) (Oral)    Resp (!) 21    Ht  (1.753 m)    Wt 97.1 kg    SpO2 92%    BMI 31.61 kg/m   Physical Exam Vitals signs and nursing note reviewed.  Constitutional:      Appearance: She is well-developed.  HENT:     Head: Normocephalic.     Nose: Nose normal.  Eyes:     General: No scleral icterus.    Conjunctiva/sclera: Conjunctivae normal.  Neck:     Musculoskeletal: Neck supple.     Thyroid: No thyromegaly.  Cardiovascular:     Rate and Rhythm: Normal rate and regular rhythm.     Heart sounds: No murmur. No friction rub. No gallop.   Pulmonary:     Breath sounds: No stridor. No wheezing or rales.  Chest:     Chest wall: No tenderness.  Abdominal:     General: There is no distension.     Tenderness: There is no abdominal tenderness. There is no rebound.  Musculoskeletal: Normal range of motion.  Lymphadenopathy:     Cervical:  No cervical adenopathy.  Skin:    Findings: No erythema or rash.  Neurological:     Motor: No abnormal muscle tone.     Coordination: Coordination normal.     Comments: Patient mildly confused  Psychiatric:        Behavior: Behavior normal.      ED Treatments / Results  Labs (all labs ordered are listed, but only abnormal results are displayed) Labs Reviewed  CBC WITH DIFFERENTIAL/PLATELET - Abnormal; Notable for the following components:      Result Value   WBC 13.8 (*)    Neutro Abs 10.9 (*)    Basophils Absolute 0.2 (*)    All other  components within normal limits  COMPREHENSIVE METABOLIC PANEL - Abnormal; Notable for the following components:   Glucose, Bld 323 (*)    Creatinine, Ser 1.10 (*)    Albumin 3.4 (*)    AST 13 (*)    GFR calc non Af Amer 47 (*)    GFR calc Af Amer 54 (*)    All other components within normal limits  URINE CULTURE  URINALYSIS, ROUTINE W REFLEX MICROSCOPIC    EKG EKG Interpretation  Date/Time:  Monday Dec 07 2018 01:36:28 EDT Ventricular Rate:  83 PR Interval:    QRS Duration: 98 QT Interval:  390 QTC Calculation: 459 R Axis:   58 Text Interpretation:  Sinus rhythm Confirmed by Bethann Berkshire [phone]) on 12/07/2018 4:39:26 AM   Radiology Ct Angio Head W Or Wo Contrast  Result Date: 12/07/2018 CLINICAL DATA:  Altered mental status. Severe headache. Hypertension. EXAM: CT HEAD WITHOUT CONTRAST CT ANGIOGRAPHY OF THE HEAD AND NECK TECHNIQUE: Contiguous axial images were obtained from the base of the skull through the vertex without intravenous contrast. Multidetector CT imaging of the head and neck was performed using the standard protocol during bolus administration of intravenous contrast. Multiplanar CT image reconstructions and MIPs were obtained to evaluate the vascular anatomy. Carotid stenosis measurements (when applicable) are obtained utilizing NASCET criteria, using the distal internal carotid diameter as the denominator. CONTRAST:   OMNIPAQUE IOHEXOL 350 MG/ML SOLN COMPARISON:  Chest CT 05/30/2015 FINDINGS: CT HEAD FINDINGS Brain: There is no mass, hemorrhage or extra-axial collection. There is generalized atrophy without lobar predilection. There is hypoattenuation of the periventricular white matter, most commonly indicating chronic ischemic microangiopathy. Skull: The visualized skull base, calvarium and extracranial soft tissues are normal. Sinuses/Orbits: No fluid levels or advanced mucosal thickening of the visualized paranasal sinuses. No mastoid or middle ear effusion. The orbits are normal. CTA NECK FINDINGS SKELETON: There is moderate-to-severe spinal canal stenosis at C5-6 due to bulky calcifications along the posterior longitudinal ligament. OTHER NECK: Multiple enlarged right mediastinal lymph nodes measuring up to 10 mm. There are multiple subcentimeter hypodense nodules of thyroid gland. UPPER CHEST: No pneumothorax or pleural effusion. No nodules or masses. AORTIC ARCH: There is mild calcific atherosclerosis of the aortic arch. There is no aneurysm, dissection or hemodynamically significant stenosis of the visualized ascending aorta and aortic arch. Conventional 3 vessel aortic branching pattern. The visualized proximal subclavian arteries are widely patent. RIGHT CAROTID SYSTEM: --Common carotid artery: Widely patent origin without common carotid artery dissection or aneurysm. --Internal carotid artery: No dissection, occlusion or aneurysm. Mild atherosclerotic calcification at the carotid bifurcation without hemodynamically significant stenosis. --External carotid artery: No acute abnormality. LEFT CAROTID SYSTEM: --Common carotid artery: Widely patent origin without common carotid artery dissection or aneurysm. --Internal carotid artery: No dissection, occlusion or aneurysm. Mild atherosclerotic calcification at the carotid bifurcation without hemodynamically significant stenosis. --External carotid artery: No acute  abnormality. VERTEBRAL ARTERIES: Right dominant configuration. Both origins are normal. No dissection, occlusion or flow-limiting stenosis to the skull base. CTA HEAD FINDINGS POSTERIOR CIRCULATION: --Vertebral arteries: Moderate atherosclerotic calcification of both V4 segments. --Posterior inferior cerebellar arteries (PICA): Patent origins from the vertebral arteries. --Anterior inferior cerebellar arteries (AICA): Patent origins from the basilar artery. --Basilar artery: Normal. --Superior cerebellar arteries: Normal. --Posterior cerebral arteries (PCA): Normal. Both originate from the basilar artery. Posterior communicating arteries (p-comm) are diminutive or absent. ANTERIOR CIRCULATION: --Intracranial internal carotid arteries: Atherosclerotic calcification of the internal carotid arteries at the skull base without hemodynamically significant stenosis. --Anterior  cerebral arteries (ACA): Normal. Hypoplastic right A1 segment, normal variant. --Middle cerebral arteries (MCA): Normal. VENOUS SINUSES: As permitted by contrast timing, patent. ANATOMIC VARIANTS: None Review of the MIP images confirms the above findings. IMPRESSION: 1. No emergent large vessel occlusion or high-grade stenosis. 2. Upper mediastinal lymphadenopathy, unchanged compared to 05/30/2015. 3. Moderate-to-severe spinal canal stenosis at C5-6 due to posterior longitudinal ligament calcification. Electronically Signed   By: Deatra Robinson M.D.   On: 12/07/2018 03:08   Ct Head Wo Contrast  Result Date: 12/07/2018 CLINICAL DATA:  Altered mental status. Severe headache. Hypertension. EXAM: CT HEAD WITHOUT CONTRAST CT ANGIOGRAPHY OF THE HEAD AND NECK TECHNIQUE: Contiguous axial images were obtained from the base of the skull through the vertex without intravenous contrast. Multidetector CT imaging of the head and neck was performed using the standard protocol during bolus administration of intravenous contrast. Multiplanar CT image  reconstructions and MIPs were obtained to evaluate the vascular anatomy. Carotid stenosis measurements (when applicable) are obtained utilizing NASCET criteria, using the distal internal carotid diameter as the denominator. CONTRAST:  OMNIPAQUE IOHEXOL 350 MG/ML SOLN COMPARISON:  Chest CT 05/30/2015 FINDINGS: CT HEAD FINDINGS Brain: There is no mass, hemorrhage or extra-axial collection. There is generalized atrophy without lobar predilection. There is hypoattenuation of the periventricular white matter, most commonly indicating chronic ischemic microangiopathy. Skull: The visualized skull base, calvarium and extracranial soft tissues are normal. Sinuses/Orbits: No fluid levels or advanced mucosal thickening of the visualized paranasal sinuses. No mastoid or middle ear effusion. The orbits are normal. CTA NECK FINDINGS SKELETON: There is moderate-to-severe spinal canal stenosis at C5-6 due to bulky calcifications along the posterior longitudinal ligament. OTHER NECK: Multiple enlarged right mediastinal lymph nodes measuring up to 10 mm. There are multiple subcentimeter hypodense nodules of thyroid gland. UPPER CHEST: No pneumothorax or pleural effusion. No nodules or masses. AORTIC ARCH: There is mild calcific atherosclerosis of the aortic arch. There is no aneurysm, dissection or hemodynamically significant stenosis of the visualized ascending aorta and aortic arch. Conventional 3 vessel aortic branching pattern. The visualized proximal subclavian arteries are widely patent. RIGHT CAROTID SYSTEM: --Common carotid artery: Widely patent origin without common carotid artery dissection or aneurysm. --Internal carotid artery: No dissection, occlusion or aneurysm. Mild atherosclerotic calcification at the carotid bifurcation without hemodynamically significant stenosis. --External carotid artery: No acute abnormality. LEFT CAROTID SYSTEM: --Common carotid artery: Widely patent origin without common carotid artery  dissection or aneurysm. --Internal carotid artery: No dissection, occlusion or aneurysm. Mild atherosclerotic calcification at the carotid bifurcation without hemodynamically significant stenosis. --External carotid artery: No acute abnormality. VERTEBRAL ARTERIES: Right dominant configuration. Both origins are normal. No dissection, occlusion or flow-limiting stenosis to the skull base. CTA HEAD FINDINGS POSTERIOR CIRCULATION: --Vertebral arteries: Moderate atherosclerotic calcification of both V4 segments. --Posterior inferior cerebellar arteries (PICA): Patent origins from the vertebral arteries. --Anterior inferior cerebellar arteries (AICA): Patent origins from the basilar artery. --Basilar artery: Normal. --Superior cerebellar arteries: Normal. --Posterior cerebral arteries (PCA): Normal. Both originate from the basilar artery. Posterior communicating arteries (p-comm) are diminutive or absent. ANTERIOR CIRCULATION: --Intracranial internal carotid arteries: Atherosclerotic calcification of the internal carotid arteries at the skull base without hemodynamically significant stenosis. --Anterior cerebral arteries (ACA): Normal. Hypoplastic right A1 segment, normal variant. --Middle cerebral arteries (MCA): Normal. VENOUS SINUSES: As permitted by contrast timing, patent. ANATOMIC VARIANTS: None Review of the MIP images confirms the above findings. IMPRESSION: 1. No emergent large vessel occlusion or high-grade stenosis. 2. Upper mediastinal lymphadenopathy, unchanged compared to 05/30/2015. 3.  Moderate-to-severe spinal canal stenosis at C5-6 due to posterior longitudinal ligament calcification. Electronically Signed   By: Deatra RobinsonKevin  Herman M.D.   On: 12/07/2018 03:08   Ct Angio Neck W And/or Wo Contrast  Result Date: 12/07/2018 CLINICAL DATA:  Altered mental status. Severe headache. Hypertension. EXAM: CT HEAD WITHOUT CONTRAST CT ANGIOGRAPHY OF THE HEAD AND NECK TECHNIQUE: Contiguous axial images were obtained  from the base of the skull through the vertex without intravenous contrast. Multidetector CT imaging of the head and neck was performed using the standard protocol during bolus administration of intravenous contrast. Multiplanar CT image reconstructions and MIPs were obtained to evaluate the vascular anatomy. Carotid stenosis measurements (when applicable) are obtained utilizing NASCET criteria, using the distal internal carotid diameter as the denominator. CONTRAST:  100mL OMNIPAQUE IOHEXOL 350 MG/ML SOLN COMPARISON:  Chest CT 05/30/2015 FINDINGS: CT HEAD FINDINGS Brain: There is no mass, hemorrhage or extra-axial collection. There is generalized atrophy without lobar predilection. There is hypoattenuation of the periventricular white matter, most commonly indicating chronic ischemic microangiopathy. Skull: The visualized skull base, calvarium and extracranial soft tissues are normal. Sinuses/Orbits: No fluid levels or advanced mucosal thickening of the visualized paranasal sinuses. No mastoid or middle ear effusion. The orbits are normal. CTA NECK FINDINGS SKELETON: There is moderate-to-severe spinal canal stenosis at C5-6 due to bulky calcifications along the posterior longitudinal ligament. OTHER NECK: Multiple enlarged right mediastinal lymph nodes measuring up to 10 mm. There are multiple subcentimeter hypodense nodules of thyroid gland. UPPER CHEST: No pneumothorax or pleural effusion. No nodules or masses. AORTIC ARCH: There is mild calcific atherosclerosis of the aortic arch. There is no aneurysm, dissection or hemodynamically significant stenosis of the visualized ascending aorta and aortic arch. Conventional 3 vessel aortic branching pattern. The visualized proximal subclavian arteries are widely patent. RIGHT CAROTID SYSTEM: --Common carotid artery: Widely patent origin without common carotid artery dissection or aneurysm. --Internal carotid artery: No dissection, occlusion or aneurysm. Mild  atherosclerotic calcification at the carotid bifurcation without hemodynamically significant stenosis. --External carotid artery: No acute abnormality. LEFT CAROTID SYSTEM: --Common carotid artery: Widely patent origin without common carotid artery dissection or aneurysm. --Internal carotid artery: No dissection, occlusion or aneurysm. Mild atherosclerotic calcification at the carotid bifurcation without hemodynamically significant stenosis. --External carotid artery: No acute abnormality. VERTEBRAL ARTERIES: Right dominant configuration. Both origins are normal. No dissection, occlusion or flow-limiting stenosis to the skull base. CTA HEAD FINDINGS POSTERIOR CIRCULATION: --Vertebral arteries: Moderate atherosclerotic calcification of both V4 segments. --Posterior inferior cerebellar arteries (PICA): Patent origins from the vertebral arteries. --Anterior inferior cerebellar arteries (AICA): Patent origins from the basilar artery. --Basilar artery: Normal. --Superior cerebellar arteries: Normal. --Posterior cerebral arteries (PCA): Normal. Both originate from the basilar artery. Posterior communicating arteries (p-comm) are diminutive or absent. ANTERIOR CIRCULATION: --Intracranial internal carotid arteries: Atherosclerotic calcification of the internal carotid arteries at the skull base without hemodynamically significant stenosis. --Anterior cerebral arteries (ACA): Normal. Hypoplastic right A1 segment, normal variant. --Middle cerebral arteries (MCA): Normal. VENOUS SINUSES: As permitted by contrast timing, patent. ANATOMIC VARIANTS: None Review of the MIP images confirms the above findings. IMPRESSION: 1. No emergent large vessel occlusion or high-grade stenosis. 2. Upper mediastinal lymphadenopathy, unchanged compared to 05/30/2015. 3. Moderate-to-severe spinal canal stenosis at C5-6 due to posterior longitudinal ligament calcification. Electronically Signed   By: Deatra RobinsonKevin  Herman M.D.   On: 12/07/2018 03:08     Procedures Procedures (including critical care time)  Medications Ordered in ED Medications  morphine 4 MG/ML injection 4 mg (4 mg  Intravenous Given 12/07/18 0146)  hydrALAZINE (APRESOLINE) injection 5 mg (5 mg Intravenous Given 12/07/18 0147)  hydrALAZINE (APRESOLINE) injection 5 mg (5 mg Intravenous Given 12/07/18 0247)  iohexol (OMNIPAQUE) 350 MG/ML injection 100 mL (100 mLs Intravenous Contrast Given 12/07/18 0227)  morphine 4 MG/ML injection 4 mg (4 mg Intravenous Given 12/07/18 0309)  LORazepam (ATIVAN) injection 0.5 mg (0.5 mg Intravenous Given 12/07/18 0336)     Initial Impression / Assessment and Plan / ED Course  I have reviewed the triage vital signs and the nursing notes.  Pertinent labs & imaging results that were available during my care of the patient were reviewed by me and considered in my medical decision making (see chart for details).    CRITICAL CARE Performed by: Bethann Berkshire Total critical care time: 40 minutes Critical care time was exclusive of separately billable procedures and treating other patients. Critical care was necessary to treat or prevent imminent or life-threatening deterioration. Critical care was time spent personally by me on the following activities: development of treatment plan with patient and/or surrogate as well as nursing, discussions with consultants, evaluation of patient's response to treatment, examination of patient, obtaining history from patient or surrogate, ordering and performing treatments and interventions, ordering and review of laboratory studies, ordering and review of radiographic studies, pulse oximetry and re-evaluation of patient's condition.]     Labs and x-rays reviewed.  Patient's blood pressure improved some with hydralazine and morphine.  Although her blood pressure then became elevated again and she was more agitated.  She is going to be admitted for hypertensive  urgency  Final Clinical Impressions(s) / ED  Diagnoses   Final diagnoses:  Hypertensive urgency    ED Discharge Orders    None       Bethann Berkshire, MD 12/07/18 [phone]

## 2018-12-07 NOTE — ED Notes (Signed)
CONTACT INFO:   Cardell Rotolo (grandson & POA): [phone]

## 2018-12-07 NOTE — H&P (Addendum)
TRH H&P    Patient Demographics:    [name] Mueller, is a 84 y.o. female  MRN: [id number]  DOB - 1934-11-23  Admit Date - 12/07/2018  Referring MD/NP/PA: Dorthula PerfectJoseph Zammitt  Outpatient Primary MD for the patient is Elfredia NevinsFusco, Lawrence, MD  Patient coming from:  home  Chief complaint-  Headache, hypertensive urgency   HPI:    [name] Mueller  is a 84 y.o. female, w hypertension, hyperlipidemia, dm2, CAD, pulmonary hypertension, anemia, adrenal insufficiency, apparently presents with headache (severe).  Pt was brought to ER and found to have bp markedly elevated. Pt apparently denies fever, chills, cough, cp, palp, sob, n/v, abd pain, diarrhea, brbpr, dysuria.   In ED,  T 97.5,  P 99 Bp 50/85-204/83 pox 92-99% on RA Wt 97.1 kg  Wbc 13.8, hgb 12.8, Plt 350 Na 136, K 4.0, Bun 16, Creatinne 1.10 Ast 13, Alt 9  CT brain, CTA head/ neck IMPRESSION: Brain: There is no mass, hemorrhage or extra-axial collection. There is generalized atrophy without lobar predilection. There is hypoattenuation of the periventricular white matter, most commonly indicating chronic ischemic microangiopathy.  1. No emergent large vessel occlusion or high-grade stenosis. 2. Upper mediastinal lymphadenopathy, unchanged compared to 05/30/2015. 3. Moderate-to-severe spinal canal stenosis at C5-6 due to posterior longitudinal ligament calcification.  Pt received hydralazine 5mg  iv x2, labetalol 10mg  iv x1, and ativan / morphine for agitation in ED  Pt will be admitted observation for severe headache, and hypertensive urgency     Review of systems:    In addition to the HPI above,  No Fever-chills, No changes with Vision or hearing, No problems swallowing food or Liquids, No Chest pain, Cough or Shortness of Breath, No Abdominal pain, No Nausea or Vomiting, bowel movements are regular, No Blood in stool or Urine, No dysuria, No new  skin rashes or bruises, No new joints pains-aches,  No new weakness, tingling, numbness in any extremity, No recent weight gain or loss, No polyuria, polydypsia or polyphagia, No significant Mental Stressors.  All other systems reviewed and are negative.    Past History of the following :    Past Medical History:  Diagnosis Date   Anemia    Arterial insufficiency (HCC)    mild- 2007 LEAs   Coronary artery disease    stent to ramus intermedius in 2004   Diabetes mellitus without complication (HCC)    Dyslipidemia    History of nuclear stress test 2004   no significant ischemia    History of pulmonary hypertension    Hypertension    Morbid obesity (HCC)    NSVT (nonsustained ventricular tachycardia) (HCC)    OSA (obstructive sleep apnea)    AHI during total sleep 10.27/hr and REM sleep 40.34/hr      Past Surgical History:  Procedure Laterality Date   CORONARY ANGIOPLASTY  01/2003   [id number] TAXUS DES to ramus intermedius   TRANSTHORACIC ECHOCARDIOGRAM  09/2011   EF=>55%, mild conc LVH, normal LV systolic function; LA mild-mod dilated; mild mitral annular calcif, mild MR; mild TR &  normal RV systolic pressure; trace PVR      Social History:      Social History   Tobacco Use   Smoking status: Never Smoker   Smokeless tobacco: Never Used  Substance Use Topics   Alcohol use: No       Family History :     Family History  Family history unknown: Yes   Pt is unable to provide family history, due to headache, doesn't want to talk  Per Grandson, no family history   Home Medications:   Prior to Admission medications   Medication Sig Start Date End Date Taking? Authorizing Provider  amLODipine (NORVASC) 5 MG tablet Take 1 tablet (5 mg total) by mouth daily. 05/05/13   Hilty, Lisette Abu, MD  clopidogrel (PLAVIX) 75 MG tablet Take 75 mg by mouth daily. 04/12/13   [provider]  Cyanocobalamin (VITAMIN B-12 IJ) Inject as directed every 30  (thirty) days.    [provider]  escitalopram (LEXAPRO) 10 MG tablet Take 10 mg by mouth daily.    [provider]  esomeprazole (NEXIUM) 40 MG capsule Take 40 mg by mouth daily before breakfast.    [provider]  folic acid (FOLVITE) 1 MG tablet Take 1 mg by mouth daily.    [provider]  glimepiride (AMARYL) 4 MG tablet Take 4 mg by mouth 2 (two) times daily.     [provider]  ipratropium (ATROVENT) 0.02 % nebulizer solution Take 2.5 mLs (0.5 mg total) by nebulization every 4 (four) hours as needed for wheezing or shortness of breath. 06/04/15   Erick Blinks, MD  isosorbide mononitrate (IMDUR) 30 MG 24 hr tablet Take 30 mg by mouth daily.    [provider]  metFORMIN (GLUCOPHAGE) 1000 MG tablet Take 1,000 mg by mouth 2 (two) times daily with a meal.     [provider]  metoprolol (LOPRESSOR) 50 MG tablet Take 1 tablet (50 mg total) by mouth 2 (two) times daily. 06/04/15   Erick Blinks, MD  simvastatin (ZOCOR) 20 MG tablet Take 20 mg by mouth daily.     [provider]  topiramate (TOPAMAX) 25 MG tablet Take 25 mg by mouth 2 (two) times daily.    [provider]  valsartan (DIOVAN) 160 MG tablet Take 160 mg by mouth daily.    [provider]     Allergies:     Allergies  Allergen Reactions   Ambien [Zolpidem] Other (See Comments)    confusion   Ezetimibe Other (See Comments)    unknown   Lisinopril Cough     Physical Exam:   Vitals  Blood pressure (!) 159/75, pulse 99, temperature (!) 97.5 F (36.4 C), temperature source Oral, resp. rate 16, height 5\' 9"  (1.753 m), weight 97.1 kg, SpO2 95 %.  1.  General: Axox1, pt too sleepy to respond to questions  2. Psychiatric: euthymic  3. Neurologic: cn2-12 intact, reflexes 2+ symmetric, diffuse, with downgoing toes bialterally. Able to move all 4 ext  4. HEENMT:  Anicteric, pupils 1.22mm symmetric, direct, consensual,  intact Mmm Neck: no jvd, no bruit  5. Respiratory : CTAB  6. Cardiovascular : rrr s1, s2  7. Gastrointestinal:  Abd: soft, obese, nt, nd, +bs  8. Skin:  Ext: no c/c/e, no rash  9.Musculoskeletal:  Good ROM,  No adenopathy    Data Review:    CBC Recent Labs  Lab 12/07/18 0149  WBC 13.8*  HGB 12.8  HCT 38.4  PLT 350  MCV 95.0  MCH 31.7  MCHC 33.3  RDW 13.2  LYMPHSABS 1.4  MONOABS 0.9  EOSABS 0.3  BASOSABS 0.2*   ------------------------------------------------------------------------------------------------------------------  Results for orders placed or performed during the hospital encounter of 12/07/18 (from the past 48 hour(s))  CBC with Differential/Platelet     Status: Abnormal   Collection Time: 12/07/18  1:49 AM  Result Value Ref Range   WBC 13.8 (H) 4.0 - 10.5 K/uL   RBC 4.04 3.87 - 5.11 MIL/uL   Hemoglobin 12.8 12.0 - 15.0 g/dL   HCT 16.1 09.6 - 04.5 %   MCV 95.0 80.0 - 100.0 fL   MCH 31.7 26.0 - 34.0 pg   MCHC 33.3 30.0 - 36.0 g/dL   RDW 40.9 81.1 - 91.4 %   Platelets 350 150 - 400 K/uL   nRBC 0.0 0.0 - 0.2 %   Neutrophils Relative % 80 %   Neutro Abs 10.9 (H) 1.7 - 7.7 K/uL   Lymphocytes Relative 10 %   Lymphs Abs 1.4 0.7 - 4.0 K/uL   Monocytes Relative 7 %   Monocytes Absolute 0.9 0.1 - 1.0 K/uL   Eosinophils Relative 2 %   Eosinophils Absolute 0.3 0.0 - 0.5 K/uL   Basophils Relative 1 %   Basophils Absolute 0.2 (H) 0.0 - 0.1 K/uL   Immature Granulocytes 0 %   Abs Immature Granulocytes 0.04 0.00 - 0.07 K/uL    Comment: Performed at [facility], [address] [place], [place] [address]  Comprehensive metabolic panel     Status: Abnormal   Collection Time: 12/07/18  1:49 AM  Result Value Ref Range   Sodium 136 135 - 145 mmol/L   Potassium 4.0 3.5 - 5.1 mmol/L   Chloride 98 98 - 111 mmol/L   CO2 26 22 - 32 mmol/L   Glucose, Bld 323 (H) 70 - 99 mg/dL   BUN 16 8 - 23 mg/dL   Creatinine, Ser 6.21 (H) 0.44 - 1.00 mg/dL   Calcium 9.0  8.9 - 30.8 mg/dL   Total Protein 6.5 6.5 - 8.1 g/dL   Albumin 3.4 (L) 3.5 - 5.0 g/dL   AST 13 (L) 15 - 41 U/L   ALT 9 0 - 44 U/L   Alkaline Phosphatase 80 38 - 126 U/L   Total Bilirubin 1.0 0.3 - 1.2 mg/dL   GFR calc non Af Amer 47 (L) >60 mL/min   GFR calc Af Amer 54 (L) >60 mL/min   Anion gap 12 5 - 15    Comment: Performed at [facility], [address], [place], [place] [address]  Urinalysis, Routine w reflex microscopic     Status: Abnormal   Collection Time: 12/07/18  4:38 AM  Result Value Ref Range   Color, Urine STRAW (A) YELLOW   APPearance CLEAR CLEAR   Specific Gravity, Urine 1.024 1.005 - 1.030   pH 7.0 5.0 - 8.0   Glucose, UA >=500 (A) NEGATIVE mg/dL   Hgb urine dipstick NEGATIVE NEGATIVE   Bilirubin Urine NEGATIVE NEGATIVE   Ketones, ur NEGATIVE NEGATIVE mg/dL   Protein, ur NEGATIVE NEGATIVE mg/dL   Nitrite NEGATIVE NEGATIVE   Leukocytes,Ua NEGATIVE NEGATIVE   WBC, UA 0-5 0 - 5 WBC/hpf   Bacteria, UA NONE SEEN NONE SEEN    Comment: Performed at [facility], [address] [place], [place] [address]  SARS Coronavirus 2 [facility] order, Performed in [facility] hospital lab)     Status: None   Collection  Time: 12/07/18  4:45 AM  Result Value Ref Range   SARS Coronavirus 2 NEGATIVE NEGATIVE    Comment: (NOTE) If result is NEGATIVE SARS-CoV-2 target nucleic acids are NOT DETECTED. The SARS-CoV-2 RNA is generally detectable in upper and lower  respiratory specimens during the acute phase of infection. The lowest  concentration of SARS-CoV-2 viral copies this assay can detect is 250  copies / mL. A negative result does not preclude SARS-CoV-2 infection  and should not be used as the sole basis for treatment or other  patient management decisions.  A negative result may occur with  improper specimen collection / handling, submission of specimen other  than nasopharyngeal swab, presence of viral mutation(s) within the  areas targeted by this assay, and inadequate  number of viral copies  (<250 copies / mL). A negative result must be combined with clinical  observations, patient history, and epidemiological information. If result is POSITIVE SARS-CoV-2 target nucleic acids are DETECTED. The SARS-CoV-2 RNA is generally detectable in upper and lower  respiratory specimens dur ing the acute phase of infection.  Positive  results are indicative of active infection with SARS-CoV-2.  Clinical  correlation with patient history and other diagnostic information is  necessary to determine patient infection status.  Positive results do  not rule out bacterial infection or co-infection with other viruses. If result is PRESUMPTIVE POSTIVE SARS-CoV-2 nucleic acids MAY BE PRESENT.   A presumptive positive result was obtained on the submitted specimen  and confirmed on repeat testing.  While 2019 novel coronavirus  (SARS-CoV-2) nucleic acids may be present in the submitted sample  additional confirmatory testing may be necessary for epidemiological  and / or clinical management purposes  to differentiate between  SARS-CoV-2 and other Sarbecovirus currently known to infect humans.  If clinically indicated additional testing with an alternate test  methodology [phone]) is advised. The SARS-CoV-2 RNA is generally  detectable in upper and lower respiratory sp ecimens during the acute  phase of infection. The expected result is Negative. Fact Sheet for Patients:  BoilerBrush.com.cy Fact Sheet for Healthcare Providers: https://pope.com/ This test is not yet approved or cleared by the [place] FDA and has been authorized for detection and/or diagnosis of SARS-CoV-2 by FDA under an Emergency Use Authorization (EUA).  This EUA will remain in effect (meaning this test can be used) for the duration of the COVID-19 declaration under Section 564(b)(1) of the Act, 21 U.S.C. section 360bbb-3(b)(1), unless the  authorization is terminated or revoked sooner. Performed at [facility], [address] [place], [place] [address]     Chemistries  Recent Labs  Lab 12/07/18 0149  NA 136  K 4.0  CL 98  CO2 26  GLUCOSE 323*  BUN 16  CREATININE 1.10*  CALCIUM 9.0  AST 13*  ALT 9  ALKPHOS 80  BILITOT 1.0   ------------------------------------------------------------------------------------------------------------------  ------------------------------------------------------------------------------------------------------------------ GFR: Estimated Creatinine Clearance: 48.9 mL/min (A) (by C-G formula based on SCr of 1.1 mg/dL (H)). Liver Function Tests: Recent Labs  Lab 12/07/18 0149  AST 13*  ALT 9  ALKPHOS 80  BILITOT 1.0  PROT 6.5  ALBUMIN 3.4*   No results for input(s): LIPASE, AMYLASE in the last 168 hours. No results for input(s): AMMONIA in the last 168 hours. Coagulation Profile: No results for input(s): INR, PROTIME in the last 168 hours. Cardiac Enzymes: No results for input(s): CKTOTAL, CKMB, CKMBINDEX, TROPONINI in the last 168 hours. BNP (last 3 results) No results for input(s): PROBNP in the last  8760 hours. HbA1C: No results for input(s): HGBA1C in the last 72 hours. CBG: No results for input(s): GLUCAP in the last 168 hours. Lipid Profile: No results for input(s): CHOL, HDL, LDLCALC, TRIG, CHOLHDL, LDLDIRECT in the last 72 hours. Thyroid Function Tests: No results for input(s): TSH, T4TOTAL, FREET4, T3FREE, THYROIDAB in the last 72 hours. Anemia Panel: No results for input(s): VITAMINB12, FOLATE, FERRITIN, TIBC, IRON, RETICCTPCT in the last 72 hours.  --------------------------------------------------------------------------------------------------------------- Urine analysis:    Component Value Date/Time   COLORURINE STRAW (A) 12/07/2018 0438   APPEARANCEUR CLEAR 12/07/2018 0438   LABSPEC 1.024 12/07/2018 0438   PHURINE 7.0 12/07/2018 0438   GLUCOSEU  >=500 (A) 12/07/2018 0438   HGBUR NEGATIVE 12/07/2018 0438   BILIRUBINUR NEGATIVE 12/07/2018 0438   KETONESUR NEGATIVE 12/07/2018 0438   PROTEINUR NEGATIVE 12/07/2018 0438   UROBILINOGEN 0.2 05/31/2015 0045   NITRITE NEGATIVE 12/07/2018 0438   LEUKOCYTESUR NEGATIVE 12/07/2018 0438      Imaging Results:    Ct Angio Head W Or Wo Contrast  Result Date: 12/07/2018 CLINICAL DATA:  Altered mental status. Severe headache. Hypertension. EXAM: CT HEAD WITHOUT CONTRAST CT ANGIOGRAPHY OF THE HEAD AND NECK TECHNIQUE: Contiguous axial images were obtained from the base of the skull through the vertex without intravenous contrast. Multidetector CT imaging of the head and neck was performed using the standard protocol during bolus administration of intravenous contrast. Multiplanar CT image reconstructions and MIPs were obtained to evaluate the vascular anatomy. Carotid stenosis measurements (when applicable) are obtained utilizing NASCET criteria, using the distal internal carotid diameter as the denominator. CONTRAST:  OMNIPAQUE IOHEXOL 350 MG/ML SOLN COMPARISON:  Chest CT 05/30/2015 FINDINGS: CT HEAD FINDINGS Brain: There is no mass, hemorrhage or extra-axial collection. There is generalized atrophy without lobar predilection. There is hypoattenuation of the periventricular white matter, most commonly indicating chronic ischemic microangiopathy. Skull: The visualized skull base, calvarium and extracranial soft tissues are normal. Sinuses/Orbits: No fluid levels or advanced mucosal thickening of the visualized paranasal sinuses. No mastoid or middle ear effusion. The orbits are normal. CTA NECK FINDINGS SKELETON: There is moderate-to-severe spinal canal stenosis at C5-6 due to bulky calcifications along the posterior longitudinal ligament. OTHER NECK: Multiple enlarged right mediastinal lymph nodes measuring up to 10 mm. There are multiple subcentimeter hypodense nodules of thyroid gland. UPPER CHEST: No  pneumothorax or pleural effusion. No nodules or masses. AORTIC ARCH: There is mild calcific atherosclerosis of the aortic arch. There is no aneurysm, dissection or hemodynamically significant stenosis of the visualized ascending aorta and aortic arch. Conventional 3 vessel aortic branching pattern. The visualized proximal subclavian arteries are widely patent. RIGHT CAROTID SYSTEM: --Common carotid artery: Widely patent origin without common carotid artery dissection or aneurysm. --Internal carotid artery: No dissection, occlusion or aneurysm. Mild atherosclerotic calcification at the carotid bifurcation without hemodynamically significant stenosis. --External carotid artery: No acute abnormality. LEFT CAROTID SYSTEM: --Common carotid artery: Widely patent origin without common carotid artery dissection or aneurysm. --Internal carotid artery: No dissection, occlusion or aneurysm. Mild atherosclerotic calcification at the carotid bifurcation without hemodynamically significant stenosis. --External carotid artery: No acute abnormality. VERTEBRAL ARTERIES: Right dominant configuration. Both origins are normal. No dissection, occlusion or flow-limiting stenosis to the skull base. CTA HEAD FINDINGS POSTERIOR CIRCULATION: --Vertebral arteries: Moderate atherosclerotic calcification of both V4 segments. --Posterior inferior cerebellar arteries (PICA): Patent origins from the vertebral arteries. --Anterior inferior cerebellar arteries (AICA): Patent origins from the basilar artery. --Basilar artery: Normal. --Superior cerebellar arteries: Normal. --Posterior cerebral arteries (PCA): Normal.  Both originate from the basilar artery. Posterior communicating arteries (p-comm) are diminutive or absent. ANTERIOR CIRCULATION: --Intracranial internal carotid arteries: Atherosclerotic calcification of the internal carotid arteries at the skull base without hemodynamically significant stenosis. --Anterior cerebral arteries (ACA):  Normal. Hypoplastic right A1 segment, normal variant. --Middle cerebral arteries (MCA): Normal. VENOUS SINUSES: As permitted by contrast timing, patent. ANATOMIC VARIANTS: None Review of the MIP images confirms the above findings. IMPRESSION: 1. No emergent large vessel occlusion or high-grade stenosis. 2. Upper mediastinal lymphadenopathy, unchanged compared to 05/30/2015. 3. Moderate-to-severe spinal canal stenosis at C5-6 due to posterior longitudinal ligament calcification. Electronically Signed   By: Deatra Robinson M.D.   On: 12/07/2018 03:08   Ct Head Wo Contrast  Result Date: 12/07/2018 CLINICAL DATA:  Altered mental status. Severe headache. Hypertension. EXAM: CT HEAD WITHOUT CONTRAST CT ANGIOGRAPHY OF THE HEAD AND NECK TECHNIQUE: Contiguous axial images were obtained from the base of the skull through the vertex without intravenous contrast. Multidetector CT imaging of the head and neck was performed using the standard protocol during bolus administration of intravenous contrast. Multiplanar CT image reconstructions and MIPs were obtained to evaluate the vascular anatomy. Carotid stenosis measurements (when applicable) are obtained utilizing NASCET criteria, using the distal internal carotid diameter as the denominator. CONTRAST:  OMNIPAQUE IOHEXOL 350 MG/ML SOLN COMPARISON:  Chest CT 05/30/2015 FINDINGS: CT HEAD FINDINGS Brain: There is no mass, hemorrhage or extra-axial collection. There is generalized atrophy without lobar predilection. There is hypoattenuation of the periventricular white matter, most commonly indicating chronic ischemic microangiopathy. Skull: The visualized skull base, calvarium and extracranial soft tissues are normal. Sinuses/Orbits: No fluid levels or advanced mucosal thickening of the visualized paranasal sinuses. No mastoid or middle ear effusion. The orbits are normal. CTA NECK FINDINGS SKELETON: There is moderate-to-severe spinal canal stenosis at C5-6 due to bulky  calcifications along the posterior longitudinal ligament. OTHER NECK: Multiple enlarged right mediastinal lymph nodes measuring up to 10 mm. There are multiple subcentimeter hypodense nodules of thyroid gland. UPPER CHEST: No pneumothorax or pleural effusion. No nodules or masses. AORTIC ARCH: There is mild calcific atherosclerosis of the aortic arch. There is no aneurysm, dissection or hemodynamically significant stenosis of the visualized ascending aorta and aortic arch. Conventional 3 vessel aortic branching pattern. The visualized proximal subclavian arteries are widely patent. RIGHT CAROTID SYSTEM: --Common carotid artery: Widely patent origin without common carotid artery dissection or aneurysm. --Internal carotid artery: No dissection, occlusion or aneurysm. Mild atherosclerotic calcification at the carotid bifurcation without hemodynamically significant stenosis. --External carotid artery: No acute abnormality. LEFT CAROTID SYSTEM: --Common carotid artery: Widely patent origin without common carotid artery dissection or aneurysm. --Internal carotid artery: No dissection, occlusion or aneurysm. Mild atherosclerotic calcification at the carotid bifurcation without hemodynamically significant stenosis. --External carotid artery: No acute abnormality. VERTEBRAL ARTERIES: Right dominant configuration. Both origins are normal. No dissection, occlusion or flow-limiting stenosis to the skull base. CTA HEAD FINDINGS POSTERIOR CIRCULATION: --Vertebral arteries: Moderate atherosclerotic calcification of both V4 segments. --Posterior inferior cerebellar arteries (PICA): Patent origins from the vertebral arteries. --Anterior inferior cerebellar arteries (AICA): Patent origins from the basilar artery. --Basilar artery: Normal. --Superior cerebellar arteries: Normal. --Posterior cerebral arteries (PCA): Normal. Both originate from the basilar artery. Posterior communicating arteries (p-comm) are diminutive or absent.  ANTERIOR CIRCULATION: --Intracranial internal carotid arteries: Atherosclerotic calcification of the internal carotid arteries at the skull base without hemodynamically significant stenosis. --Anterior cerebral arteries (ACA): Normal. Hypoplastic right A1 segment, normal variant. --Middle cerebral arteries (MCA): Normal. VENOUS SINUSES:  As permitted by contrast timing, patent. ANATOMIC VARIANTS: None Review of the MIP images confirms the above findings. IMPRESSION: 1. No emergent large vessel occlusion or high-grade stenosis. 2. Upper mediastinal lymphadenopathy, unchanged compared to 05/30/2015. 3. Moderate-to-severe spinal canal stenosis at C5-6 due to posterior longitudinal ligament calcification. Electronically Signed   By: Deatra Robinson M.D.   On: 12/07/2018 03:08   Ct Angio Neck W And/or Wo Contrast  Result Date: 12/07/2018 CLINICAL DATA:  Altered mental status. Severe headache. Hypertension. EXAM: CT HEAD WITHOUT CONTRAST CT ANGIOGRAPHY OF THE HEAD AND NECK TECHNIQUE: Contiguous axial images were obtained from the base of the skull through the vertex without intravenous contrast. Multidetector CT imaging of the head and neck was performed using the standard protocol during bolus administration of intravenous contrast. Multiplanar CT image reconstructions and MIPs were obtained to evaluate the vascular anatomy. Carotid stenosis measurements (when applicable) are obtained utilizing NASCET criteria, using the distal internal carotid diameter as the denominator. CONTRAST:  OMNIPAQUE IOHEXOL 350 MG/ML SOLN COMPARISON:  Chest CT 05/30/2015 FINDINGS: CT HEAD FINDINGS Brain: There is no mass, hemorrhage or extra-axial collection. There is generalized atrophy without lobar predilection. There is hypoattenuation of the periventricular white matter, most commonly indicating chronic ischemic microangiopathy. Skull: The visualized skull base, calvarium and extracranial soft tissues are normal. Sinuses/Orbits: No  fluid levels or advanced mucosal thickening of the visualized paranasal sinuses. No mastoid or middle ear effusion. The orbits are normal. CTA NECK FINDINGS SKELETON: There is moderate-to-severe spinal canal stenosis at C5-6 due to bulky calcifications along the posterior longitudinal ligament. OTHER NECK: Multiple enlarged right mediastinal lymph nodes measuring up to 10 mm. There are multiple subcentimeter hypodense nodules of thyroid gland. UPPER CHEST: No pneumothorax or pleural effusion. No nodules or masses. AORTIC ARCH: There is mild calcific atherosclerosis of the aortic arch. There is no aneurysm, dissection or hemodynamically significant stenosis of the visualized ascending aorta and aortic arch. Conventional 3 vessel aortic branching pattern. The visualized proximal subclavian arteries are widely patent. RIGHT CAROTID SYSTEM: --Common carotid artery: Widely patent origin without common carotid artery dissection or aneurysm. --Internal carotid artery: No dissection, occlusion or aneurysm. Mild atherosclerotic calcification at the carotid bifurcation without hemodynamically significant stenosis. --External carotid artery: No acute abnormality. LEFT CAROTID SYSTEM: --Common carotid artery: Widely patent origin without common carotid artery dissection or aneurysm. --Internal carotid artery: No dissection, occlusion or aneurysm. Mild atherosclerotic calcification at the carotid bifurcation without hemodynamically significant stenosis. --External carotid artery: No acute abnormality. VERTEBRAL ARTERIES: Right dominant configuration. Both origins are normal. No dissection, occlusion or flow-limiting stenosis to the skull base. CTA HEAD FINDINGS POSTERIOR CIRCULATION: --Vertebral arteries: Moderate atherosclerotic calcification of both V4 segments. --Posterior inferior cerebellar arteries (PICA): Patent origins from the vertebral arteries. --Anterior inferior cerebellar arteries (AICA): Patent origins from the  basilar artery. --Basilar artery: Normal. --Superior cerebellar arteries: Normal. --Posterior cerebral arteries (PCA): Normal. Both originate from the basilar artery. Posterior communicating arteries (p-comm) are diminutive or absent. ANTERIOR CIRCULATION: --Intracranial internal carotid arteries: Atherosclerotic calcification of the internal carotid arteries at the skull base without hemodynamically significant stenosis. --Anterior cerebral arteries (ACA): Normal. Hypoplastic right A1 segment, normal variant. --Middle cerebral arteries (MCA): Normal. VENOUS SINUSES: As permitted by contrast timing, patent. ANATOMIC VARIANTS: None Review of the MIP images confirms the above findings. IMPRESSION: 1. No emergent large vessel occlusion or high-grade stenosis. 2. Upper mediastinal lymphadenopathy, unchanged compared to 05/30/2015. 3. Moderate-to-severe spinal canal stenosis at C5-6 due to posterior longitudinal ligament calcification. Electronically Signed  By: Deatra Robinson M.D.   On: 12/07/2018 03:08    ekg nsr at 80,  Nl axis, nl int, early R progression, no st-t changes c/w ischemia   Assessment & Plan:    Principal Problem:   Hypertensive urgency Active Problems:   ANEMIA   Diabetes mellitus without complication (HCC)   Headache  Headache resolved per pt Cont Topiramate  po bid, grandson was unaware if taking this or not.  Neurology consult to see if have suggestions regarding acute headache  Hypertensive urgency Cont Amlodipine  po qday, may consider increase to  po qday Cont lopressor  po bid Increase Valsartan from 160=>  po qday Hydralazine  iv q6h prn sbp >160  CAD Unable to ascertain if taking aspirin Cont plavix  po qday Cont Metoprolol Cont Valsartan Cont Amlodipine Cont Simvastatin  po qhs Cont Imdur  po qday  Dm2 Cont Amaryl  po bid Cont Metformin  po bid fsbs ac and qhs, ISS  Anxiety Cont Lexapro  po  qday  Gerd Cont PPI    DVT Prophylaxis-   Lovenox - SCDs   AM Labs Ordered, also please review Full Orders  Family Communication: Admission, patients condition and plan of care including tests being ordered have been discussed with the patient's grandson who indicate understanding and agree with the plan and Code Status.  Code Status:  FULL CODE  Admission status: Observationt: Based on patients clinical presentation and evaluation of above clinical data, I have made determination that patient meets observation criteria at this time.   Time spent in minutes : 55 minutes   Pearson Grippe M.D on 12/07/2018 at 6:11 AM

## 2018-12-07 NOTE — Progress Notes (Signed)
Pt was found by NT and RN to be covered in black emesis, also had undigested food and blood clots present in it. MD made aware, and also made aware that patient is too lethargic to take any PO medications. Once MD assesses patient, possibly will change meds to IV route. Will continue to monitor.

## 2018-12-07 NOTE — Progress Notes (Signed)
Spoke with grandson [name] Mueller to answer admission questions and get information about his grandmother since pt is unable to. States patient was A&0X4 before admission yet she would have periods of confusion but always knew where she was and what was going on. [name] Mueller is wanting to come up to see grandmother as he thinks he can help settle the patient and get her less anxious. RN explained the current visitor restrictions, but stated to him that RN would mention it to the doctor. Will continue to monitor.

## 2018-12-07 NOTE — ED Triage Notes (Signed)
Pt c/o severe HA that started about 2200 last night. No deficits but is hypertensive. Never had a HA like this before

## 2018-12-07 NOTE — ED Notes (Addendum)
Pt found covered in dried brown emesis. Pt moaning and groaning. RN and NT went in to clean pt up and pt very combative to nursing staff while attempting to clean her. Staff attempted to redirect pt. Pt not responsive to soothing techniques. After pt was cleaned up, warm blanket given and lights dimmed, pt relaxed and fell back asleep.

## 2018-12-07 NOTE — Progress Notes (Signed)
Pharmacy Antibiotic Note  [name] Mueller is a 84 y.o. female admitted on 12/07/2018 with aspiration pneumonia.  Pharmacy has been consulted for Zosyn dosing.  Plan: Zosyn 3.375g IV q8h (4 hour infusion).  F/u cxs and clinical progress Monitor V/S and labs  Height: 5\' 9"  (175.3 cm) Weight: 191 lb 5.8 oz (86.8 kg) IBW/kg (Calculated) : 66.2  Temp (24hrs), Avg:99.6 F (37.6 C), Min:97.5 F (36.4 C), Max:101.4 F (38.6 C)  Recent Labs  Lab 12/07/18 0149  WBC 13.8*  CREATININE 1.10*    Estimated Creatinine Clearance: 46.3 mL/min (A) (by C-G formula based on SCr of 1.1 mg/dL (H)).    Allergies  Allergen Reactions  . Ambien [Zolpidem] Other (See Comments)    confusion  . Ezetimibe Other (See Comments)    unknown  . Lisinopril Cough    Antimicrobials this admission: Zosyn 5/11 >>   Microbiology results: 5/11 BCx: pending 5/11 UCx: pending 5/11 SARS-2 CV is negative  Thank you for allowing pharmacy to be a part of this patient's care.  Elder Cyphers, BS Loura Back, [place] Clinical Pharmacist Pager [phone] 12/07/2018 7:40 PM

## 2018-12-07 NOTE — Progress Notes (Signed)
Per HPI: [name] Mueller  is a 84 y.o. female, w hypertension, hyperlipidemia, dm2, CAD, pulmonary hypertension, anemia, adrenal insufficiency, apparently presents with headache (severe).  Pt was brought to ER and found to have bp markedly elevated. Pt apparently denies fever, chills, cough, cp, palp, sob, n/v, abd pain, diarrhea, brbpr, dysuria.   Patient was admitted with severe headache likely related to hypertensive urgency.  She appears to be quite anxious this morning and is complaining of epigastric abdominal pain and was noted to have profound hematemesis with clots noted earlier.  No report to prior endoscopy noted in epic.  Will switch to IV hydralazine every 6 hours as well as metoprolol every 6 hours and continue to monitor for improvement.  May add clonidine patch as well.  If blood pressure not adequately controlled, may have to transfer to stepdown unit with Cardene drip.  In the meantime, will consult with gastroenterology for hematemesis evaluation and recheck H&H.  PPI IV twice daily initiated and patient n.p.o.  Started on IV fluid gentle.  Recheck labs in a.m.

## 2018-12-07 NOTE — Progress Notes (Signed)
Consent signed and obtained by grandson who is POA via telephone consent with two RN's.

## 2018-12-08 ENCOUNTER — Encounter (HOSPITAL_COMMUNITY)
Admission: EM | Disposition: A | Discharge status: Home Health | Payer: Self-pay | Source: Home / Self Care | Attending: Internal Medicine

## 2018-12-08 LAB — CBC
HCT: 37.2 % (ref 36.0–46.0)
Hemoglobin: 12.2 g/dL (ref 12.0–15.0)
MCH: 31.2 pg (ref 26.0–34.0)
MCHC: 32.8 g/dL (ref 30.0–36.0)
MCV: 95.1 fL (ref 80.0–100.0)
Platelets: 343 10*3/uL (ref 150–400)
RBC: 3.91 MIL/uL (ref 3.87–5.11)
RDW: 13.5 % (ref 11.5–15.5)
WBC: 18.8 10*3/uL — ABNORMAL HIGH (ref 4.0–10.5)
nRBC: 0 % (ref 0.0–0.2)

## 2018-12-08 LAB — COMPREHENSIVE METABOLIC PANEL
ALT: 12 U/L (ref 0–44)
AST: 24 U/L (ref 15–41)
Albumin: 3.3 g/dL — ABNORMAL LOW (ref 3.5–5.0)
Alkaline Phosphatase: 66 U/L (ref 38–126)
Anion gap: 12 (ref 5–15)
BUN: 16 mg/dL (ref 8–23)
CO2: 24 mmol/L (ref 22–32)
Calcium: 8.7 mg/dL — ABNORMAL LOW (ref 8.9–10.3)
Chloride: 101 mmol/L (ref 98–111)
Creatinine, Ser: 1.15 mg/dL — ABNORMAL HIGH (ref 0.44–1.00)
GFR calc Af Amer: 51 mL/min — ABNORMAL LOW (ref >60)
GFR calc non Af Amer: 44 mL/min — ABNORMAL LOW (ref >60)
Glucose, Bld: 105 mg/dL — ABNORMAL HIGH (ref 70–99)
Potassium: 3.5 mmol/L (ref 3.5–5.1)
Sodium: 137 mmol/L (ref 135–145)
Total Bilirubin: 1.6 mg/dL — ABNORMAL HIGH (ref 0.3–1.2)
Total Protein: 6.1 g/dL — ABNORMAL LOW (ref 6.5–8.1)

## 2018-12-08 LAB — GLUCOSE, CAPILLARY
Glucose-Capillary: 107 mg/dL — ABNORMAL HIGH (ref 70–99)
Glucose-Capillary: 134 mg/dL — ABNORMAL HIGH (ref 70–99)
Glucose-Capillary: 139 mg/dL — ABNORMAL HIGH (ref 70–99)
Glucose-Capillary: 146 mg/dL — ABNORMAL HIGH (ref 70–99)
Glucose-Capillary: 170 mg/dL — ABNORMAL HIGH (ref 70–99)
Glucose-Capillary: 200 mg/dL — ABNORMAL HIGH (ref 70–99)

## 2018-12-08 LAB — LACTIC ACID, PLASMA: Lactic Acid, Venous: 1.6 mmol/L (ref 0.5–1.9)

## 2018-12-08 LAB — PROCALCITONIN: Procalcitonin: <0.1 ng/mL

## 2018-12-08 LAB — URINE CULTURE: Culture: NO GROWTH

## 2018-12-08 SURGERY — EGD (ESOPHAGOGASTRODUODENOSCOPY)
Anesthesia: Moderate Sedation

## 2018-12-08 MED ORDER — METOPROLOL TARTRATE 5 MG/5ML IV SOLN
10.0000 mg | Freq: Four times a day (QID) | INTRAVENOUS | Status: DC
  Filled 2018-12-08: qty 10

## 2018-12-08 MED ORDER — METOPROLOL TARTRATE 5 MG/5ML IV SOLN
5.0000 mg | Freq: Four times a day (QID) | INTRAVENOUS | Status: DC
  Administered 2018-12-08 – 2018-12-10 (×7): 5 mg via INTRAVENOUS
  Filled 2018-12-08 (×6): qty 5

## 2018-12-08 MED ORDER — ESCITALOPRAM OXALATE 10 MG PO TABS
10.0000 mg | ORAL_TABLET | Freq: Every day | ORAL | Status: DC
  Administered 2018-12-10 – 2018-12-12 (×3): 10 mg via ORAL
  Filled 2018-12-08 (×4): qty 1

## 2018-12-08 MED ORDER — METOPROLOL TARTRATE 50 MG PO TABS
50.0000 mg | ORAL_TABLET | Freq: Two times a day (BID) | ORAL | Status: DC
  Administered 2018-12-09 – 2018-12-12 (×6): 50 mg via ORAL
  Filled 2018-12-08 (×8): qty 1

## 2018-12-08 MED ORDER — AMLODIPINE BESYLATE 5 MG PO TABS
5.0000 mg | ORAL_TABLET | Freq: Every day | ORAL | Status: DC
  Administered 2018-12-10 – 2018-12-11 (×2): 5 mg via ORAL
  Filled 2018-12-08 (×3): qty 1

## 2018-12-08 MED ORDER — HYDRALAZINE HCL 20 MG/ML IJ SOLN
10.0000 mg | Freq: Four times a day (QID) | INTRAMUSCULAR | Status: DC
  Administered 2018-12-08 – 2018-12-10 (×6): 10 mg via INTRAVENOUS
  Filled 2018-12-08 (×6): qty 1

## 2018-12-08 MED ORDER — CLONIDINE HCL 0.1 MG/24HR TD PTWK: 0.1000 mg | MEDICATED_PATCH | TRANSDERMAL | Status: DC

## 2018-12-08 MED ORDER — ISOSORBIDE MONONITRATE ER 60 MG PO TB24
30.0000 mg | ORAL_TABLET | Freq: Every day | ORAL | Status: DC
  Administered 2018-12-11 – 2018-12-12 (×2): 30 mg via ORAL
  Filled 2018-12-08 (×4): qty 1

## 2018-12-08 MED ORDER — HYDRALAZINE HCL 20 MG/ML IJ SOLN: 10.0000 mg | INTRAMUSCULAR | Status: DC | PRN

## 2018-12-08 NOTE — Progress Notes (Signed)
PROGRESS NOTE    [name] Mueller  HWT:[id number] DOB: 22-Jan-1935 DOA: 12/07/2018 PCP: Elfredia Nevins, MD   Brief Narrative:  Per HPI: [name] a84 y.o.female,w hypertension, hyperlipidemia, dm2, CAD, pulmonary hypertension, anemia, adrenal insufficiency, apparently presents with headache (severe). Pt was brought to ER and found to have bp markedly elevated. Pt apparently denies fever, chills, cough, cp, palp, sob, n/v, abd pain, diarrhea, brbpr, dysuria.   Patient was admitted with severe headache likely related to hypertensive urgency.  She was noted to have some severe anxiety along with epigastric abdominal pain and profound hematemesis with blood clots noted on the morning of 5/11.  Blood pressures continue to remain somewhat elevated, but she has been started on a clear liquid diet by GI on 5/12 and will resume some of her home blood pressure medications today while maintaining on IV fluid.  Zosyn was started as well on 5/11 due to concerns for aspiration pneumonia and she is noted to have leukocytosis and ongoing low-grade temperatures.  Chest x-ray with no acute findings noted and blood cultures currently negative.  She has been started back on clear liquids today with anticipated EGD in a.m. by GI.  Assessment & Plan:   Principal Problem:   Hypertensive urgency Active Problems:   ANEMIA   Diabetes mellitus without complication (HCC)   Headache   Hematemesis   1. Headache likely secondary to hypertensive urgency-improved.  Blood pressures are starting to improve and will restart home medications of amlodipine, metoprolol, and Imdur.  Will hold valsartan for now and use hydralazine IV as needed for significant elevations.  Uncertain whether she was compliant with home medications.  Head CT initially noted to be negative. 2. Hematemesis-currently resolved.  Hemoglobin remained stable at this time and GI has evaluated patient today and she is to remain on clear liquid diet  until further stabilized for EGD which is anticipated for tomorrow.  She will be n.p.o. after midnight.  PPI twice daily per GI. 3. Fever with possible aspiration pneumonia secondary to above.  Chest x-ray currently noted to be clear, but there is low-grade fever and leukocytosis.  Continue to monitor blood cultures which are currently negative.  Will maintain on IV Zosyn for now.  Procalcitonin ordered.  Aspiration precautions.  Urine analysis noted to be without signs of UTI on admission. 4. CAD.  Holding Plavix at this time and restarting most home medications aside from valsartan due to some renal insufficiency.  Will maintain on some IV fluid today. 5. Type 2 diabetes.  Holding home medications of Amaryl and metformin and maintain on sliding scale insulin.  Blood glucose levels have improved. 6. Anxiety.  Restart home Lexapro and maintain on Ativan as needed. 7. GERD.  Continue on PPI per GI recommendations at this time.   DVT prophylaxis: SCDs Code Status: Full Family Communication: [name] Mueller has been updated by GI Disposition Plan: Plan for potential EGD by GI in a.m.  Continue on Zosyn and de-escalate as appropriate.  Continue blood pressure management with possible need for PT evaluation and SNF/rehab on discharge.   Consultants:   GI  Procedures:   None  Antimicrobials:   Zosyn 5/11->   Subjective: Patient seen and evaluated today with no new acute complaints or concerns. No acute concerns or events noted overnight.  She has had no further nausea or vomiting and appears to be slightly more alert but only oriented to self.  She was noted to be febrile overnight and has more stable temperature readings this morning.  Objective: Vitals:   12/07/18 2236 12/07/18 2240 12/08/18 0439 12/08/18 0807  BP: (!) 156/88  (!) 178/93   Pulse: 97  (!) 108   Resp:   18   Temp:  100.3 F (37.9 C) 100.1 F (37.8 C)   TempSrc:  Axillary Axillary   SpO2:   100% 97%  Weight:      Height:         Intake/Output Summary (Last 24 hours) at 12/08/2018 1045 Last data filed at 12/08/2018 0357 Gross per 24 hour  Intake 882.37 ml  Output --  Net 882.37 ml   Filed Weights   12/07/18 0125 12/07/18 0848  Weight: 97.1 kg 86.8 kg    Examination:  General exam: Appears calm and comfortable  Respiratory system: Clear to auscultation. Respiratory effort normal.  On nasal cannula Cardiovascular system: S1 & S2 heard, RRR. No JVD, murmurs, rubs, gallops or clicks. No pedal edema. Gastrointestinal system: Abdomen is nondistended, soft and nontender. No organomegaly or masses felt. Normal bowel sounds heard. Central nervous system: Alert and oriented to self. Extremities: Symmetric 5 x 5 power. Skin: No rashes, lesions or ulcers Psychiatry: Cannot be assessed.    Data Reviewed: I have personally reviewed following labs and imaging studies  CBC: Recent Labs  Lab 12/07/18 0149 12/07/18 1054 12/08/18 0613  WBC 13.8*  --  18.8*  NEUTROABS 10.9*  --   --   HGB 12.8 12.8 12.2  HCT 38.4 38.4 37.2  MCV 95.0  --  95.1  PLT 350  --  343   Basic Metabolic Panel: Recent Labs  Lab 12/07/18 0149 12/08/18 0613  NA 136 137  K 4.0 3.5  CL 98 101  CO2 26 24  GLUCOSE 323* 105*  BUN 16 16  CREATININE 1.10* 1.15*  CALCIUM 9.0 8.7*   GFR: Estimated Creatinine Clearance: 44.3 mL/min (A) (by C-G formula based on SCr of 1.15 mg/dL (H)). Liver Function Tests: Recent Labs  Lab 12/07/18 0149 12/08/18 0613  AST 13* 24  ALT 9 12  ALKPHOS 80 66  BILITOT 1.0 1.6*  PROT 6.5 6.1*  ALBUMIN 3.4* 3.3*   No results for input(s): LIPASE, AMYLASE in the last 168 hours. No results for input(s): AMMONIA in the last 168 hours. Coagulation Profile: No results for input(s): INR, PROTIME in the last 168 hours. Cardiac Enzymes: Recent Labs  Lab 12/07/18 0608  TROPONINI <0.03   BNP (last 3 results) No results for input(s): PROBNP in the last 8760 hours. HbA1C: No results for input(s):  HGBA1C in the last 72 hours. CBG: Recent Labs  Lab 12/07/18 2019 12/08/18 0037 12/08/18 0256 12/08/18 0726  GLUCAP 272* 170* 200* 107*   Lipid Profile: No results for input(s): CHOL, HDL, LDLCALC, TRIG, CHOLHDL, LDLDIRECT in the last 72 hours. Thyroid Function Tests: No results for input(s): TSH, T4TOTAL, FREET4, T3FREE, THYROIDAB in the last 72 hours. Anemia Panel: No results for input(s): VITAMINB12, FOLATE, FERRITIN, TIBC, IRON, RETICCTPCT in the last 72 hours. Sepsis Labs: Recent Labs  Lab 12/08/18 [id number]  LATICACIDVEN 1.6    Recent Results (from the past 240 hour(s))  Urine Culture     Status: None   Collection Time: 12/07/18  4:38 AM  Result Value Ref Range Status   Specimen Description   Final    URINE, CLEAN CATCH Performed at [facility], [address] [place], [place] [id number]    Special Requests   Final    NONE Performed at [facility], [address]  [place], [place] [address]    Culture   Final    NO GROWTH Performed at [facility], [address] [place], [place] [address]    Report Status 12/08/2018 FINAL  Final  SARS Coronavirus 2 [facility] order, Performed in [facility] hospital lab)     Status: None   Collection Time: 12/07/18  4:45 AM  Result Value Ref Range Status   SARS Coronavirus 2 NEGATIVE NEGATIVE Final    Comment: (NOTE) If result is NEGATIVE SARS-CoV-2 target nucleic acids are NOT DETECTED. The SARS-CoV-2 RNA is generally detectable in upper and lower  respiratory specimens during the acute phase of infection. The lowest  concentration of SARS-CoV-2 viral copies this assay can detect is 250  copies / mL. A negative result does not preclude SARS-CoV-2 infection  and should not be used as the sole basis for treatment or other  patient management decisions.  A negative result may occur with  improper specimen collection / handling, submission of specimen other  than nasopharyngeal swab, presence of viral mutation(s) within the    areas targeted by this assay, and inadequate number of viral copies  (<250 copies / mL). A negative result must be combined with clinical  observations, patient history, and epidemiological information. If result is POSITIVE SARS-CoV-2 target nucleic acids are DETECTED. The SARS-CoV-2 RNA is generally detectable in upper and lower  respiratory specimens dur ing the acute phase of infection.  Positive  results are indicative of active infection with SARS-CoV-2.  Clinical  correlation with patient history and other diagnostic information is  necessary to determine patient infection status.  Positive results do  not rule out bacterial infection or co-infection with other viruses. If result is PRESUMPTIVE POSTIVE SARS-CoV-2 nucleic acids MAY BE PRESENT.   A presumptive positive result was obtained on the submitted specimen  and confirmed on repeat testing.  While 2019 novel coronavirus  (SARS-CoV-2) nucleic acids may be present in the submitted sample  additional confirmatory testing may be necessary for epidemiological  and / or clinical management purposes  to differentiate between  SARS-CoV-2 and other Sarbecovirus currently known to infect humans.  If clinically indicated additional testing with an alternate test  methodology [phone]) is advised. The SARS-CoV-2 RNA is generally  detectable in upper and lower respiratory sp ecimens during the acute  phase of infection. The expected result is Negative. Fact Sheet for Patients:  BoilerBrush.com.cy Fact Sheet for Healthcare Providers: https://pope.com/ This test is not yet approved or cleared by the [place] FDA and has been authorized for detection and/or diagnosis of SARS-CoV-2 by FDA under an Emergency Use Authorization (EUA).  This EUA will remain in effect (meaning this test can be used) for the duration of the COVID-19 declaration under Section 564(b)(1) of the Act, 21  U.S.C. section 360bbb-3(b)(1), unless the authorization is terminated or revoked sooner. Performed at [facility], [address], [place], [place] [address]   Culture, blood (routine x 2)     Status: None (Preliminary result)   Collection Time: 12/07/18  7:41 PM  Result Value Ref Range Status   Specimen Description RIGHT ANTECUBITAL  Final   Special Requests   Final    BOTTLES DRAWN AEROBIC AND ANAEROBIC Blood Culture adequate volume   Culture   Final    NO GROWTH < 12 HOURS Performed at [facility], [address], [place], [place] [address]    Report Status PENDING  Incomplete  Culture, blood (routine x 2)  Status: None (Preliminary result)   Collection Time: 12/07/18  7:41 PM  Result Value Ref Range Status   Specimen Description BLOOD RIGHT FOREARM  Final   Special Requests   Final    BOTTLES DRAWN AEROBIC ONLY Blood Culture adequate volume   Culture   Final    NO GROWTH < 12 HOURS Performed at [facility], [address], [place], [place] [address]    Report Status PENDING  Incomplete         Radiology Studies: Ct Angio Head W Or Wo Contrast  Result Date: 12/07/2018 CLINICAL DATA:  Altered mental status. Severe headache. Hypertension. EXAM: CT HEAD WITHOUT CONTRAST CT ANGIOGRAPHY OF THE HEAD AND NECK TECHNIQUE: Contiguous axial images were obtained from the base of the skull through the vertex without intravenous contrast. Multidetector CT imaging of the head and neck was performed using the standard protocol during bolus administration of intravenous contrast. Multiplanar CT image reconstructions and MIPs were obtained to evaluate the vascular anatomy. Carotid stenosis measurements (when applicable) are obtained utilizing NASCET criteria, using the distal internal carotid diameter as the denominator. CONTRAST:  OMNIPAQUE IOHEXOL 350 MG/ML SOLN COMPARISON:  Chest CT 05/30/2015 FINDINGS: CT HEAD FINDINGS Brain: There is no mass, hemorrhage or extra-axial collection.  There is generalized atrophy without lobar predilection. There is hypoattenuation of the periventricular white matter, most commonly indicating chronic ischemic microangiopathy. Skull: The visualized skull base, calvarium and extracranial soft tissues are normal. Sinuses/Orbits: No fluid levels or advanced mucosal thickening of the visualized paranasal sinuses. No mastoid or middle ear effusion. The orbits are normal. CTA NECK FINDINGS SKELETON: There is moderate-to-severe spinal canal stenosis at C5-6 due to bulky calcifications along the posterior longitudinal ligament. OTHER NECK: Multiple enlarged right mediastinal lymph nodes measuring up to 10 mm. There are multiple subcentimeter hypodense nodules of thyroid gland. UPPER CHEST: No pneumothorax or pleural effusion. No nodules or masses. AORTIC ARCH: There is mild calcific atherosclerosis of the aortic arch. There is no aneurysm, dissection or hemodynamically significant stenosis of the visualized ascending aorta and aortic arch. Conventional 3 vessel aortic branching pattern. The visualized proximal subclavian arteries are widely patent. RIGHT CAROTID SYSTEM: --Common carotid artery: Widely patent origin without common carotid artery dissection or aneurysm. --Internal carotid artery: No dissection, occlusion or aneurysm. Mild atherosclerotic calcification at the carotid bifurcation without hemodynamically significant stenosis. --External carotid artery: No acute abnormality. LEFT CAROTID SYSTEM: --Common carotid artery: Widely patent origin without common carotid artery dissection or aneurysm. --Internal carotid artery: No dissection, occlusion or aneurysm. Mild atherosclerotic calcification at the carotid bifurcation without hemodynamically significant stenosis. --External carotid artery: No acute abnormality. VERTEBRAL ARTERIES: Right dominant configuration. Both origins are normal. No dissection, occlusion or flow-limiting stenosis to the skull base. CTA  HEAD FINDINGS POSTERIOR CIRCULATION: --Vertebral arteries: Moderate atherosclerotic calcification of both V4 segments. --Posterior inferior cerebellar arteries (PICA): Patent origins from the vertebral arteries. --Anterior inferior cerebellar arteries (AICA): Patent origins from the basilar artery. --Basilar artery: Normal. --Superior cerebellar arteries: Normal. --Posterior cerebral arteries (PCA): Normal. Both originate from the basilar artery. Posterior communicating arteries (p-comm) are diminutive or absent. ANTERIOR CIRCULATION: --Intracranial internal carotid arteries: Atherosclerotic calcification of the internal carotid arteries at the skull base without hemodynamically significant stenosis. --Anterior cerebral arteries (ACA): Normal. Hypoplastic right A1 segment, normal variant. --Middle cerebral arteries (MCA): Normal. VENOUS SINUSES: As permitted by contrast timing, patent. ANATOMIC VARIANTS: None Review of the MIP images confirms the above findings. IMPRESSION: 1. No emergent large vessel occlusion or high-grade stenosis.  2. Upper mediastinal lymphadenopathy, unchanged compared to 05/30/2015. 3. Moderate-to-severe spinal canal stenosis at C5-6 due to posterior longitudinal ligament calcification. Electronically Signed   By: Deatra Robinson M.D.   On: 12/07/2018 03:08   Ct Head Wo Contrast  Result Date: 12/07/2018 CLINICAL DATA:  Altered mental status. Severe headache. Hypertension. EXAM: CT HEAD WITHOUT CONTRAST CT ANGIOGRAPHY OF THE HEAD AND NECK TECHNIQUE: Contiguous axial images were obtained from the base of the skull through the vertex without intravenous contrast. Multidetector CT imaging of the head and neck was performed using the standard protocol during bolus administration of intravenous contrast. Multiplanar CT image reconstructions and MIPs were obtained to evaluate the vascular anatomy. Carotid stenosis measurements (when applicable) are obtained utilizing NASCET criteria, using the  distal internal carotid diameter as the denominator. CONTRAST:  OMNIPAQUE IOHEXOL 350 MG/ML SOLN COMPARISON:  Chest CT 05/30/2015 FINDINGS: CT HEAD FINDINGS Brain: There is no mass, hemorrhage or extra-axial collection. There is generalized atrophy without lobar predilection. There is hypoattenuation of the periventricular white matter, most commonly indicating chronic ischemic microangiopathy. Skull: The visualized skull base, calvarium and extracranial soft tissues are normal. Sinuses/Orbits: No fluid levels or advanced mucosal thickening of the visualized paranasal sinuses. No mastoid or middle ear effusion. The orbits are normal. CTA NECK FINDINGS SKELETON: There is moderate-to-severe spinal canal stenosis at C5-6 due to bulky calcifications along the posterior longitudinal ligament. OTHER NECK: Multiple enlarged right mediastinal lymph nodes measuring up to 10 mm. There are multiple subcentimeter hypodense nodules of thyroid gland. UPPER CHEST: No pneumothorax or pleural effusion. No nodules or masses. AORTIC ARCH: There is mild calcific atherosclerosis of the aortic arch. There is no aneurysm, dissection or hemodynamically significant stenosis of the visualized ascending aorta and aortic arch. Conventional 3 vessel aortic branching pattern. The visualized proximal subclavian arteries are widely patent. RIGHT CAROTID SYSTEM: --Common carotid artery: Widely patent origin without common carotid artery dissection or aneurysm. --Internal carotid artery: No dissection, occlusion or aneurysm. Mild atherosclerotic calcification at the carotid bifurcation without hemodynamically significant stenosis. --External carotid artery: No acute abnormality. LEFT CAROTID SYSTEM: --Common carotid artery: Widely patent origin without common carotid artery dissection or aneurysm. --Internal carotid artery: No dissection, occlusion or aneurysm. Mild atherosclerotic calcification at the carotid bifurcation without  hemodynamically significant stenosis. --External carotid artery: No acute abnormality. VERTEBRAL ARTERIES: Right dominant configuration. Both origins are normal. No dissection, occlusion or flow-limiting stenosis to the skull base. CTA HEAD FINDINGS POSTERIOR CIRCULATION: --Vertebral arteries: Moderate atherosclerotic calcification of both V4 segments. --Posterior inferior cerebellar arteries (PICA): Patent origins from the vertebral arteries. --Anterior inferior cerebellar arteries (AICA): Patent origins from the basilar artery. --Basilar artery: Normal. --Superior cerebellar arteries: Normal. --Posterior cerebral arteries (PCA): Normal. Both originate from the basilar artery. Posterior communicating arteries (p-comm) are diminutive or absent. ANTERIOR CIRCULATION: --Intracranial internal carotid arteries: Atherosclerotic calcification of the internal carotid arteries at the skull base without hemodynamically significant stenosis. --Anterior cerebral arteries (ACA): Normal. Hypoplastic right A1 segment, normal variant. --Middle cerebral arteries (MCA): Normal. VENOUS SINUSES: As permitted by contrast timing, patent. ANATOMIC VARIANTS: None Review of the MIP images confirms the above findings. IMPRESSION: 1. No emergent large vessel occlusion or high-grade stenosis. 2. Upper mediastinal lymphadenopathy, unchanged compared to 05/30/2015. 3. Moderate-to-severe spinal canal stenosis at C5-6 due to posterior longitudinal ligament calcification. Electronically Signed   By: Deatra Robinson M.D.   On: 12/07/2018 03:08   Ct Angio Neck W And/or Wo Contrast  Result Date: 12/07/2018 CLINICAL DATA:  Altered mental status.  Severe headache. Hypertension. EXAM: CT HEAD WITHOUT CONTRAST CT ANGIOGRAPHY OF THE HEAD AND NECK TECHNIQUE: Contiguous axial images were obtained from the base of the skull through the vertex without intravenous contrast. Multidetector CT imaging of the head and neck was performed using the standard  protocol during bolus administration of intravenous contrast. Multiplanar CT image reconstructions and MIPs were obtained to evaluate the vascular anatomy. Carotid stenosis measurements (when applicable) are obtained utilizing NASCET criteria, using the distal internal carotid diameter as the denominator. CONTRAST:  OMNIPAQUE IOHEXOL 350 MG/ML SOLN COMPARISON:  Chest CT 05/30/2015 FINDINGS: CT HEAD FINDINGS Brain: There is no mass, hemorrhage or extra-axial collection. There is generalized atrophy without lobar predilection. There is hypoattenuation of the periventricular white matter, most commonly indicating chronic ischemic microangiopathy. Skull: The visualized skull base, calvarium and extracranial soft tissues are normal. Sinuses/Orbits: No fluid levels or advanced mucosal thickening of the visualized paranasal sinuses. No mastoid or middle ear effusion. The orbits are normal. CTA NECK FINDINGS SKELETON: There is moderate-to-severe spinal canal stenosis at C5-6 due to bulky calcifications along the posterior longitudinal ligament. OTHER NECK: Multiple enlarged right mediastinal lymph nodes measuring up to 10 mm. There are multiple subcentimeter hypodense nodules of thyroid gland. UPPER CHEST: No pneumothorax or pleural effusion. No nodules or masses. AORTIC ARCH: There is mild calcific atherosclerosis of the aortic arch. There is no aneurysm, dissection or hemodynamically significant stenosis of the visualized ascending aorta and aortic arch. Conventional 3 vessel aortic branching pattern. The visualized proximal subclavian arteries are widely patent. RIGHT CAROTID SYSTEM: --Common carotid artery: Widely patent origin without common carotid artery dissection or aneurysm. --Internal carotid artery: No dissection, occlusion or aneurysm. Mild atherosclerotic calcification at the carotid bifurcation without hemodynamically significant stenosis. --External carotid artery: No acute abnormality. LEFT CAROTID  SYSTEM: --Common carotid artery: Widely patent origin without common carotid artery dissection or aneurysm. --Internal carotid artery: No dissection, occlusion or aneurysm. Mild atherosclerotic calcification at the carotid bifurcation without hemodynamically significant stenosis. --External carotid artery: No acute abnormality. VERTEBRAL ARTERIES: Right dominant configuration. Both origins are normal. No dissection, occlusion or flow-limiting stenosis to the skull base. CTA HEAD FINDINGS POSTERIOR CIRCULATION: --Vertebral arteries: Moderate atherosclerotic calcification of both V4 segments. --Posterior inferior cerebellar arteries (PICA): Patent origins from the vertebral arteries. --Anterior inferior cerebellar arteries (AICA): Patent origins from the basilar artery. --Basilar artery: Normal. --Superior cerebellar arteries: Normal. --Posterior cerebral arteries (PCA): Normal. Both originate from the basilar artery. Posterior communicating arteries (p-comm) are diminutive or absent. ANTERIOR CIRCULATION: --Intracranial internal carotid arteries: Atherosclerotic calcification of the internal carotid arteries at the skull base without hemodynamically significant stenosis. --Anterior cerebral arteries (ACA): Normal. Hypoplastic right A1 segment, normal variant. --Middle cerebral arteries (MCA): Normal. VENOUS SINUSES: As permitted by contrast timing, patent. ANATOMIC VARIANTS: None Review of the MIP images confirms the above findings. IMPRESSION: 1. No emergent large vessel occlusion or high-grade stenosis. 2. Upper mediastinal lymphadenopathy, unchanged compared to 05/30/2015. 3. Moderate-to-severe spinal canal stenosis at C5-6 due to posterior longitudinal ligament calcification. Electronically Signed   By: Deatra Robinson M.D.   On: 12/07/2018 03:08   Dg Chest Port 1 View  Result Date: 12/07/2018 CLINICAL DATA:  Fever. EXAM: PORTABLE CHEST 1 VIEW COMPARISON:  CT chest and PA and lateral chest 05/29/2017. FINDINGS:  The lungs are clear. There is cardiomegaly. No pneumothorax or pleural fluid. No acute or focal bony abnormality. IMPRESSION: Cardiomegaly without acute disease. Electronically Signed   By: Drusilla Kanner M.D.   On: 12/07/2018 19:55  Scheduled Meds:  amLODipine  5 mg Oral Daily   insulin aspart  0-15 Units Subcutaneous Q4H   isosorbide mononitrate  30 mg Oral Daily   metoprolol tartrate  50 mg Oral BID   pantoprazole (PROTONIX) IV  40 mg Intravenous Q12H   Continuous Infusions:  lactated ringers 75 mL/hr at 12/08/18 0357   piperacillin-tazobactam (ZOSYN)  IV 3.375 g (12/08/18 0627)     LOS: 1 day    Time spent: 30 minutes    Adaisha Campise Hoover Brunette, DO [facility] Pager [phone]  If 7PM-7AM, please contact night-coverage [email or web address] Password [facility] 12/08/2018, 10:45 AM

## 2018-12-08 NOTE — Progress Notes (Signed)
Subjective: More alert today. Answers to name. Simple yes and no responses. Denies abdominal pain, nausea, vomiting. Oriented to self only. No further hematemesis per nursing staff.   Objective: Vital signs in last 24 hours: Temp:  [99.3 F (37.4 C)-101.4 F (38.6 C)] 100.1 F (37.8 C) (05/12 0439) Pulse Rate:  [97-151] 108 (05/12 0439) Resp:  [18-20] 18 (05/12 0439) BP: (114-211)/(88-179) 178/93 (05/12 0439) SpO2:  [92 %-100 %] 97 % (05/12 0807) Last BM Date: (UTA) General:   Resting in bed with eyes closed but responds to name. Oriented to person only.  Lungs: Clear to auscultation bilaterally, diminished bases, limited exam as patient not cooperative with positioning Abdomen:  Bowel sounds present, soft, non-tender, non-distended.  Extremities:  Without edema. Psych:  Flat affect  Intake/Output from previous day: 05/11 0701 - 05/12 0700 In: 882.4 [I.V.:832.4; IV Piggyback:50] Out: -  Intake/Output this shift: No intake/output data recorded.  Lab Results: Recent Labs    12/07/18 0149 12/07/18 1054 12/08/18 0613  WBC 13.8*  --  18.8*  HGB 12.8 12.8 12.2  HCT 38.4 38.4 37.2  PLT 350  --  343   BMET Recent Labs    12/07/18 0149 12/08/18 0613  NA 136 137  K 4.0 3.5  CL 98 101  CO2 26 24  GLUCOSE 323* 105*  BUN 16 16  CREATININE 1.10* 1.15*  CALCIUM 9.0 8.7*   LFT Recent Labs    12/07/18 0149 12/08/18 0613  PROT 6.5 6.1*  ALBUMIN 3.4* 3.3*  AST 13* 24  ALT 9 12  ALKPHOS 80 66  BILITOT 1.0 1.6*    Studies/Results: Ct Angio Head W Or Wo Contrast  Result Date: 12/07/2018 CLINICAL DATA:  Altered mental status. Severe headache. Hypertension. EXAM: CT HEAD WITHOUT CONTRAST CT ANGIOGRAPHY OF THE HEAD AND NECK TECHNIQUE: Contiguous axial images were obtained from the base of the skull through the vertex without intravenous contrast. Multidetector CT imaging of the head and neck was performed using the standard protocol during bolus administration of  intravenous contrast. Multiplanar CT image reconstructions and MIPs were obtained to evaluate the vascular anatomy. Carotid stenosis measurements (when applicable) are obtained utilizing NASCET criteria, using the distal internal carotid diameter as the denominator. CONTRAST:  OMNIPAQUE IOHEXOL 350 MG/ML SOLN COMPARISON:  Chest CT 05/30/2015 FINDINGS: CT HEAD FINDINGS Brain: There is no mass, hemorrhage or extra-axial collection. There is generalized atrophy without lobar predilection. There is hypoattenuation of the periventricular white matter, most commonly indicating chronic ischemic microangiopathy. Skull: The visualized skull base, calvarium and extracranial soft tissues are normal. Sinuses/Orbits: No fluid levels or advanced mucosal thickening of the visualized paranasal sinuses. No mastoid or middle ear effusion. The orbits are normal. CTA NECK FINDINGS SKELETON: There is moderate-to-severe spinal canal stenosis at C5-6 due to bulky calcifications along the posterior longitudinal ligament. OTHER NECK: Multiple enlarged right mediastinal lymph nodes measuring up to 10 mm. There are multiple subcentimeter hypodense nodules of thyroid gland. UPPER CHEST: No pneumothorax or pleural effusion. No nodules or masses. AORTIC ARCH: There is mild calcific atherosclerosis of the aortic arch. There is no aneurysm, dissection or hemodynamically significant stenosis of the visualized ascending aorta and aortic arch. Conventional 3 vessel aortic branching pattern. The visualized proximal subclavian arteries are widely patent. RIGHT CAROTID SYSTEM: --Common carotid artery: Widely patent origin without common carotid artery dissection or aneurysm. --Internal carotid artery: No dissection, occlusion or aneurysm. Mild atherosclerotic calcification at the carotid bifurcation without hemodynamically significant stenosis. --External carotid artery:  No acute abnormality. LEFT CAROTID SYSTEM: --Common carotid artery: Widely  patent origin without common carotid artery dissection or aneurysm. --Internal carotid artery: No dissection, occlusion or aneurysm. Mild atherosclerotic calcification at the carotid bifurcation without hemodynamically significant stenosis. --External carotid artery: No acute abnormality. VERTEBRAL ARTERIES: Right dominant configuration. Both origins are normal. No dissection, occlusion or flow-limiting stenosis to the skull base. CTA HEAD FINDINGS POSTERIOR CIRCULATION: --Vertebral arteries: Moderate atherosclerotic calcification of both V4 segments. --Posterior inferior cerebellar arteries (PICA): Patent origins from the vertebral arteries. --Anterior inferior cerebellar arteries (AICA): Patent origins from the basilar artery. --Basilar artery: Normal. --Superior cerebellar arteries: Normal. --Posterior cerebral arteries (PCA): Normal. Both originate from the basilar artery. Posterior communicating arteries (p-comm) are diminutive or absent. ANTERIOR CIRCULATION: --Intracranial internal carotid arteries: Atherosclerotic calcification of the internal carotid arteries at the skull base without hemodynamically significant stenosis. --Anterior cerebral arteries (ACA): Normal. Hypoplastic right A1 segment, normal variant. --Middle cerebral arteries (MCA): Normal. VENOUS SINUSES: As permitted by contrast timing, patent. ANATOMIC VARIANTS: None Review of the MIP images confirms the above findings. IMPRESSION: 1. No emergent large vessel occlusion or high-grade stenosis. 2. Upper mediastinal lymphadenopathy, unchanged compared to 05/30/2015. 3. Moderate-to-severe spinal canal stenosis at C5-6 due to posterior longitudinal ligament calcification. Electronically Signed   By: Deatra Robinson M.D.   On: 12/07/2018 03:08   Ct Head Wo Contrast  Result Date: 12/07/2018 CLINICAL DATA:  Altered mental status. Severe headache. Hypertension. EXAM: CT HEAD WITHOUT CONTRAST CT ANGIOGRAPHY OF THE HEAD AND NECK TECHNIQUE: Contiguous  axial images were obtained from the base of the skull through the vertex without intravenous contrast. Multidetector CT imaging of the head and neck was performed using the standard protocol during bolus administration of intravenous contrast. Multiplanar CT image reconstructions and MIPs were obtained to evaluate the vascular anatomy. Carotid stenosis measurements (when applicable) are obtained utilizing NASCET criteria, using the distal internal carotid diameter as the denominator. CONTRAST:  OMNIPAQUE IOHEXOL 350 MG/ML SOLN COMPARISON:  Chest CT 05/30/2015 FINDINGS: CT HEAD FINDINGS Brain: There is no mass, hemorrhage or extra-axial collection. There is generalized atrophy without lobar predilection. There is hypoattenuation of the periventricular white matter, most commonly indicating chronic ischemic microangiopathy. Skull: The visualized skull base, calvarium and extracranial soft tissues are normal. Sinuses/Orbits: No fluid levels or advanced mucosal thickening of the visualized paranasal sinuses. No mastoid or middle ear effusion. The orbits are normal. CTA NECK FINDINGS SKELETON: There is moderate-to-severe spinal canal stenosis at C5-6 due to bulky calcifications along the posterior longitudinal ligament. OTHER NECK: Multiple enlarged right mediastinal lymph nodes measuring up to 10 mm. There are multiple subcentimeter hypodense nodules of thyroid gland. UPPER CHEST: No pneumothorax or pleural effusion. No nodules or masses. AORTIC ARCH: There is mild calcific atherosclerosis of the aortic arch. There is no aneurysm, dissection or hemodynamically significant stenosis of the visualized ascending aorta and aortic arch. Conventional 3 vessel aortic branching pattern. The visualized proximal subclavian arteries are widely patent. RIGHT CAROTID SYSTEM: --Common carotid artery: Widely patent origin without common carotid artery dissection or aneurysm. --Internal carotid artery: No dissection, occlusion or  aneurysm. Mild atherosclerotic calcification at the carotid bifurcation without hemodynamically significant stenosis. --External carotid artery: No acute abnormality. LEFT CAROTID SYSTEM: --Common carotid artery: Widely patent origin without common carotid artery dissection or aneurysm. --Internal carotid artery: No dissection, occlusion or aneurysm. Mild atherosclerotic calcification at the carotid bifurcation without hemodynamically significant stenosis. --External carotid artery: No acute abnormality. VERTEBRAL ARTERIES: Right dominant configuration. Both origins are normal.  No dissection, occlusion or flow-limiting stenosis to the skull base. CTA HEAD FINDINGS POSTERIOR CIRCULATION: --Vertebral arteries: Moderate atherosclerotic calcification of both V4 segments. --Posterior inferior cerebellar arteries (PICA): Patent origins from the vertebral arteries. --Anterior inferior cerebellar arteries (AICA): Patent origins from the basilar artery. --Basilar artery: Normal. --Superior cerebellar arteries: Normal. --Posterior cerebral arteries (PCA): Normal. Both originate from the basilar artery. Posterior communicating arteries (p-comm) are diminutive or absent. ANTERIOR CIRCULATION: --Intracranial internal carotid arteries: Atherosclerotic calcification of the internal carotid arteries at the skull base without hemodynamically significant stenosis. --Anterior cerebral arteries (ACA): Normal. Hypoplastic right A1 segment, normal variant. --Middle cerebral arteries (MCA): Normal. VENOUS SINUSES: As permitted by contrast timing, patent. ANATOMIC VARIANTS: None Review of the MIP images confirms the above findings. IMPRESSION: 1. No emergent large vessel occlusion or high-grade stenosis. 2. Upper mediastinal lymphadenopathy, unchanged compared to 05/30/2015. 3. Moderate-to-severe spinal canal stenosis at C5-6 due to posterior longitudinal ligament calcification. Electronically Signed   By: Deatra RobinsonKevin  Herman M.D.   On:  12/07/2018 03:08   Ct Angio Neck W And/or Wo Contrast  Result Date: 12/07/2018 CLINICAL DATA:  Altered mental status. Severe headache. Hypertension. EXAM: CT HEAD WITHOUT CONTRAST CT ANGIOGRAPHY OF THE HEAD AND NECK TECHNIQUE: Contiguous axial images were obtained from the base of the skull through the vertex without intravenous contrast. Multidetector CT imaging of the head and neck was performed using the standard protocol during bolus administration of intravenous contrast. Multiplanar CT image reconstructions and MIPs were obtained to evaluate the vascular anatomy. Carotid stenosis measurements (when applicable) are obtained utilizing NASCET criteria, using the distal internal carotid diameter as the denominator. CONTRAST:  100mL OMNIPAQUE IOHEXOL 350 MG/ML SOLN COMPARISON:  Chest CT 05/30/2015 FINDINGS: CT HEAD FINDINGS Brain: There is no mass, hemorrhage or extra-axial collection. There is generalized atrophy without lobar predilection. There is hypoattenuation of the periventricular white matter, most commonly indicating chronic ischemic microangiopathy. Skull: The visualized skull base, calvarium and extracranial soft tissues are normal. Sinuses/Orbits: No fluid levels or advanced mucosal thickening of the visualized paranasal sinuses. No mastoid or middle ear effusion. The orbits are normal. CTA NECK FINDINGS SKELETON: There is moderate-to-severe spinal canal stenosis at C5-6 due to bulky calcifications along the posterior longitudinal ligament. OTHER NECK: Multiple enlarged right mediastinal lymph nodes measuring up to 10 mm. There are multiple subcentimeter hypodense nodules of thyroid gland. UPPER CHEST: No pneumothorax or pleural effusion. No nodules or masses. AORTIC ARCH: There is mild calcific atherosclerosis of the aortic arch. There is no aneurysm, dissection or hemodynamically significant stenosis of the visualized ascending aorta and aortic arch. Conventional 3 vessel aortic branching  pattern. The visualized proximal subclavian arteries are widely patent. RIGHT CAROTID SYSTEM: --Common carotid artery: Widely patent origin without common carotid artery dissection or aneurysm. --Internal carotid artery: No dissection, occlusion or aneurysm. Mild atherosclerotic calcification at the carotid bifurcation without hemodynamically significant stenosis. --External carotid artery: No acute abnormality. LEFT CAROTID SYSTEM: --Common carotid artery: Widely patent origin without common carotid artery dissection or aneurysm. --Internal carotid artery: No dissection, occlusion or aneurysm. Mild atherosclerotic calcification at the carotid bifurcation without hemodynamically significant stenosis. --External carotid artery: No acute abnormality. VERTEBRAL ARTERIES: Right dominant configuration. Both origins are normal. No dissection, occlusion or flow-limiting stenosis to the skull base. CTA HEAD FINDINGS POSTERIOR CIRCULATION: --Vertebral arteries: Moderate atherosclerotic calcification of both V4 segments. --Posterior inferior cerebellar arteries (PICA): Patent origins from the vertebral arteries. --Anterior inferior cerebellar arteries (AICA): Patent origins from the basilar artery. --Basilar artery: Normal. --Superior cerebellar  arteries: Normal. --Posterior cerebral arteries (PCA): Normal. Both originate from the basilar artery. Posterior communicating arteries (p-comm) are diminutive or absent. ANTERIOR CIRCULATION: --Intracranial internal carotid arteries: Atherosclerotic calcification of the internal carotid arteries at the skull base without hemodynamically significant stenosis. --Anterior cerebral arteries (ACA): Normal. Hypoplastic right A1 segment, normal variant. --Middle cerebral arteries (MCA): Normal. VENOUS SINUSES: As permitted by contrast timing, patent. ANATOMIC VARIANTS: None Review of the MIP images confirms the above findings. IMPRESSION: 1. No emergent large vessel occlusion or high-grade  stenosis. 2. Upper mediastinal lymphadenopathy, unchanged compared to 05/30/2015. 3. Moderate-to-severe spinal canal stenosis at C5-6 due to posterior longitudinal ligament calcification. Electronically Signed   By: Deatra Robinson M.D.   On: 12/07/2018 03:08   Dg Chest Port 1 View  Result Date: 12/07/2018 CLINICAL DATA:  Fever. EXAM: PORTABLE CHEST 1 VIEW COMPARISON:  CT chest and PA and lateral chest 05/29/2017. FINDINGS: The lungs are clear. There is cardiomegaly. No pneumothorax or pleural fluid. No acute or focal bony abnormality. IMPRESSION: Cardiomegaly without acute disease. Electronically Signed   By: Drusilla Kanner M.D.   On: 12/07/2018 19:55    Assessment: 84 year old female admitted with hypertensive urgency, acute headache, changes in mental status. Evidence for hematemesis on two occasions during admission, without any further overt GI bleeding since yesterday morning. Hgb remaining stable. Febrile yesterday evening with concerns for aspiration pneumonia, now on antibiotic therapy. Clinically, she is more alert today. Will postpone EGD today as she is stable from a GI standpoint, reassessing tomorrow. May have clear liquids today with assistance and strict aspiration precautions if remaining alert. NPO after midnight.   Plan: Clear liquids with assistance, aspiration precautions NPO after midnight Continue PPI BID Will reassess tomorrow for EGD with Dr. Jena Gauss I updated Desma Maxim, grandson and [place] regarding plan of care   Gelene Mink, PhD, ANP-BC [facility]    LOS: 1 day    12/08/2018, 9:05 AM

## 2018-12-08 NOTE — Progress Notes (Signed)
Patient drowsy and easily aroused. No oral intake for shift. Attempted to provide oral care, pt refused.

## 2018-12-09 DIAGNOSIS — R10819 Abdominal tenderness, unspecified site: Secondary | ICD-10-CM

## 2018-12-09 DIAGNOSIS — K920 Hematemesis: Secondary | ICD-10-CM

## 2018-12-09 DIAGNOSIS — N390 Urinary tract infection, site not specified: Secondary | ICD-10-CM

## 2018-12-09 DIAGNOSIS — J690 Pneumonitis due to inhalation of food and vomit: Secondary | ICD-10-CM

## 2018-12-09 DIAGNOSIS — D72829 Elevated white blood cell count, unspecified: Secondary | ICD-10-CM

## 2018-12-09 LAB — COMPREHENSIVE METABOLIC PANEL
ALT: 14 U/L (ref 0–44)
AST: 23 U/L (ref 15–41)
Albumin: 3.1 g/dL — ABNORMAL LOW (ref 3.5–5.0)
Alkaline Phosphatase: 61 U/L (ref 38–126)
Anion gap: 17 — ABNORMAL HIGH (ref 5–15)
BUN: 22 mg/dL (ref 8–23)
CO2: 17 mmol/L — ABNORMAL LOW (ref 22–32)
Calcium: 8.3 mg/dL — ABNORMAL LOW (ref 8.9–10.3)
Chloride: 104 mmol/L (ref 98–111)
Creatinine, Ser: 1.62 mg/dL — ABNORMAL HIGH (ref 0.44–1.00)
GFR calc Af Amer: 34 mL/min — ABNORMAL LOW (ref >60)
GFR calc non Af Amer: 29 mL/min — ABNORMAL LOW (ref >60)
Glucose, Bld: 181 mg/dL — ABNORMAL HIGH (ref 70–99)
Potassium: 4.1 mmol/L (ref 3.5–5.1)
Sodium: 138 mmol/L (ref 135–145)
Total Bilirubin: 2 mg/dL — ABNORMAL HIGH (ref 0.3–1.2)
Total Protein: 6.1 g/dL — ABNORMAL LOW (ref 6.5–8.1)

## 2018-12-09 LAB — GLUCOSE, CAPILLARY
Glucose-Capillary: 125 mg/dL — ABNORMAL HIGH (ref 70–99)
Glucose-Capillary: 126 mg/dL — ABNORMAL HIGH (ref 70–99)
Glucose-Capillary: 138 mg/dL — ABNORMAL HIGH (ref 70–99)
Glucose-Capillary: 155 mg/dL — ABNORMAL HIGH (ref 70–99)
Glucose-Capillary: 163 mg/dL — ABNORMAL HIGH (ref 70–99)
Glucose-Capillary: 196 mg/dL — ABNORMAL HIGH (ref 70–99)

## 2018-12-09 LAB — LACTIC ACID, PLASMA: Lactic Acid, Venous: 1.5 mmol/L (ref 0.5–1.9)

## 2018-12-09 LAB — MRSA PCR SCREENING: MRSA by PCR: POSITIVE — AB

## 2018-12-09 LAB — CBC
HCT: 38.9 % (ref 36.0–46.0)
Hemoglobin: 12.9 g/dL (ref 12.0–15.0)
MCH: 32 pg (ref 26.0–34.0)
MCHC: 33.2 g/dL (ref 30.0–36.0)
MCV: 96.5 fL (ref 80.0–100.0)
Platelets: 344 10*3/uL (ref 150–400)
RBC: 4.03 MIL/uL (ref 3.87–5.11)
RDW: 13.8 % (ref 11.5–15.5)
WBC: 19.5 10*3/uL — ABNORMAL HIGH (ref 4.0–10.5)
nRBC: 0 % (ref 0.0–0.2)

## 2018-12-09 MED ORDER — INSULIN ASPART 100 UNIT/ML ~~LOC~~ SOLN
0.0000 [IU] | Freq: Three times a day (TID) | SUBCUTANEOUS | Status: DC
  Administered 2018-12-09: 1 [IU] via SUBCUTANEOUS
  Administered 2018-12-10: 5 [IU] via SUBCUTANEOUS
  Administered 2018-12-10: 2 [IU] via SUBCUTANEOUS
  Administered 2018-12-10 – 2018-12-11 (×4): 3 [IU] via SUBCUTANEOUS
  Administered 2018-12-12: 2 [IU] via SUBCUTANEOUS
  Administered 2018-12-12: 3 [IU] via SUBCUTANEOUS
  Administered 2018-12-12: 2 [IU] via SUBCUTANEOUS

## 2018-12-09 MED ORDER — PANTOPRAZOLE SODIUM 40 MG IV SOLR
40.0000 mg | INTRAVENOUS | Status: DC
  Administered 2018-12-09 – 2018-12-10 (×2): 40 mg via INTRAVENOUS
  Filled 2018-12-09 (×2): qty 40

## 2018-12-09 NOTE — Progress Notes (Signed)
Bladder scanned patient because she had not urinated since I in/out cathed her at 1000. She only had 180 mls. According to policy she did not have enough urine to in/out her again at this time.

## 2018-12-09 NOTE — Progress Notes (Signed)
Bladder scanned patient. Got a reading of 862 mls. In and out cathed patient and got 1200 mls of dark [name] Mueller urine with sediment.

## 2018-12-09 NOTE — Progress Notes (Signed)
PROGRESS NOTE    [name] Mueller   JYN:[id number]  DOB: 05-17-1935  DOA: 12/07/2018 PCP: Elfredia NevinsFusco, Lawrence, MD   Brief Narrative:  [name] Mueller is a84 y.o.female,w hypertension, hyperlipidemia, dm2, CAD, pulmonary hypertension, anemia, adrenal insufficiency, apparently presents with headache (severe). Pt was brought to ER and found to have bp markedly elevated.   Patient was admitted with severe headache likely related to hypertensive urgency.  She was noted to have some severe anxiety along with epigastric abdominal pain and profound hematemesis with blood clots noted on the morning of 5/11.  Blood pressures continue to remain somewhat elevated, but she has been started on a clear liquid diet by GI on 5/12 and will resume some of her home blood pressure medications today while maintaining on IV fluid.  Zosyn was started as well on 5/11 due to concerns for aspiration pneumonia and she is noted to have leukocytosis and ongoing low-grade temperatures.    Subjective: She has no complaints.     Assessment & Plan:   Principal Problem:   Hematemesis -  EGD pending - Hb is stable- no further vomiting - cont LR at 100 cc/hr, PPI- started on clears today  Active Problems: Urinary retention - after I and O cath, very dark brown urine obtained- have asked for UA which is still pending- U culture sent - follow leukocytosis- cont Zosyn - urine culture from 5/11 is negative - may need foley, follow bladder scan  Suspected aspiration pneumonia - cont Zosyn  Acute encephalopathy - cont treatment of underlying infection     Diabetes mellitus without complication  - change SSI to with meals    Hypertensive urgency,   Headache - has resolved      Time spent in minutes: 35 DVT prophylaxis: SCD Code Status: Full code Family Communication:  Disposition Plan: home when stable Consultants:   GI Procedures:     Antimicrobials:  Anti-infectives (From admission, onward)   Start      Dose/Rate Route Frequency Ordered Stop   12/07/18 2000  piperacillin-tazobactam (ZOSYN) IVPB 3.375 g     3.375 g 12.5 mL/hr over 240 Minutes Intravenous Every 8 hours 12/07/18 1939         Objective: Vitals:   12/09/18 0403 12/09/18 0538 12/09/18 1200 12/09/18 1306  BP: 135/88 (!) 146/109 (!) 155/73 (!) 132/46  Pulse: 98 (!) 111 (!) 103 88  Resp:  20  18  Temp:  99.1 F (37.3 C)  98.2 F (36.8 C)  TempSrc:  Oral    SpO2:  98%  97%  Weight:      Height:        Intake/Output Summary (Last 24 hours) at 12/09/2018 1429 Last data filed at 12/09/2018 1300 Gross per 24 hour  Intake 431.05 ml  Output 1350 ml  Net -918.95 ml   Filed Weights   12/07/18 0125 12/07/18 0848  Weight: 97.1 kg 86.8 kg    Examination: General exam: Appears uncomfortable piror to I and O cath, fell asleep immediately after the cath HEENT: PERRLA, oral mucosa moist, no sclera icterus or thrush Respiratory system: Clear to auscultation. Respiratory effort normal. Cardiovascular system: S1 & S2 heard, RRR.   Gastrointestinal system: Abdomen soft, non-tender, nondistended. Normal bowel sounds. Extremities: No cyanosis, clubbing or edema Skin: No rashes or ulcers  Data Reviewed: I have personally reviewed following labs and imaging studies  CBC: Recent Labs  Lab 12/07/18 0149 12/07/18 1054 12/08/18 0613 12/09/18 0502  WBC 13.8*  --  18.8*  19.5*  NEUTROABS 10.9*  --   --   --   HGB 12.8 12.8 12.2 12.9  HCT 38.4 38.4 37.2 38.9  MCV 95.0  --  95.1 96.5  PLT 350  --  343 344   Basic Metabolic Panel: Recent Labs  Lab 12/07/18 0149 12/08/18 0613 12/09/18 0502  NA 136 137 138  K 4.0 3.5 4.1  CL 98 101 104  CO2 26 24 17*  GLUCOSE 323* 105* 181*  BUN 16 16 22   CREATININE 1.10* 1.15* 1.62*  CALCIUM 9.0 8.7* 8.3*   GFR: Estimated Creatinine Clearance: 31.4 mL/min (A) (by C-G formula based on SCr of 1.62 mg/dL (H)). Liver Function Tests: Recent Labs  Lab 12/07/18 0149 12/08/18 0613  12/09/18 0502  AST 13* 24 23  ALT 9 12 14   ALKPHOS 80 66 61  BILITOT 1.0 1.6* 2.0*  PROT 6.5 6.1* 6.1*  ALBUMIN 3.4* 3.3* 3.1*   No results for input(s): LIPASE, AMYLASE in the last 168 hours. No results for input(s): AMMONIA in the last 168 hours. Coagulation Profile: No results for input(s): INR, PROTIME in the last 168 hours. Cardiac Enzymes: Recent Labs  Lab 12/07/18 0608  TROPONINI <0.03   BNP (last 3 results) No results for input(s): PROBNP in the last 8760 hours. HbA1C: No results for input(s): HGBA1C in the last 72 hours. CBG: Recent Labs  Lab 12/08/18 2026 12/08/18 2358 12/09/18 0411 12/09/18 0731 12/09/18 1118  GLUCAP 134* 155* 163* 196* 126*   Lipid Profile: No results for input(s): CHOL, HDL, LDLCALC, TRIG, CHOLHDL, LDLDIRECT in the last 72 hours. Thyroid Function Tests: No results for input(s): TSH, T4TOTAL, FREET4, T3FREE, THYROIDAB in the last 72 hours. Anemia Panel: No results for input(s): VITAMINB12, FOLATE, FERRITIN, TIBC, IRON, RETICCTPCT in the last 72 hours. Urine analysis:    Component Value Date/Time   COLORURINE STRAW (A) 12/07/2018 0438   APPEARANCEUR CLEAR 12/07/2018 0438   LABSPEC 1.024 12/07/2018 0438   PHURINE 7.0 12/07/2018 0438   GLUCOSEU >=500 (A) 12/07/2018 0438   HGBUR NEGATIVE 12/07/2018 0438   BILIRUBINUR NEGATIVE 12/07/2018 0438   KETONESUR NEGATIVE 12/07/2018 0438   PROTEINUR NEGATIVE 12/07/2018 0438   UROBILINOGEN 0.2 05/31/2015 0045   NITRITE NEGATIVE 12/07/2018 0438   LEUKOCYTESUR NEGATIVE 12/07/2018 0438   Sepsis Labs: @LABRCNTIP (procalcitonin:4,lacticidven:4) ) Recent Results (from the past 240 hour(s))  Urine Culture     Status: None   Collection Time: 12/07/18  4:38 AM  Result Value Ref Range Status   Specimen Description   Final    URINE, CLEAN CATCH Performed at [facility], [address], [place], [place] [address]    Special Requests   Final    NONE Performed at [facility], [address]  [place], [place] [address]    Culture   Final    NO GROWTH Performed at [facility], [address], [place], [place] [address]    Report Status 12/08/2018 FINAL  Final  SARS Coronavirus 2 [facility] order, Performed in [facility] hospital lab)     Status: None   Collection Time: 12/07/18  4:45 AM  Result Value Ref Range Status   SARS Coronavirus 2 NEGATIVE NEGATIVE Final    Comment: (NOTE) If result is NEGATIVE SARS-CoV-2 target nucleic acids are NOT DETECTED. The SARS-CoV-2 RNA is generally detectable in upper and lower  respiratory specimens during the acute phase of infection. The lowest  concentration of SARS-CoV-2 viral copies this assay can detect is 250  copies /  mL. A negative result does not preclude SARS-CoV-2 infection  and should not be used as the sole basis for treatment or other  patient management decisions.  A negative result may occur with  improper specimen collection / handling, submission of specimen other  than nasopharyngeal swab, presence of viral mutation(s) within the  areas targeted by this assay, and inadequate number of viral copies  (<250 copies / mL). A negative result must be combined with clinical  observations, patient history, and epidemiological information. If result is POSITIVE SARS-CoV-2 target nucleic acids are DETECTED. The SARS-CoV-2 RNA is generally detectable in upper and lower  respiratory specimens dur ing the acute phase of infection.  Positive  results are indicative of active infection with SARS-CoV-2.  Clinical  correlation with patient history and other diagnostic information is  necessary to determine patient infection status.  Positive results do  not rule out bacterial infection or co-infection with other viruses. If result is PRESUMPTIVE POSTIVE SARS-CoV-2 nucleic acids MAY BE PRESENT.   A presumptive positive result was obtained on the submitted specimen  and confirmed on repeat testing.  While 2019 novel coronavirus   (SARS-CoV-2) nucleic acids may be present in the submitted sample  additional confirmatory testing may be necessary for epidemiological  and / or clinical management purposes  to differentiate between  SARS-CoV-2 and other Sarbecovirus currently known to infect humans.  If clinically indicated additional testing with an alternate test  methodology [phone]) is advised. The SARS-CoV-2 RNA is generally  detectable in upper and lower respiratory sp ecimens during the acute  phase of infection. The expected result is Negative. Fact Sheet for Patients:  BoilerBrush.com.cy Fact Sheet for Healthcare Providers: https://pope.com/ This test is not yet approved or cleared by the [place] FDA and has been authorized for detection and/or diagnosis of SARS-CoV-2 by FDA under an Emergency Use Authorization (EUA).  This EUA will remain in effect (meaning this test can be used) for the duration of the COVID-19 declaration under Section 564(b)(1) of the Act, 21 U.S.C. section 360bbb-3(b)(1), unless the authorization is terminated or revoked sooner. Performed at [facility], [address] [place], [place] [address]   Culture, blood (routine x 2)     Status: None (Preliminary result)   Collection Time: 12/07/18  7:41 PM  Result Value Ref Range Status   Specimen Description RIGHT ANTECUBITAL  Final   Special Requests   Final    BOTTLES DRAWN AEROBIC AND ANAEROBIC Blood Culture adequate volume   Culture   Final    NO GROWTH 2 DAYS Performed at [facility], [address], [place], [place] [address]    Report Status PENDING  Incomplete  Culture, blood (routine x 2)     Status: None (Preliminary result)   Collection Time: 12/07/18  7:41 PM  Result Value Ref Range Status   Specimen Description BLOOD RIGHT FOREARM  Final   Special Requests   Final    BOTTLES DRAWN AEROBIC ONLY Blood Culture adequate volume   Culture   Final    NO GROWTH 2 DAYS  Performed at [facility], [address] [place], [place] [address]    Report Status PENDING  Incomplete  MRSA PCR Screening     Status: Abnormal   Collection Time: 12/09/18 10:30 AM  Result Value Ref Range Status   MRSA by PCR POSITIVE (A) NEGATIVE Final    Comment:        The GeneXpert MRSA Assay (FDA approved for NASAL specimens only), is  one component of a comprehensive MRSA colonization surveillance program. It is not intended to diagnose MRSA infection nor to guide or monitor treatment for MRSA infections. RESULT CALLED TO, READ BACK BY AND VERIFIED WITH: BROWN S. AT 1322 ON [id number] BY THOMPSON S. Performed at [facility], [address], [place], [place] [address]          Radiology Studies: Dg Chest [facility] 1 View  Result Date: 12/07/2018 CLINICAL DATA:  Fever. EXAM: PORTABLE CHEST 1 VIEW COMPARISON:  CT chest and PA and lateral chest 05/29/2017. FINDINGS: The lungs are clear. There is cardiomegaly. No pneumothorax or pleural fluid. No acute or focal bony abnormality. IMPRESSION: Cardiomegaly without acute disease. Electronically Signed   By: Drusilla Kanner M.D.   On: 12/07/2018 19:55      Scheduled Meds: . amLODipine  5 mg Oral Daily  . escitalopram  10 mg Oral Daily  . hydrALAZINE  10 mg Intravenous Q6H  . insulin aspart  0-15 Units Subcutaneous Q4H  . isosorbide mononitrate  30 mg Oral Daily  . metoprolol tartrate  5 mg Intravenous Q6H  . metoprolol tartrate  50 mg Oral BID  . pantoprazole (PROTONIX) IV  40 mg Intravenous Q24H   Continuous Infusions: . lactated ringers Stopped (12/08/18 2314)  . piperacillin-tazobactam (ZOSYN)  IV 3.375 g (12/09/18 1420)     LOS: 2 days      Calvert Cantor, MD [facility] Pager: [email or web address] Password [facility] 12/09/2018, 2:29 PM

## 2018-12-09 NOTE — Progress Notes (Signed)
Pharmacy Antibiotic Note  [name] Mueller is a 84 y.o. female admitted on 12/07/2018 with aspiration pneumonia.  Pharmacy has been consulted for Zosyn dosing.  Plan: Zosyn 3.375g IV q8h (4 hour infusion).  F/u cxs and clinical progress Monitor V/S and labs  Height: 5\' 9"  (175.3 cm) Weight: 191 lb 5.8 oz (86.8 kg) IBW/kg (Calculated) : 66.2  Temp (24hrs), Avg:98.5 F (36.9 C), Min:98.1 F (36.7 C), Max:99.1 F (37.3 C)  Recent Labs  Lab 12/07/18 0149 12/08/18 0613 12/09/18 0502 12/09/18 0609  WBC 13.8* 18.8* 19.5*  --   CREATININE 1.10* 1.15* 1.62*  --   LATICACIDVEN  --  1.6  --  1.5    Estimated Creatinine Clearance: 31.4 mL/min (A) (by C-G formula based on SCr of 1.62 mg/dL (H)).    Allergies  Allergen Reactions  . Ambien [Zolpidem] Other (See Comments)    confusion  . Ezetimibe Other (See Comments)    unknown  . Lisinopril Cough    Antimicrobials this admission: Zosyn 5/11 >>   Microbiology results: 5/11 BCx: NGTD 5/11 UCx: NGTD  5/11 SARS-2 CV is negative  Thank you for allowing pharmacy to be a part of this patient's care.  Luan Pulling, PharmD, MBA, BCGP Clinical Pharmacist  12/09/2018 8:05 AM

## 2018-12-09 NOTE — Progress Notes (Addendum)
Held AM meds because patient is NPO and scheduled for a procedure. Also the patient is lethargic and awake enough to eat or swallow pills.

## 2018-12-09 NOTE — Progress Notes (Signed)
Subjective:  Lethargic.  Resting comfortably.  Responds to minimal touch but does not verbally communicate.  Objective: Vital signs in last 24 hours: Temp:  [98.1 F (36.7 C)-99.1 F (37.3 C)] 99.1 F (37.3 C) (05/13 0538) Pulse Rate:  [88-111] 111 (05/13 0538) Resp:  [20-22] 20 (05/13 0538) BP: (117-161)/(70-109) 146/109 (05/13 0538) SpO2:  [95 %-98 %] 98 % (05/13 0538) Last BM Date: (UTA) General:   Elderly female in NAD.  No verbal communication today.  Responds to minimal touch. Head:  Normocephalic and atraumatic. Abdomen:  Soft, patient grimaces in the suprapubic region with palpation, some fullness noted there.     Extremities:  Without clubbing, deformity or edema. Neurologic: Drowsy, with lifting bedsheets, she moves her lower extremities. Skin:  Intact without significant lesions or rashes.  Bruising to right hand at IV access Psych: Could not evaluate.  Intake/Output from previous day: 05/12 0701 - 05/13 0700 In: 431.1 [I.V.:376.8; IV Piggyback:54.2] Out: 150 [Urine:150] Intake/Output this shift: No intake/output data recorded.  Lab Results: CBC Recent Labs    12/07/18 0149 12/07/18 1054 12/08/18 0613 12/09/18 0502  WBC 13.8*  --  18.8* 19.5*  HGB 12.8 12.8 12.2 12.9  HCT 38.4 38.4 37.2 38.9  MCV 95.0  --  95.1 96.5  PLT 350  --  343 344   BMET Recent Labs    12/07/18 0149 12/08/18 0613 12/09/18 0502  NA 136 137 138  K 4.0 3.5 4.1  CL 98 101 104  CO2 26 24 17*  GLUCOSE 323* 105* 181*  BUN 16 16 22   CREATININE 1.10* 1.15* 1.62*  CALCIUM 9.0 8.7* 8.3*   LFTs Recent Labs    12/07/18 0149 12/08/18 0613 12/09/18 0502  BILITOT 1.0 1.6* 2.0*  ALKPHOS 80 66 61  AST 13* 24 23  ALT 9 12 14   PROT 6.5 6.1* 6.1*  ALBUMIN 3.4* 3.3* 3.1*   No results for input(s): LIPASE in the last 72 hours. PT/INR No results for input(s): LABPROT, INR in the last 72 hours.    Imaging Studies: Ct Angio Head W Or Wo Contrast  Result Date: 12/07/2018 CLINICAL  DATA:  Altered mental status. Severe headache. Hypertension. EXAM: CT HEAD WITHOUT CONTRAST CT ANGIOGRAPHY OF THE HEAD AND NECK TECHNIQUE: Contiguous axial images were obtained from the base of the skull through the vertex without intravenous contrast. Multidetector CT imaging of the head and neck was performed using the standard protocol during bolus administration of intravenous contrast. Multiplanar CT image reconstructions and MIPs were obtained to evaluate the vascular anatomy. Carotid stenosis measurements (when applicable) are obtained utilizing NASCET criteria, using the distal internal carotid diameter as the denominator. CONTRAST:  OMNIPAQUE IOHEXOL 350 MG/ML SOLN COMPARISON:  Chest CT 05/30/2015 FINDINGS: CT HEAD FINDINGS Brain: There is no mass, hemorrhage or extra-axial collection. There is generalized atrophy without lobar predilection. There is hypoattenuation of the periventricular white matter, most commonly indicating chronic ischemic microangiopathy. Skull: The visualized skull base, calvarium and extracranial soft tissues are normal. Sinuses/Orbits: No fluid levels or advanced mucosal thickening of the visualized paranasal sinuses. No mastoid or middle ear effusion. The orbits are normal. CTA NECK FINDINGS SKELETON: There is moderate-to-severe spinal canal stenosis at C5-6 due to bulky calcifications along the posterior longitudinal ligament. OTHER NECK: Multiple enlarged right mediastinal lymph nodes measuring up to 10 mm. There are multiple subcentimeter hypodense nodules of thyroid gland. UPPER CHEST: No pneumothorax or pleural effusion. No nodules or masses. AORTIC ARCH: There is mild calcific atherosclerosis of  the aortic arch. There is no aneurysm, dissection or hemodynamically significant stenosis of the visualized ascending aorta and aortic arch. Conventional 3 vessel aortic branching pattern. The visualized proximal subclavian arteries are widely patent. RIGHT CAROTID SYSTEM:  --Common carotid artery: Widely patent origin without common carotid artery dissection or aneurysm. --Internal carotid artery: No dissection, occlusion or aneurysm. Mild atherosclerotic calcification at the carotid bifurcation without hemodynamically significant stenosis. --External carotid artery: No acute abnormality. LEFT CAROTID SYSTEM: --Common carotid artery: Widely patent origin without common carotid artery dissection or aneurysm. --Internal carotid artery: No dissection, occlusion or aneurysm. Mild atherosclerotic calcification at the carotid bifurcation without hemodynamically significant stenosis. --External carotid artery: No acute abnormality. VERTEBRAL ARTERIES: Right dominant configuration. Both origins are normal. No dissection, occlusion or flow-limiting stenosis to the skull base. CTA HEAD FINDINGS POSTERIOR CIRCULATION: --Vertebral arteries: Moderate atherosclerotic calcification of both V4 segments. --Posterior inferior cerebellar arteries (PICA): Patent origins from the vertebral arteries. --Anterior inferior cerebellar arteries (AICA): Patent origins from the basilar artery. --Basilar artery: Normal. --Superior cerebellar arteries: Normal. --Posterior cerebral arteries (PCA): Normal. Both originate from the basilar artery. Posterior communicating arteries (p-comm) are diminutive or absent. ANTERIOR CIRCULATION: --Intracranial internal carotid arteries: Atherosclerotic calcification of the internal carotid arteries at the skull base without hemodynamically significant stenosis. --Anterior cerebral arteries (ACA): Normal. Hypoplastic right A1 segment, normal variant. --Middle cerebral arteries (MCA): Normal. VENOUS SINUSES: As permitted by contrast timing, patent. ANATOMIC VARIANTS: None Review of the MIP images confirms the above findings. IMPRESSION: 1. No emergent large vessel occlusion or high-grade stenosis. 2. Upper mediastinal lymphadenopathy, unchanged compared to 05/30/2015. 3.  Moderate-to-severe spinal canal stenosis at C5-6 due to posterior longitudinal ligament calcification. Electronically Signed   By: Deatra RobinsonKevin  Herman M.D.   On: 12/07/2018 03:08   Ct Head Wo Contrast  Result Date: 12/07/2018 CLINICAL DATA:  Altered mental status. Severe headache. Hypertension. EXAM: CT HEAD WITHOUT CONTRAST CT ANGIOGRAPHY OF THE HEAD AND NECK TECHNIQUE: Contiguous axial images were obtained from the base of the skull through the vertex without intravenous contrast. Multidetector CT imaging of the head and neck was performed using the standard protocol during bolus administration of intravenous contrast. Multiplanar CT image reconstructions and MIPs were obtained to evaluate the vascular anatomy. Carotid stenosis measurements (when applicable) are obtained utilizing NASCET criteria, using the distal internal carotid diameter as the denominator. CONTRAST:  100mL OMNIPAQUE IOHEXOL 350 MG/ML SOLN COMPARISON:  Chest CT 05/30/2015 FINDINGS: CT HEAD FINDINGS Brain: There is no mass, hemorrhage or extra-axial collection. There is generalized atrophy without lobar predilection. There is hypoattenuation of the periventricular white matter, most commonly indicating chronic ischemic microangiopathy. Skull: The visualized skull base, calvarium and extracranial soft tissues are normal. Sinuses/Orbits: No fluid levels or advanced mucosal thickening of the visualized paranasal sinuses. No mastoid or middle ear effusion. The orbits are normal. CTA NECK FINDINGS SKELETON: There is moderate-to-severe spinal canal stenosis at C5-6 due to bulky calcifications along the posterior longitudinal ligament. OTHER NECK: Multiple enlarged right mediastinal lymph nodes measuring up to 10 mm. There are multiple subcentimeter hypodense nodules of thyroid gland. UPPER CHEST: No pneumothorax or pleural effusion. No nodules or masses. AORTIC ARCH: There is mild calcific atherosclerosis of the aortic arch. There is no aneurysm,  dissection or hemodynamically significant stenosis of the visualized ascending aorta and aortic arch. Conventional 3 vessel aortic branching pattern. The visualized proximal subclavian arteries are widely patent. RIGHT CAROTID SYSTEM: --Common carotid artery: Widely patent origin without common carotid artery dissection or aneurysm. --Internal carotid artery:  No dissection, occlusion or aneurysm. Mild atherosclerotic calcification at the carotid bifurcation without hemodynamically significant stenosis. --External carotid artery: No acute abnormality. LEFT CAROTID SYSTEM: --Common carotid artery: Widely patent origin without common carotid artery dissection or aneurysm. --Internal carotid artery: No dissection, occlusion or aneurysm. Mild atherosclerotic calcification at the carotid bifurcation without hemodynamically significant stenosis. --External carotid artery: No acute abnormality. VERTEBRAL ARTERIES: Right dominant configuration. Both origins are normal. No dissection, occlusion or flow-limiting stenosis to the skull base. CTA HEAD FINDINGS POSTERIOR CIRCULATION: --Vertebral arteries: Moderate atherosclerotic calcification of both V4 segments. --Posterior inferior cerebellar arteries (PICA): Patent origins from the vertebral arteries. --Anterior inferior cerebellar arteries (AICA): Patent origins from the basilar artery. --Basilar artery: Normal. --Superior cerebellar arteries: Normal. --Posterior cerebral arteries (PCA): Normal. Both originate from the basilar artery. Posterior communicating arteries (p-comm) are diminutive or absent. ANTERIOR CIRCULATION: --Intracranial internal carotid arteries: Atherosclerotic calcification of the internal carotid arteries at the skull base without hemodynamically significant stenosis. --Anterior cerebral arteries (ACA): Normal. Hypoplastic right A1 segment, normal variant. --Middle cerebral arteries (MCA): Normal. VENOUS SINUSES: As permitted by contrast timing, patent.  ANATOMIC VARIANTS: None Review of the MIP images confirms the above findings. IMPRESSION: 1. No emergent large vessel occlusion or high-grade stenosis. 2. Upper mediastinal lymphadenopathy, unchanged compared to 05/30/2015. 3. Moderate-to-severe spinal canal stenosis at C5-6 due to posterior longitudinal ligament calcification. Electronically Signed   By: Deatra Robinson M.D.   On: 12/07/2018 03:08   Ct Angio Neck W And/or Wo Contrast  Result Date: 12/07/2018 CLINICAL DATA:  Altered mental status. Severe headache. Hypertension. EXAM: CT HEAD WITHOUT CONTRAST CT ANGIOGRAPHY OF THE HEAD AND NECK TECHNIQUE: Contiguous axial images were obtained from the base of the skull through the vertex without intravenous contrast. Multidetector CT imaging of the head and neck was performed using the standard protocol during bolus administration of intravenous contrast. Multiplanar CT image reconstructions and MIPs were obtained to evaluate the vascular anatomy. Carotid stenosis measurements (when applicable) are obtained utilizing NASCET criteria, using the distal internal carotid diameter as the denominator. CONTRAST:  OMNIPAQUE IOHEXOL 350 MG/ML SOLN COMPARISON:  Chest CT 05/30/2015 FINDINGS: CT HEAD FINDINGS Brain: There is no mass, hemorrhage or extra-axial collection. There is generalized atrophy without lobar predilection. There is hypoattenuation of the periventricular white matter, most commonly indicating chronic ischemic microangiopathy. Skull: The visualized skull base, calvarium and extracranial soft tissues are normal. Sinuses/Orbits: No fluid levels or advanced mucosal thickening of the visualized paranasal sinuses. No mastoid or middle ear effusion. The orbits are normal. CTA NECK FINDINGS SKELETON: There is moderate-to-severe spinal canal stenosis at C5-6 due to bulky calcifications along the posterior longitudinal ligament. OTHER NECK: Multiple enlarged right mediastinal lymph nodes measuring up to 10 mm.  There are multiple subcentimeter hypodense nodules of thyroid gland. UPPER CHEST: No pneumothorax or pleural effusion. No nodules or masses. AORTIC ARCH: There is mild calcific atherosclerosis of the aortic arch. There is no aneurysm, dissection or hemodynamically significant stenosis of the visualized ascending aorta and aortic arch. Conventional 3 vessel aortic branching pattern. The visualized proximal subclavian arteries are widely patent. RIGHT CAROTID SYSTEM: --Common carotid artery: Widely patent origin without common carotid artery dissection or aneurysm. --Internal carotid artery: No dissection, occlusion or aneurysm. Mild atherosclerotic calcification at the carotid bifurcation without hemodynamically significant stenosis. --External carotid artery: No acute abnormality. LEFT CAROTID SYSTEM: --Common carotid artery: Widely patent origin without common carotid artery dissection or aneurysm. --Internal carotid artery: No dissection, occlusion or aneurysm. Mild atherosclerotic calcification at the  carotid bifurcation without hemodynamically significant stenosis. --External carotid artery: No acute abnormality. VERTEBRAL ARTERIES: Right dominant configuration. Both origins are normal. No dissection, occlusion or flow-limiting stenosis to the skull base. CTA HEAD FINDINGS POSTERIOR CIRCULATION: --Vertebral arteries: Moderate atherosclerotic calcification of both V4 segments. --Posterior inferior cerebellar arteries (PICA): Patent origins from the vertebral arteries. --Anterior inferior cerebellar arteries (AICA): Patent origins from the basilar artery. --Basilar artery: Normal. --Superior cerebellar arteries: Normal. --Posterior cerebral arteries (PCA): Normal. Both originate from the basilar artery. Posterior communicating arteries (p-comm) are diminutive or absent. ANTERIOR CIRCULATION: --Intracranial internal carotid arteries: Atherosclerotic calcification of the internal carotid arteries at the skull base  without hemodynamically significant stenosis. --Anterior cerebral arteries (ACA): Normal. Hypoplastic right A1 segment, normal variant. --Middle cerebral arteries (MCA): Normal. VENOUS SINUSES: As permitted by contrast timing, patent. ANATOMIC VARIANTS: None Review of the MIP images confirms the above findings. IMPRESSION: 1. No emergent large vessel occlusion or high-grade stenosis. 2. Upper mediastinal lymphadenopathy, unchanged compared to 05/30/2015. 3. Moderate-to-severe spinal canal stenosis at C5-6 due to posterior longitudinal ligament calcification. Electronically Signed   By: Deatra Robinson M.D.   On: 12/07/2018 03:08   Dg Chest Port 1 View  Result Date: 12/07/2018 CLINICAL DATA:  Fever. EXAM: PORTABLE CHEST 1 VIEW COMPARISON:  CT chest and PA and lateral chest 05/29/2017. FINDINGS: The lungs are clear. There is cardiomegaly. No pneumothorax or pleural fluid. No acute or focal bony abnormality. IMPRESSION: Cardiomegaly without acute disease. Electronically Signed   By: Drusilla Kanner M.D.   On: 12/07/2018 19:55  [2 weeks]   Assessment:  84 year old female admitted with hypertensive urgency, acute headache, change in mental status.  CT head, CT angios head and neck without acute findings.  Chest x-ray with clear lungs.  Evidence of hematemesis on 2 occasions during mission without overt GI bleeding since May 11 a.m.  Hemoglobin remained stable.    Febrile illness evening of May 11, concerns for aspiration pneumonia, on antibiotic therapy now.  She has had rising white blood cell count, up to 19,500 today.  Lactic acid normal.  Creatinine increased to 1.62.  Blood cultures and urine culture currently no growth.  Lower abdominal discomfort noted on exam today with some fullness.  She has had limited urinary output, 100 cc documented.  Discussed with nursing, Carollee Herter, plan for bladder scan.  If no evidence of significant urinary retention, would consider CT as next step given  leukocytosis.  Plan: 1. Hold off on EGD today. H/H stable. Worsening WBC and poor urine output.  2. Will check bladder scan.  3. Hold off on diet until determine if CT needed as outlined.   Leanna Battles. Dixon Boos [facility] [phone] 5/13/20209:22 AM     LOS: 2 days

## 2018-12-09 NOTE — Care Management Important Message (Signed)
Important Message  Patient Details  Name: [name] Mueller MRN: [id number] Date of Birth: 05-Aug-1934   Medicare Important Message Given:  Yes    Corey Harold 12/09/2018, 4:29 PM

## 2018-12-10 DIAGNOSIS — N179 Acute kidney failure, unspecified: Secondary | ICD-10-CM

## 2018-12-10 DIAGNOSIS — R10819 Abdominal tenderness, unspecified site: Secondary | ICD-10-CM

## 2018-12-10 DIAGNOSIS — R338 Other retention of urine: Secondary | ICD-10-CM

## 2018-12-10 LAB — URINALYSIS, ROUTINE W REFLEX MICROSCOPIC
Bilirubin Urine: NEGATIVE
Glucose, UA: 150 mg/dL — AB
Ketones, ur: 5 mg/dL — AB
Leukocytes,Ua: NEGATIVE
Nitrite: NEGATIVE
Protein, ur: 30 mg/dL — AB
RBC / HPF: >50 RBC/hpf — ABNORMAL HIGH (ref 0–5)
Specific Gravity, Urine: 1.023 (ref 1.005–1.030)
pH: 5 (ref 5.0–8.0)

## 2018-12-10 LAB — GLUCOSE, CAPILLARY
Glucose-Capillary: 118 mg/dL — ABNORMAL HIGH (ref 70–99)
Glucose-Capillary: 121 mg/dL — ABNORMAL HIGH (ref 70–99)
Glucose-Capillary: 138 mg/dL — ABNORMAL HIGH (ref 70–99)
Glucose-Capillary: 166 mg/dL — ABNORMAL HIGH (ref 70–99)
Glucose-Capillary: 185 mg/dL — ABNORMAL HIGH (ref 70–99)
Glucose-Capillary: 233 mg/dL — ABNORMAL HIGH (ref 70–99)

## 2018-12-10 LAB — CBC WITH DIFFERENTIAL/PLATELET
Abs Immature Granulocytes: 0.07 10*3/uL (ref 0.00–0.07)
Basophils Absolute: 0.2 10*3/uL — ABNORMAL HIGH (ref 0.0–0.1)
Basophils Relative: 1 %
Eosinophils Absolute: 0.8 10*3/uL — ABNORMAL HIGH (ref 0.0–0.5)
Eosinophils Relative: 6 %
HCT: 33.6 % — ABNORMAL LOW (ref 36.0–46.0)
Hemoglobin: 10.8 g/dL — ABNORMAL LOW (ref 12.0–15.0)
Immature Granulocytes: 1 %
Lymphocytes Relative: 9 %
Lymphs Abs: 1.4 10*3/uL (ref 0.7–4.0)
MCH: 31.4 pg (ref 26.0–34.0)
MCHC: 32.1 g/dL (ref 30.0–36.0)
MCV: 97.7 fL (ref 80.0–100.0)
Monocytes Absolute: 1.2 10*3/uL — ABNORMAL HIGH (ref 0.1–1.0)
Monocytes Relative: 8 %
Neutro Abs: 11.7 10*3/uL — ABNORMAL HIGH (ref 1.7–7.7)
Neutrophils Relative %: 75 %
Platelets: 313 10*3/uL (ref 150–400)
RBC: 3.44 MIL/uL — ABNORMAL LOW (ref 3.87–5.11)
RDW: 13.8 % (ref 11.5–15.5)
WBC: 15.3 10*3/uL — ABNORMAL HIGH (ref 4.0–10.5)
nRBC: 0 % (ref 0.0–0.2)

## 2018-12-10 LAB — BASIC METABOLIC PANEL
Anion gap: 14 (ref 5–15)
BUN: 27 mg/dL — ABNORMAL HIGH (ref 8–23)
CO2: 19 mmol/L — ABNORMAL LOW (ref 22–32)
Calcium: 7.9 mg/dL — ABNORMAL LOW (ref 8.9–10.3)
Chloride: 108 mmol/L (ref 98–111)
Creatinine, Ser: 1.33 mg/dL — ABNORMAL HIGH (ref 0.44–1.00)
GFR calc Af Amer: 43 mL/min — ABNORMAL LOW (ref >60)
GFR calc non Af Amer: 37 mL/min — ABNORMAL LOW (ref >60)
Glucose, Bld: 177 mg/dL — ABNORMAL HIGH (ref 70–99)
Potassium: 3.2 mmol/L — ABNORMAL LOW (ref 3.5–5.1)
Sodium: 141 mmol/L (ref 135–145)

## 2018-12-10 LAB — URINE CULTURE: Culture: NO GROWTH

## 2018-12-10 MED ORDER — POTASSIUM CHLORIDE CRYS ER 20 MEQ PO TBCR
40.0000 meq | EXTENDED_RELEASE_TABLET | Freq: Once | ORAL | Status: AC
  Administered 2018-12-10: 40 meq via ORAL
  Filled 2018-12-10: qty 2

## 2018-12-10 MED ORDER — POTASSIUM CHLORIDE CRYS ER 20 MEQ PO TBCR: 40.0000 meq | EXTENDED_RELEASE_TABLET | ORAL | Status: DC

## 2018-12-10 MED ORDER — HYDRALAZINE HCL 25 MG PO TABS
25.0000 mg | ORAL_TABLET | Freq: Three times a day (TID) | ORAL | Status: DC
  Administered 2018-12-10 – 2018-12-12 (×6): 25 mg via ORAL
  Filled 2018-12-10 (×7): qty 1

## 2018-12-10 MED ORDER — CHLORHEXIDINE GLUCONATE CLOTH 2 % EX PADS
6.0000 | MEDICATED_PAD | Freq: Every day | CUTANEOUS | Status: DC
  Administered 2018-12-12: 6 via TOPICAL

## 2018-12-10 MED ORDER — POTASSIUM CHLORIDE IN NACL 40-0.9 MEQ/L-% IV SOLN
INTRAVENOUS | Status: DC
  Administered 2018-12-10 – 2018-12-11 (×3): 125 mL/h via INTRAVENOUS

## 2018-12-10 MED ORDER — MUPIROCIN 2 % EX OINT
1.0000 "application " | TOPICAL_OINTMENT | Freq: Two times a day (BID) | CUTANEOUS | Status: DC
  Administered 2018-12-10 – 2018-12-12 (×4): 1 via NASAL
  Filled 2018-12-10: qty 22

## 2018-12-10 NOTE — Plan of Care (Signed)
  Problem: Acute Rehab PT Goals(only PT should resolve) Goal: Pt Will Go Supine/Side To Sit Outcome: Progressing Flowsheets (Taken 12/10/2018 1323) Pt will go Supine/Side to Sit: with minimal assist Goal: Patient Will Transfer Sit To/From Stand Outcome: Progressing Flowsheets (Taken 12/10/2018 1323) Patient will transfer sit to/from stand: with minimal assist Goal: Pt Will Transfer Bed To Chair/Chair To Bed Outcome: Progressing Flowsheets (Taken 12/10/2018 1323) Pt will Transfer Bed to Chair/Chair to Bed: with min assist Goal: Pt Will Ambulate Outcome: Progressing Flowsheets (Taken 12/10/2018 1323) Pt will Ambulate: 25 feet; with minimal assist; with rolling walker   1:23 PM, 12/10/18 Ocie Bob, MPT Physical Therapist with [facility] 336 [phone] office [phone] mobile phone

## 2018-12-10 NOTE — Evaluation (Signed)
Physical Therapy Evaluation Patient Details Name: [name] Mueller MRN: [id number] DOB: 1934-12-28 Today's Date: 12/10/2018   History of Present Illness  [name] Mueller  is a 84 y.o. female, w hypertension, hyperlipidemia, dm2, CAD, pulmonary hypertension, anemia, adrenal insufficiency, apparently presents with headache (severe).  Pt was brought to ER and found to have bp markedly elevated. Pt apparently denies fever, chills, cough, cp, palp, sob, n/v, abd pain, diarrhea, brbpr, dysuria.     Clinical Impression  Patient functioning well below baseline and limited for functional mobility and gait as stated below secondary to BLE weakness, fatigue and poor standing balance.  Patient demonstrates slow labored movement for sitting up at bedside with most difficulty scooting, very unsteady on feet, limited to a few side steps to transfer to chair and unsafe to attempt stepping away from bedside due to fall risk.  Patient tolerated sitting up in chair after therapy.  Patient will benefit from continued physical therapy in hospital and recommended venue below to increase strength, balance, endurance for safe ADLs and gait.    Follow Up Recommendations SNF    Equipment Recommendations  None recommended by PT    Recommendations for Other Services       Precautions / Restrictions Precautions Precautions: Fall Restrictions Weight Bearing Restrictions: No      Mobility  Bed Mobility Overal bed mobility: Needs Assistance Bed Mobility: Supine to Sit     Supine to sit: Mod assist     General bed mobility comments: slow labored movement and had difficulty scooting to bedside  Transfers Overall transfer level: Needs assistance Equipment used: Rolling walker (2 wheeled) Transfers: Sit to/from [employer]GI CorporationStand;Stand Pivot Transfers Sit to Stand: Mod assist Stand pivot transfers: Mod assist       General transfer comment: increased time, labored movement  Ambulation/Gait Ambulation/Gait assistance:  Mod assist;Max assist Gait Distance (Feet): 5 Feet Assistive device: Rolling walker (2 wheeled) Gait Pattern/deviations: Decreased step length - right;Decreased step length - left;Decreased stride length Gait velocity: slow   General Gait Details: limited to 6-7 slow labored short side steps at bedside due to weakness, fatigue, fair/poor standing balance  Stairs            Wheelchair Mobility    Modified Rankin (Stroke Patients Only)       Balance Overall balance assessment: Needs assistance Sitting-balance support: Feet supported;No upper extremity supported Sitting balance-Leahy Scale: Fair     Standing balance support: Bilateral upper extremity supported;During functional activity Standing balance-Leahy Scale: Poor Standing balance comment: fair/poor using RW                             Pertinent Vitals/Pain Pain Assessment: Faces Faces Pain Scale: Hurts a little bit Pain Location: discomfort with pressure to BLE/feet Pain Descriptors / Indicators: Sore;Grimacing;Guarding Pain Intervention(s): Limited activity within patient's tolerance;Monitored during session    Home Living Family/patient expects to be discharged to:: Private residence Living Arrangements: Spouse/significant other;Other relatives Available Help at Discharge: Family Type of Home: House Home Access: Level entry     Home Layout: One level Home Equipment: Cane - single point;Walker - standard;Wheelchair - manual Additional Comments: Patient may be a poor historian due to slight confusion    Prior Function Level of Independence: Independent         Comments: Community ambulator without AD, drives, "per patient"     Hand Dominance        Extremity/Trunk Assessment   Upper Extremity Assessment  Upper Extremity Assessment: Generalized weakness    Lower Extremity Assessment Lower Extremity Assessment: Generalized weakness    Cervical / Trunk Assessment Cervical /  Trunk Assessment: Normal  Communication   Communication: No difficulties  Cognition Arousal/Alertness: Awake/alert Behavior During Therapy: WFL for tasks assessed/performed;Anxious Overall Cognitive Status: Within Functional Limits for tasks assessed                                 General Comments: required occasional encouragement to participate      General Comments      Exercises     Assessment/Plan    PT Assessment Patient needs continued PT services  PT Problem List Decreased strength;Decreased activity tolerance;Decreased balance;Decreased mobility       PT Treatment Interventions Therapeutic exercise;Gait training;Stair training;Functional mobility training;Therapeutic activities;Patient/family education    PT Goals (Current goals can be found in the Care Plan section)  Acute Rehab PT Goals Patient Stated Goal: return home with family to assist PT Goal Formulation: With patient Time For Goal Achievement: 12/24/18 Potential to Achieve Goals: Good    Frequency Min 3X/week   Barriers to discharge        Co-evaluation               AM-PAC PT "6 Clicks" Mobility  Outcome Measure Help needed turning from your back to your side while in a flat bed without using bedrails?: A Lot Help needed moving from lying on your back to sitting on the side of a flat bed without using bedrails?: A Lot Help needed moving to and from a bed to a chair (including a wheelchair)?: A Lot Help needed standing up from a chair using your arms (e.g., wheelchair or bedside chair)?: A Lot Help needed to walk in hospital room?: A Lot Help needed climbing 3-5 steps with a railing? : Total 6 Click Score: 11    End of Session   Activity Tolerance: Patient tolerated treatment well;Patient limited by fatigue Patient left: in chair;with call bell/phone within reach;with chair alarm set Nurse Communication: Mobility status PT Visit Diagnosis: Unsteadiness on feet  (R26.81);Other abnormalities of gait and mobility (R26.89);Muscle weakness (generalized) (M62.81)    Time: 1230-1305 PT Time Calculation (min) (ACUTE ONLY): 35 min   Charges:   PT Evaluation $PT Eval Moderate Complexity: 1 Mod PT Treatments $Therapeutic Activity: 23-37 mins        1:21 PM, 12/10/18 Ocie Bob, MPT Physical Therapist with [facility] 336 [phone] office [phone] mobile phone

## 2018-12-10 NOTE — Progress Notes (Addendum)
Subjective:  Alert this morning. Thinks she is at home. Oriented to person, time. Denies abdominal pain. Ate sips of clear liquid diet.   Objective: Vital signs in last 24 hours: Temp:  [98.2 F (36.8 C)-98.6 F (37 C)] 98.5 F (36.9 C) (05/14 0525) Pulse Rate:  [85-103] 91 (05/14 0525) Resp:  [16-18] 16 (05/14 0525) BP: (124-155)/(46-73) 147/68 (05/14 0525) SpO2:  [95 %-98 %] 95 % (05/14 0525) Last BM Date: (UTA) General:   Alert,  Well-developed, well-nourished, pleasant and cooperative in NAD Head:  Normocephalic and atraumatic. Eyes:  Sclera clear, no icterus.  Abdomen:  Soft, nontender and nondistended.  Extremities:  Without clubbing, deformity or edema. Neurologic:  Alert and  oriented x 2;   Skin:  Intact without significant lesions or rashes. Bruising at iv sites, right hand Psych:  Alert and cooperative. Normal mood and affect.  Intake/Output from previous day: 05/13 0701 - 05/14 0700 In: 0  Out: 1325 [Urine:1325] Intake/Output this shift: No intake/output data recorded.  Lab Results: CBC Recent Labs    12/08/18 0613 12/09/18 0502 12/10/18 0859  WBC 18.8* 19.5* 15.3*  HGB 12.2 12.9 10.8*  HCT 37.2 38.9 33.6*  MCV 95.1 96.5 97.7  PLT 343 344 313   BMET Recent Labs    12/08/18 0613 12/09/18 0502 12/10/18 0859  NA 137 138 141  K 3.5 4.1 3.2*  CL 101 104 108  CO2 24 17* 19*  GLUCOSE 105* 181* 177*  BUN 16 22 27*  CREATININE 1.15* 1.62* 1.33*  CALCIUM 8.7* 8.3* 7.9*   LFTs Recent Labs    12/08/18 0613 12/09/18 0502  BILITOT 1.6* 2.0*  ALKPHOS 66 61  AST 24 23  ALT 12 14  PROT 6.1* 6.1*  ALBUMIN 3.3* 3.1*   No results for input(s): LIPASE in the last 72 hours. PT/INR No results for input(s): LABPROT, INR in the last 72 hours.    Imaging Studies: Ct Angio Head W Or Wo Contrast  Result Date: 12/07/2018 CLINICAL DATA:  Altered mental status. Severe headache. Hypertension. EXAM: CT HEAD WITHOUT CONTRAST CT ANGIOGRAPHY OF THE HEAD AND NECK  TECHNIQUE: Contiguous axial images were obtained from the base of the skull through the vertex without intravenous contrast. Multidetector CT imaging of the head and neck was performed using the standard protocol during bolus administration of intravenous contrast. Multiplanar CT image reconstructions and MIPs were obtained to evaluate the vascular anatomy. Carotid stenosis measurements (when applicable) are obtained utilizing NASCET criteria, using the distal internal carotid diameter as the denominator. CONTRAST:  100mL OMNIPAQUE IOHEXOL 350 MG/ML SOLN COMPARISON:  Chest CT 05/30/2015 FINDINGS: CT HEAD FINDINGS Brain: There is no mass, hemorrhage or extra-axial collection. There is generalized atrophy without lobar predilection. There is hypoattenuation of the periventricular white matter, most commonly indicating chronic ischemic microangiopathy. Skull: The visualized skull base, calvarium and extracranial soft tissues are normal. Sinuses/Orbits: No fluid levels or advanced mucosal thickening of the visualized paranasal sinuses. No mastoid or middle ear effusion. The orbits are normal. CTA NECK FINDINGS SKELETON: There is moderate-to-severe spinal canal stenosis at C5-6 due to bulky calcifications along the posterior longitudinal ligament. OTHER NECK: Multiple enlarged right mediastinal lymph nodes measuring up to 10 mm. There are multiple subcentimeter hypodense nodules of thyroid gland. UPPER CHEST: No pneumothorax or pleural effusion. No nodules or masses. AORTIC ARCH: There is mild calcific atherosclerosis of the aortic arch. There is no aneurysm, dissection or hemodynamically significant stenosis of the visualized ascending aorta and aortic arch. Conventional 3  vessel aortic branching pattern. The visualized proximal subclavian arteries are widely patent. RIGHT CAROTID SYSTEM: --Common carotid artery: Widely patent origin without common carotid artery dissection or aneurysm. --Internal carotid artery: No  dissection, occlusion or aneurysm. Mild atherosclerotic calcification at the carotid bifurcation without hemodynamically significant stenosis. --External carotid artery: No acute abnormality. LEFT CAROTID SYSTEM: --Common carotid artery: Widely patent origin without common carotid artery dissection or aneurysm. --Internal carotid artery: No dissection, occlusion or aneurysm. Mild atherosclerotic calcification at the carotid bifurcation without hemodynamically significant stenosis. --External carotid artery: No acute abnormality. VERTEBRAL ARTERIES: Right dominant configuration. Both origins are normal. No dissection, occlusion or flow-limiting stenosis to the skull base. CTA HEAD FINDINGS POSTERIOR CIRCULATION: --Vertebral arteries: Moderate atherosclerotic calcification of both V4 segments. --Posterior inferior cerebellar arteries (PICA): Patent origins from the vertebral arteries. --Anterior inferior cerebellar arteries (AICA): Patent origins from the basilar artery. --Basilar artery: Normal. --Superior cerebellar arteries: Normal. --Posterior cerebral arteries (PCA): Normal. Both originate from the basilar artery. Posterior communicating arteries (p-comm) are diminutive or absent. ANTERIOR CIRCULATION: --Intracranial internal carotid arteries: Atherosclerotic calcification of the internal carotid arteries at the skull base without hemodynamically significant stenosis. --Anterior cerebral arteries (ACA): Normal. Hypoplastic right A1 segment, normal variant. --Middle cerebral arteries (MCA): Normal. VENOUS SINUSES: As permitted by contrast timing, patent. ANATOMIC VARIANTS: None Review of the MIP images confirms the above findings. IMPRESSION: 1. No emergent large vessel occlusion or high-grade stenosis. 2. Upper mediastinal lymphadenopathy, unchanged compared to 05/30/2015. 3. Moderate-to-severe spinal canal stenosis at C5-6 due to posterior longitudinal ligament calcification. Electronically Signed   By: Deatra Robinson M.D.   On: 12/07/2018 03:08   Ct Head Wo Contrast  Result Date: 12/07/2018 CLINICAL DATA:  Altered mental status. Severe headache. Hypertension. EXAM: CT HEAD WITHOUT CONTRAST CT ANGIOGRAPHY OF THE HEAD AND NECK TECHNIQUE: Contiguous axial images were obtained from the base of the skull through the vertex without intravenous contrast. Multidetector CT imaging of the head and neck was performed using the standard protocol during bolus administration of intravenous contrast. Multiplanar CT image reconstructions and MIPs were obtained to evaluate the vascular anatomy. Carotid stenosis measurements (when applicable) are obtained utilizing NASCET criteria, using the distal internal carotid diameter as the denominator. CONTRAST:  OMNIPAQUE IOHEXOL 350 MG/ML SOLN COMPARISON:  Chest CT 05/30/2015 FINDINGS: CT HEAD FINDINGS Brain: There is no mass, hemorrhage or extra-axial collection. There is generalized atrophy without lobar predilection. There is hypoattenuation of the periventricular white matter, most commonly indicating chronic ischemic microangiopathy. Skull: The visualized skull base, calvarium and extracranial soft tissues are normal. Sinuses/Orbits: No fluid levels or advanced mucosal thickening of the visualized paranasal sinuses. No mastoid or middle ear effusion. The orbits are normal. CTA NECK FINDINGS SKELETON: There is moderate-to-severe spinal canal stenosis at C5-6 due to bulky calcifications along the posterior longitudinal ligament. OTHER NECK: Multiple enlarged right mediastinal lymph nodes measuring up to 10 mm. There are multiple subcentimeter hypodense nodules of thyroid gland. UPPER CHEST: No pneumothorax or pleural effusion. No nodules or masses. AORTIC ARCH: There is mild calcific atherosclerosis of the aortic arch. There is no aneurysm, dissection or hemodynamically significant stenosis of the visualized ascending aorta and aortic arch. Conventional 3 vessel aortic branching  pattern. The visualized proximal subclavian arteries are widely patent. RIGHT CAROTID SYSTEM: --Common carotid artery: Widely patent origin without common carotid artery dissection or aneurysm. --Internal carotid artery: No dissection, occlusion or aneurysm. Mild atherosclerotic calcification at the carotid bifurcation without hemodynamically significant stenosis. --External carotid artery: No acute abnormality.  LEFT CAROTID SYSTEM: --Common carotid artery: Widely patent origin without common carotid artery dissection or aneurysm. --Internal carotid artery: No dissection, occlusion or aneurysm. Mild atherosclerotic calcification at the carotid bifurcation without hemodynamically significant stenosis. --External carotid artery: No acute abnormality. VERTEBRAL ARTERIES: Right dominant configuration. Both origins are normal. No dissection, occlusion or flow-limiting stenosis to the skull base. CTA HEAD FINDINGS POSTERIOR CIRCULATION: --Vertebral arteries: Moderate atherosclerotic calcification of both V4 segments. --Posterior inferior cerebellar arteries (PICA): Patent origins from the vertebral arteries. --Anterior inferior cerebellar arteries (AICA): Patent origins from the basilar artery. --Basilar artery: Normal. --Superior cerebellar arteries: Normal. --Posterior cerebral arteries (PCA): Normal. Both originate from the basilar artery. Posterior communicating arteries (p-comm) are diminutive or absent. ANTERIOR CIRCULATION: --Intracranial internal carotid arteries: Atherosclerotic calcification of the internal carotid arteries at the skull base without hemodynamically significant stenosis. --Anterior cerebral arteries (ACA): Normal. Hypoplastic right A1 segment, normal variant. --Middle cerebral arteries (MCA): Normal. VENOUS SINUSES: As permitted by contrast timing, patent. ANATOMIC VARIANTS: None Review of the MIP images confirms the above findings. IMPRESSION: 1. No emergent large vessel occlusion or high-grade  stenosis. 2. Upper mediastinal lymphadenopathy, unchanged compared to 05/30/2015. 3. Moderate-to-severe spinal canal stenosis at C5-6 due to posterior longitudinal ligament calcification. Electronically Signed   By: Deatra Robinson M.D.   On: 12/07/2018 03:08   Ct Angio Neck W And/or Wo Contrast  Result Date: 12/07/2018 CLINICAL DATA:  Altered mental status. Severe headache. Hypertension. EXAM: CT HEAD WITHOUT CONTRAST CT ANGIOGRAPHY OF THE HEAD AND NECK TECHNIQUE: Contiguous axial images were obtained from the base of the skull through the vertex without intravenous contrast. Multidetector CT imaging of the head and neck was performed using the standard protocol during bolus administration of intravenous contrast. Multiplanar CT image reconstructions and MIPs were obtained to evaluate the vascular anatomy. Carotid stenosis measurements (when applicable) are obtained utilizing NASCET criteria, using the distal internal carotid diameter as the denominator. CONTRAST:  OMNIPAQUE IOHEXOL 350 MG/ML SOLN COMPARISON:  Chest CT 05/30/2015 FINDINGS: CT HEAD FINDINGS Brain: There is no mass, hemorrhage or extra-axial collection. There is generalized atrophy without lobar predilection. There is hypoattenuation of the periventricular white matter, most commonly indicating chronic ischemic microangiopathy. Skull: The visualized skull base, calvarium and extracranial soft tissues are normal. Sinuses/Orbits: No fluid levels or advanced mucosal thickening of the visualized paranasal sinuses. No mastoid or middle ear effusion. The orbits are normal. CTA NECK FINDINGS SKELETON: There is moderate-to-severe spinal canal stenosis at C5-6 due to bulky calcifications along the posterior longitudinal ligament. OTHER NECK: Multiple enlarged right mediastinal lymph nodes measuring up to 10 mm. There are multiple subcentimeter hypodense nodules of thyroid gland. UPPER CHEST: No pneumothorax or pleural effusion. No nodules or masses.  AORTIC ARCH: There is mild calcific atherosclerosis of the aortic arch. There is no aneurysm, dissection or hemodynamically significant stenosis of the visualized ascending aorta and aortic arch. Conventional 3 vessel aortic branching pattern. The visualized proximal subclavian arteries are widely patent. RIGHT CAROTID SYSTEM: --Common carotid artery: Widely patent origin without common carotid artery dissection or aneurysm. --Internal carotid artery: No dissection, occlusion or aneurysm. Mild atherosclerotic calcification at the carotid bifurcation without hemodynamically significant stenosis. --External carotid artery: No acute abnormality. LEFT CAROTID SYSTEM: --Common carotid artery: Widely patent origin without common carotid artery dissection or aneurysm. --Internal carotid artery: No dissection, occlusion or aneurysm. Mild atherosclerotic calcification at the carotid bifurcation without hemodynamically significant stenosis. --External carotid artery: No acute abnormality. VERTEBRAL ARTERIES: Right dominant configuration. Both origins are normal. No  dissection, occlusion or flow-limiting stenosis to the skull base. CTA HEAD FINDINGS POSTERIOR CIRCULATION: --Vertebral arteries: Moderate atherosclerotic calcification of both V4 segments. --Posterior inferior cerebellar arteries (PICA): Patent origins from the vertebral arteries. --Anterior inferior cerebellar arteries (AICA): Patent origins from the basilar artery. --Basilar artery: Normal. --Superior cerebellar arteries: Normal. --Posterior cerebral arteries (PCA): Normal. Both originate from the basilar artery. Posterior communicating arteries (p-comm) are diminutive or absent. ANTERIOR CIRCULATION: --Intracranial internal carotid arteries: Atherosclerotic calcification of the internal carotid arteries at the skull base without hemodynamically significant stenosis. --Anterior cerebral arteries (ACA): Normal. Hypoplastic right A1 segment, normal variant.  --Middle cerebral arteries (MCA): Normal. VENOUS SINUSES: As permitted by contrast timing, patent. ANATOMIC VARIANTS: None Review of the MIP images confirms the above findings. IMPRESSION: 1. No emergent large vessel occlusion or high-grade stenosis. 2. Upper mediastinal lymphadenopathy, unchanged compared to 05/30/2015. 3. Moderate-to-severe spinal canal stenosis at C5-6 due to posterior longitudinal ligament calcification. Electronically Signed   By: Deatra Robinson M.D.   On: 12/07/2018 03:08   Dg Chest Port 1 View  Result Date: 12/07/2018 CLINICAL DATA:  Fever. EXAM: PORTABLE CHEST 1 VIEW COMPARISON:  CT chest and PA and lateral chest 05/29/2017. FINDINGS: The lungs are clear. There is cardiomegaly. No pneumothorax or pleural fluid. No acute or focal bony abnormality. IMPRESSION: Cardiomegaly without acute disease. Electronically Signed   By: Drusilla Kanner M.D.   On: 12/07/2018 19:55  [2 weeks]   Assessment:  84 year old female admitted with hypertensive urgency, acute headache, change in mental status from her baseline.CT head, CT angios head and neck without acute findings.  Chest x-ray with clear lungs.  Evidence of hematemesis on 2 occasions during mission without overt GI bleeding since May 11 a.m.  EGD placed on hold with unexplained leukocytosis and mental status changes.  Febrile illness evening of May 11, concerns for aspiration pneumonia, on antibiotic therapy.  Cell count rose to 2500.  Creatinine increased to 1.62.  She was noted to have suprapubic distention therefore bladder scan was ordered.  By scan she had 862 cc, after in and out catheter she had 1200 cc of dark brown urine with sediment.  Overnight she had ongoing significant urinary retention so Foley catheter has been placed.  Urine culture from yesterday specimen is pending.  Previous urine and blood cultures negative from 11th.  Today her mental status is much better. Creatinine is improved. Her WBC improved as well. Her H/H  have dropped to 10.8/33.6. No overt GI bleeding or recurrent hematemesis and could be in part due to hemodilution.   Plan: 1. She may not require EGD this admission given no further hematemesis. She has had some decline in Hgb to 10.8 but again could in part be due to hemodilution.  2. Continue PPI.  3. F/u urine culture results.  4. Advance diet.   Leanna Battles. [place] [facility] [phone] 5/14/202010:02 AM      LOS: 3 days

## 2018-12-10 NOTE — Progress Notes (Signed)
Bladder scan done, ranging from 212-285. Abdomen soft, no discomfort noted. Will re-scan in approx. 2 hrs.

## 2018-12-10 NOTE — Progress Notes (Signed)
Bladder scan showing at this time. In/out cath was done at 1000 and has not voided since. Notified Dr. Robb Matar, new order for foley cath.

## 2018-12-10 NOTE — Progress Notes (Addendum)
PROGRESS NOTE    [name] Mueller   IPJ:[id number]  DOB: 07/11/1935  DOA: 12/07/2018 PCP: Elfredia Nevins, MD   Brief Narrative:  [name] Mueller is a84 y.o.female,w hypertension, hyperlipidemia, dm2, CAD, pulmonary hypertension, anemia, adrenal insufficiency, apparently presents with headache (severe). Pt was brought to ER and found to have bp markedly elevated.   Patient was admitted with severe headache likely related to hypertensive urgency.  She was noted to have some severe anxiety along with epigastric abdominal pain and profound hematemesis with blood clots noted on the morning of 5/11.  Blood pressures continue to remain somewhat elevated, but she has been started on a clear liquid diet by GI on 5/12 and will resume some of her home blood pressure medications today while maintaining on IV fluid.  Zosyn was started as well on 5/11 due to concerns for aspiration pneumonia and she is noted to have leukocytosis and ongoing low-grade temperatures.    Subjective: She has no complaints.     Assessment & Plan:   Principal Problem:   Hematemesis - has resolved - Gi following - Hb is stable- no further vomiting  - cont LR at 100 cc/hr, PPI  Active Problems:   Acute encephalopathy - cont treatment of underlying infection- she remains confused    Hypertensive urgency,   Headache - has resolved- add oral Hydralzine today - cont Amlodipine and Lopressor- holding Diovan due to AKI  Urinary retention - after I and O cath, very dark brown urine obtained- have asked for UA which is still pending- U culture sent - follow leukocytosis- cont Zosyn - urine culture from 5/11 is negative - now has a foley   AKI - from urinary retention and dehydration- cont IVF - holding Diovan  Hypokalemia - replacing orally and via IV  Suspected aspiration pneumonia - cont Zosyn     Diabetes mellitus without complication  - change SSI to with meals       Time spent in minutes: 35 DVT  prophylaxis: SCD Code Status: Full code Family Communication:  Disposition Plan: home when stable Consultants:   GI Procedures:     Antimicrobials:  Anti-infectives (From admission, onward)   Start     Dose/Rate Route Frequency Ordered Stop   12/07/18 2000  piperacillin-tazobactam (ZOSYN) IVPB 3.375 g     3.375 g 12.5 mL/hr over 240 Minutes Intravenous Every 8 hours 12/07/18 1939         Objective: Vitals:   12/09/18 2123 12/10/18 0030 12/10/18 0340 12/10/18 0525  BP: 124/64 (!) 150/64 138/62 (!) 147/68  Pulse: 94 85 86 91  Resp: 16   16  Temp: 98.6 F (37 C)   98.5 F (36.9 C)  TempSrc: Oral   Oral  SpO2: 98%   95%  Weight:      Height:        Intake/Output Summary (Last 24 hours) at 12/10/2018 1141 Last data filed at 12/10/2018 0800 Gross per 24 hour  Intake 240 ml  Output 125 ml  Net 115 ml   Filed Weights   12/07/18 0125 12/07/18 0848  Weight: 97.1 kg 86.8 kg    Examination: General exam: Appears comfortable  HEENT: PERRLA, oral mucosa moist, no sclera icterus or thrush Respiratory system: Clear to auscultation. Respiratory effort normal. Cardiovascular system: S1 & S2 heard,  No murmurs  Gastrointestinal system: Abdomen soft, non-tender, nondistended. Normal bowel sounds   Central nervous system: Alert and oriented only to person. No focal neurological deficits. Extremities: No cyanosis,  clubbing or edema Skin: No rashes or ulcers Psychiatry:  Mood & affect appropriate.   Data Reviewed: I have personally reviewed following labs and imaging studies  CBC: Recent Labs  Lab 12/07/18 0149 12/07/18 1054 12/08/18 0613 12/09/18 0502 12/10/18 0859  WBC 13.8*  --  18.8* 19.5* 15.3*  NEUTROABS 10.9*  --   --   --  11.7*  HGB 12.8 12.8 12.2 12.9 10.8*  HCT 38.4 38.4 37.2 38.9 33.6*  MCV 95.0  --  95.1 96.5 97.7  PLT 350  --  343 344 313   Basic Metabolic Panel: Recent Labs  Lab 12/07/18 0149 12/08/18 0613 12/09/18 0502 12/10/18 0859  NA 136 137  138 141  K 4.0 3.5 4.1 3.2*  CL 98 101 104 108  CO2 26 24 17* 19*  GLUCOSE 323* 105* 181* 177*  BUN 16 16 22  27*  CREATININE 1.10* 1.15* 1.62* 1.33*  CALCIUM 9.0 8.7* 8.3* 7.9*   GFR: Estimated Creatinine Clearance: 38.3 mL/min (A) (by C-G formula based on SCr of 1.33 mg/dL (H)). Liver Function Tests: Recent Labs  Lab 12/07/18 0149 12/08/18 0613 12/09/18 0502  AST 13* 24 23  ALT 9 12 14   ALKPHOS 80 66 61  BILITOT 1.0 1.6* 2.0*  PROT 6.5 6.1* 6.1*  ALBUMIN 3.4* 3.3* 3.1*   No results for input(s): LIPASE, AMYLASE in the last 168 hours. No results for input(s): AMMONIA in the last 168 hours. Coagulation Profile: No results for input(s): INR, PROTIME in the last 168 hours. Cardiac Enzymes: Recent Labs  Lab 12/07/18 0608  TROPONINI <0.03   BNP (last 3 results) No results for input(s): PROBNP in the last 8760 hours. HbA1C: No results for input(s): HGBA1C in the last 72 hours. CBG: Recent Labs  Lab 12/09/18 1951 12/10/18 0002 12/10/18 0408 12/10/18 0730 12/10/18 1110  GLUCAP 138* 118* 138* 121* 185*   Lipid Profile: No results for input(s): CHOL, HDL, LDLCALC, TRIG, CHOLHDL, LDLDIRECT in the last 72 hours. Thyroid Function Tests: No results for input(s): TSH, T4TOTAL, FREET4, T3FREE, THYROIDAB in the last 72 hours. Anemia Panel: No results for input(s): VITAMINB12, FOLATE, FERRITIN, TIBC, IRON, RETICCTPCT in the last 72 hours. Urine analysis:    Component Value Date/Time   COLORURINE STRAW (A) 12/07/2018 0438   APPEARANCEUR CLEAR 12/07/2018 0438   LABSPEC 1.024 12/07/2018 0438   PHURINE 7.0 12/07/2018 0438   GLUCOSEU >=500 (A) 12/07/2018 0438   HGBUR NEGATIVE 12/07/2018 0438   BILIRUBINUR NEGATIVE 12/07/2018 0438   KETONESUR NEGATIVE 12/07/2018 0438   PROTEINUR NEGATIVE 12/07/2018 0438   UROBILINOGEN 0.2 05/31/2015 0045   NITRITE NEGATIVE 12/07/2018 0438   LEUKOCYTESUR NEGATIVE 12/07/2018 0438   Sepsis Labs: @LABRCNTIP (procalcitonin:4,lacticidven:4) )  Recent Results (from the past 240 hour(s))  Urine Culture     Status: None   Collection Time: 12/07/18  4:38 AM  Result Value Ref Range Status   Specimen Description   Final    URINE, CLEAN CATCH Performed at [facility], [address] [place], [place] [address]    Special Requests   Final    NONE Performed at [facility], [address] [place], [place] [address]    Culture   Final    NO GROWTH Performed at [facility], [address] [place], [place] [address]    Report Status 12/08/2018 FINAL  Final  SARS Coronavirus 2 [facility](Hospital order, Performed in [facility] lab)     Status: None   Collection Time: 12/07/18  4:45 AM  Result Value Ref Range Status   SARS Coronavirus 2 NEGATIVE NEGATIVE Final    Comment: (NOTE) If result is NEGATIVE SARS-CoV-2 target nucleic acids are NOT DETECTED. The SARS-CoV-2 RNA is generally detectable in upper and lower  respiratory specimens during the acute phase of infection. The lowest  concentration of SARS-CoV-2 viral copies this assay can detect is 250  copies / mL. A negative result does not preclude SARS-CoV-2 infection  and should not be used as the sole basis for treatment or other  patient management decisions.  A negative result may occur with  improper specimen collection / handling, submission of specimen other  than nasopharyngeal swab, presence of viral mutation(s) within the  areas targeted by this assay, and inadequate number of viral copies  (<250 copies / mL). A negative result must be combined with clinical  observations, patient history, and epidemiological information. If result is POSITIVE SARS-CoV-2 target nucleic acids are DETECTED. The SARS-CoV-2 RNA is generally detectable in upper and lower  respiratory specimens dur ing the acute phase of infection.  Positive  results are indicative of active infection with SARS-CoV-2.  Clinical  correlation with patient history and other diagnostic information is   necessary to determine patient infection status.  Positive results do  not rule out bacterial infection or co-infection with other viruses. If result is PRESUMPTIVE POSTIVE SARS-CoV-2 nucleic acids MAY BE PRESENT.   A presumptive positive result was obtained on the submitted specimen  and confirmed on repeat testing.  While 2019 novel coronavirus  (SARS-CoV-2) nucleic acids may be present in the submitted sample  additional confirmatory testing may be necessary for epidemiological  and / or clinical management purposes  to differentiate between  SARS-CoV-2 and other Sarbecovirus currently known to infect humans.  If clinically indicated additional testing with an alternate test  methodology [phone]) is advised. The SARS-CoV-2 RNA is generally  detectable in upper and lower respiratory sp ecimens during the acute  phase of infection. The expected result is Negative. Fact Sheet for Patients:  BoilerBrush.com.cy Fact Sheet for Healthcare Providers: https://pope.com/ This test is not yet approved or cleared by the [place] FDA and has been authorized for detection and/or diagnosis of SARS-CoV-2 by FDA under an Emergency Use Authorization (EUA).  This EUA will remain in effect (meaning this test can be used) for the duration of the COVID-19 declaration under Section 564(b)(1) of the Act, 21 U.S.C. section 360bbb-3(b)(1), unless the authorization is terminated or revoked sooner. Performed at [facility], [address], [place], [place] [address]   Culture, blood (routine x 2)     Status: None (Preliminary result)   Collection Time: 12/07/18  7:41 PM  Result Value Ref Range Status   Specimen Description RIGHT ANTECUBITAL  Final   Special Requests   Final    BOTTLES DRAWN AEROBIC AND ANAEROBIC Blood Culture adequate volume   Culture   Final    NO GROWTH 3 DAYS Performed at [facility], [address], [place], [place] [address]     Report Status PENDING  Incomplete  Culture, blood (routine x 2)     Status: None (Preliminary result)   Collection Time: 12/07/18  7:41 PM  Result Value Ref Range Status   Specimen Description BLOOD RIGHT FOREARM  Final   Special Requests   Final    BOTTLES DRAWN AEROBIC ONLY Blood Culture adequate volume   Culture   Final    NO GROWTH 3 DAYS Performed at [facility], [address],  [place], [place] [address]    Report Status PENDING  Incomplete  MRSA PCR Screening     Status: Abnormal   Collection Time: 12/09/18 10:30 AM  Result Value Ref Range Status   MRSA by PCR POSITIVE (A) NEGATIVE Final    Comment:        The GeneXpert MRSA Assay (FDA approved for NASAL specimens only), is one component of a comprehensive MRSA colonization surveillance program. It is not intended to diagnose MRSA infection nor to guide or monitor treatment for MRSA infections. RESULT CALLED TO, READ BACK BY AND VERIFIED WITH: BROWN S. AT 1322 ON [id number] BY THOMPSON S. Performed at [facility], [address] [place], [place] [address]          Radiology Studies: No results found.    Scheduled Meds: . amLODipine  5 mg Oral Daily  . escitalopram  10 mg Oral Daily  . hydrALAZINE  10 mg Intravenous Q6H  . insulin aspart  0-15 Units Subcutaneous TID WC  . isosorbide mononitrate  30 mg Oral Daily  . metoprolol tartrate  5 mg Intravenous Q6H  . metoprolol tartrate  50 mg Oral BID  . pantoprazole (PROTONIX) IV  40 mg Intravenous Q24H   Continuous Infusions: . lactated ringers 100 mL/hr at 12/10/18 0558  . piperacillin-tazobactam (ZOSYN)  IV 3.375 g (12/10/18 0537)     LOS: 3 days      Calvert Cantor, MD [facility] Pager: [email or web address] Password [facility] 12/10/2018, 11:41 AM

## 2018-12-11 DIAGNOSIS — D72829 Elevated white blood cell count, unspecified: Secondary | ICD-10-CM

## 2018-12-11 DIAGNOSIS — R10819 Abdominal tenderness, unspecified site: Secondary | ICD-10-CM

## 2018-12-11 DIAGNOSIS — K920 Hematemesis: Secondary | ICD-10-CM

## 2018-12-11 DIAGNOSIS — I160 Hypertensive urgency: Secondary | ICD-10-CM

## 2018-12-11 LAB — BASIC METABOLIC PANEL
Anion gap: 11 (ref 5–15)
BUN: 21 mg/dL (ref 8–23)
CO2: 19 mmol/L — ABNORMAL LOW (ref 22–32)
Calcium: 8 mg/dL — ABNORMAL LOW (ref 8.9–10.3)
Chloride: 111 mmol/L (ref 98–111)
Creatinine, Ser: 1.12 mg/dL — ABNORMAL HIGH (ref 0.44–1.00)
GFR calc Af Amer: 53 mL/min — ABNORMAL LOW (ref >60)
GFR calc non Af Amer: 46 mL/min — ABNORMAL LOW (ref >60)
Glucose, Bld: 203 mg/dL — ABNORMAL HIGH (ref 70–99)
Potassium: 4.5 mmol/L (ref 3.5–5.1)
Sodium: 141 mmol/L (ref 135–145)

## 2018-12-11 LAB — CBC
HCT: 35.7 % — ABNORMAL LOW (ref 36.0–46.0)
Hemoglobin: 11.5 g/dL — ABNORMAL LOW (ref 12.0–15.0)
MCH: 31.9 pg (ref 26.0–34.0)
MCHC: 32.2 g/dL (ref 30.0–36.0)
MCV: 98.9 fL (ref 80.0–100.0)
Platelets: 250 10*3/uL (ref 150–400)
RBC: 3.61 MIL/uL — ABNORMAL LOW (ref 3.87–5.11)
RDW: 13.8 % (ref 11.5–15.5)
WBC: 12 10*3/uL — ABNORMAL HIGH (ref 4.0–10.5)
nRBC: 0 % (ref 0.0–0.2)

## 2018-12-11 LAB — GLUCOSE, CAPILLARY
Glucose-Capillary: 170 mg/dL — ABNORMAL HIGH (ref 70–99)
Glucose-Capillary: 194 mg/dL — ABNORMAL HIGH (ref 70–99)
Glucose-Capillary: 156 mg/dL — ABNORMAL HIGH (ref 70–99)
Glucose-Capillary: 184 mg/dL — ABNORMAL HIGH (ref 70–99)

## 2018-12-11 MED ORDER — AMOXICILLIN-POT CLAVULANATE 875-125 MG PO TABS
1.0000 | ORAL_TABLET | Freq: Two times a day (BID) | ORAL | Status: DC
  Administered 2018-12-11 – 2018-12-12 (×2): 1 via ORAL
  Filled 2018-12-11 (×2): qty 1

## 2018-12-11 MED ORDER — HALOPERIDOL LACTATE 5 MG/ML IJ SOLN
2.5000 mg | Freq: Once | INTRAMUSCULAR | Status: DC
  Filled 2018-12-11: qty 1

## 2018-12-11 MED ORDER — LORAZEPAM 2 MG/ML IJ SOLN
1.0000 mg | Freq: Once | INTRAMUSCULAR | Status: AC
  Administered 2018-12-11: 1 mg via INTRAMUSCULAR
  Filled 2018-12-11: qty 1

## 2018-12-11 MED ORDER — HALOPERIDOL LACTATE 5 MG/ML IJ SOLN
2.5000 mg | Freq: Once | INTRAMUSCULAR | Status: AC
  Administered 2018-12-11: 2.5 mg via INTRAMUSCULAR

## 2018-12-11 MED ORDER — AMLODIPINE BESYLATE 5 MG PO TABS
10.0000 mg | ORAL_TABLET | Freq: Every day | ORAL | Status: DC
  Administered 2018-12-12: 10 mg via ORAL
  Filled 2018-12-11: qty 2

## 2018-12-11 NOTE — Progress Notes (Signed)
Foley removed at 1430.  Has not voided.  Scanned at 1800 and showed 68 mls . Sent message to Dr. Butler Denmark for guidelines for scanning and in and out cath.  Patient still combative with care

## 2018-12-11 NOTE — TOC Initial Note (Addendum)
Transition of Care [facility]) - Initial/Assessment Note    Patient Details  Name: [name] Mueller MRN: [id number] Date of Birth: Nov 01, 1934  [facility] CM/SW Contact:    Annice Needy, LCSW Phone Number: 12/11/2018, 3:44 PM  Clinical Narrative:                 Reviewed patient's record. Risk is low on readmission scale. Therapy evaluations were discussed. Mr. Linsley indicated that the family is not interested in SNF at this time. He indicated that they want HH. Patient has a shower chair and a walker that he rarely uses. Her is independent in ADLs. When needed her daughter comes to the home and sits in the bathroom when she bathes to provide assistance. Attending notified of HH needs.   Expected Discharge Plan: Home w Home Health Services Barriers to Discharge: No Barriers Identified   Patient Goals and CMS Choice   CMS Medicare.gov Compare Post Acute Care list provided to:: Other (Comment Required)(Grandson, Desma Maxim.)    Expected Discharge Plan and Services Expected Discharge Plan: Home w Home Health Services In-house Referral: Clinical Social Work Discharge Planning Services: CM Consult Post Acute Care Choice: Home Health Living arrangements for the past 2 months: Single Family Home                           HH Arranged: RN, OT, PT, Nurse's Aide HH Agency: [facility] Date HH Agency Contacted: 12/11/18 Time HH Agency Contacted: 1544 Representative spoke with at [facility] Agency: Alroy Bailiff  Prior Living Arrangements/Services Living arrangements for the past 2 months: Single Family Home Lives with:: Other (Comment), Spouse(Grandson, Desma Maxim ) Patient language and need for interpreter reviewed:: Yes Do you feel safe going back to the place where you live?: Yes      Need for Family Participation in Patient Care: Yes (Comment) Care giver support system in place?: Yes (comment) Current home services: DME Criminal Activity/Legal Involvement  Pertinent to Current Situation/Hospitalization: No - Comment as needed  Activities of Daily Living Home Assistive Devices/Equipment: Walker (specify type) ADL Screening (condition at time of admission) Patient's cognitive ability adequate to safely complete daily activities?: Yes Is the patient deaf or have difficulty hearing?: No Does the patient have difficulty seeing, even when wearing glasses/contacts?: No Does the patient have difficulty concentrating, remembering, or making decisions?: No Patient able to express need for assistance with ADLs?: Yes Does the patient have difficulty dressing or bathing?: No Independently performs ADLs?: Yes (appropriate for developmental age) Does the patient have difficulty walking or climbing stairs?: No Weakness of Legs: None Weakness of Arms/Hands: None  Permission Sought/Granted Permission sought to share information with : Family Supports          Permission granted to share info w Relationship: grandson, Gaynelle Belo     Emotional Assessment Appearance:: Appears stated age   Affect (typically observed): Unable to Assess Orientation: : Oriented to Self Alcohol / Substance Use: Not Applicable Psych Involvement: No (comment)  Admission diagnosis:  Hypertensive urgency [I16.0] Patient Active Problem List   Diagnosis Date Noted  . Acute urinary retention 12/10/2018  . Leukocytosis   . Hypertensive urgency 12/07/2018  . Headache 12/07/2018  . Hematemesis   . Respiratory failure with hypoxia (HCC) 05/30/2015  . Acute respiratory failure with hypoxia (HCC) 05/30/2015  . CAP (community acquired pneumonia) 05/30/2015  . Syncope 05/30/2015  . Metabolic encephalopathy 05/30/2015  . Diabetes mellitus without  complication (HCC)   . HYPERTENSION 05/03/2010  . Morbid obesity (HCC) 05/02/2010  . ANEMIA 05/02/2010  . Obstructive sleep apnea 05/02/2010  . CAD (coronary artery disease) 05/02/2010  . VENTRICULAR TACHYCARDIA 05/02/2010   PCP:   Elfredia NevinsFusco, [place], MD Pharmacy:   [facility] [place] - [phone] [place] [address] Phone: [phone] Fax: [phone]     Social Determinants of Health (SDOH) Interventions    Readmission Risk Interventions No flowsheet data found.

## 2018-12-11 NOTE — Progress Notes (Signed)
Pt confused. Pulled tele, IV & foley out this am. [place] of foley and IV cath were intact. New foley was inserted. IV attempts were unsuccessful. MD made aware of situation. Will continue to monitor patient.

## 2018-12-11 NOTE — Progress Notes (Signed)
Trying  to climb out of bed and chair.  Asking for clothes to go home. Not easily reoriented

## 2018-12-11 NOTE — Care Management Important Message (Signed)
Important Message  Patient Details  Name: [name] Mueller MRN: [id number] Date of Birth: 02/22/1935   Medicare Important Message Given:  Yes    Corey Harold 12/11/2018, 1:15 PM

## 2018-12-11 NOTE — Progress Notes (Addendum)
PROGRESS NOTE    [name] Mueller   ZOX:[id number]  DOB: 11/17/1934  DOA: 12/07/2018 PCP: Elfredia NevinsFusco, Lawrence, MD   Brief Narrative:  [name] Mueller is a84 y.o.female,w hypertension, hyperlipidemia, dm2, CAD, pulmonary hypertension, anemia, adrenal insufficiency, apparently presents with headache (severe). Pt was brought to ER and found to have bp markedly elevated.   Patient was admitted with severe headache likely related to hypertensive urgency.  She was noted to have some severe anxiety along with epigastric abdominal pain and profound hematemesis with blood clots noted on the morning of 5/11.  Blood pressures continue to remain somewhat elevated, but she has been started on a clear liquid diet by GI on 5/12 and will resume some of her home blood pressure medications today while maintaining on IV fluid.  Zosyn was started as well on 5/11 due to concerns for aspiration pneumonia and she is noted to have leukocytosis and ongoing low-grade temperatures.    Subjective: No complaints today. Per RN, she pulled out her IV and they are unable to get another one.     Assessment & Plan:   Principal Problem:   Hematemesis - has resolved - GI following - Hb is stable- no further vomiting - no need for endoscopy per GI - cont PPI  Active Problems:   Acute encephalopathy - cont treatment of underlying infection- she is improving slowly    Hypertensive urgency,   Headache - has resolved  - cont Amlodipine, Lopressor and Hydralazine -  holding Diovan due to AKI  Urinary retention - after I and O cath, very dark brown urine obtained- have asked for UA which is positive for RBCs and WBCs but rare bacteria (done after being started on antibiotics) - U culture is negative - follow leukocytosis- cont Zosyn - urine culture from 5/11 is negative - will try a voiding trial today  AKI - from urinary retention and dehydration- urine was extremely concentrated when admitted and is now beginning  to clear up-  cont IVF - holding Diovan  Hypokalemia - replacing orally and via IV  Suspected aspiration pneumonia - cont Zosyn- would recommend 7 days of antibiotics with stop date being on 5/17     Diabetes mellitus without complication  - cont SSI     Time spent in minutes: 35 DVT prophylaxis: SCD Code Status: Full code Family Communication:  Disposition Plan: PT recommends SNF Consultants:   GI Procedures:     Antimicrobials:  Anti-infectives (From admission, onward)   Start     Dose/Rate Route Frequency Ordered Stop   12/07/18 2000  piperacillin-tazobactam (ZOSYN) IVPB 3.375 g     3.375 g 12.5 mL/hr over 240 Minutes Intravenous Every 8 hours 12/07/18 1939         Objective: Vitals:   12/10/18 0525 12/10/18 1331 12/10/18 2140 12/11/18 0549  BP: (!) 147/68 (!) 124/59 (!) 177/78 (!) 140/94  Pulse: 91 85 79 81  Resp: 16 16    Temp: 98.5 F (36.9 C) 98 F (36.7 C)    TempSrc: Oral Oral    SpO2: 95% 98% 99%   Weight:      Height:        Intake/Output Summary (Last 24 hours) at 12/11/2018 1041 Last data filed at 12/10/2018 1705 Gross per 24 hour  Intake 4422.7 ml  Output 450 ml  Net 3972.7 ml   Filed Weights   12/07/18 0125 12/07/18 0848  Weight: 97.1 kg 86.8 kg    Examination: General exam: Appears comfortable  HEENT: PERRLA, oral mucosa moist, no sclera icterus or thrush Respiratory system: Clear to auscultation. Respiratory effort normal. Cardiovascular system: S1 & S2 heard,  No murmurs  Gastrointestinal system: Abdomen soft, non-tender, nondistended. Normal bowel sounds   Central nervous system: Alert and oriented to person and place today but not to time-  No focal neurological deficits. Extremities: No cyanosis, clubbing or edema Skin: No rashes or ulcers Psychiatry:  Mood & affect appropriate.    Data Reviewed: I have personally reviewed following labs and imaging studies  CBC: Recent Labs  Lab 12/07/18 0149 12/07/18 1054 12/08/18  0613 12/09/18 0502 12/10/18 0859  WBC 13.8*  --  18.8* 19.5* 15.3*  NEUTROABS 10.9*  --   --   --  11.7*  HGB 12.8 12.8 12.2 12.9 10.8*  HCT 38.4 38.4 37.2 38.9 33.6*  MCV 95.0  --  95.1 96.5 97.7  PLT 350  --  343 344 313   Basic Metabolic Panel: Recent Labs  Lab 12/07/18 0149 12/08/18 0613 12/09/18 0502 12/10/18 0859  NA 136 137 138 141  K 4.0 3.5 4.1 3.2*  CL 98 101 104 108  CO2 26 24 17* 19*  GLUCOSE 323* 105* 181* 177*  BUN 27*  CREATININE 1.10* 1.15* 1.62* 1.33*  CALCIUM 9.0 8.7* 8.3* 7.9*   GFR: Estimated Creatinine Clearance: 38.3 mL/min (A) (by C-G formula based on SCr of 1.33 mg/dL (H)). Liver Function Tests: Recent Labs  Lab 12/07/18 0149 12/08/18 0613 12/09/18 0502  AST 13* 24 23  ALT ALKPHOS 80 66 61  BILITOT 1.0 1.6* 2.0*  PROT 6.5 6.1* 6.1*  ALBUMIN 3.4* 3.3* 3.1*   No results for input(s): LIPASE, AMYLASE in the last 168 hours. No results for input(s): AMMONIA in the last 168 hours. Coagulation Profile: No results for input(s): INR, PROTIME in the last 168 hours. Cardiac Enzymes: Recent Labs  Lab 12/07/18 0608  TROPONINI <0.03   BNP (last 3 results) No results for input(s): PROBNP in the last 8760 hours. HbA1C: No results for input(s): HGBA1C in the last 72 hours. CBG: Recent Labs  Lab 12/10/18 0730 12/10/18 1110 12/10/18 1617 12/10/18 2004 12/11/18 0808  GLUCAP 121* 185* 233* 166* 170*   Lipid Profile: No results for input(s): CHOL, HDL, LDLCALC, TRIG, CHOLHDL, LDLDIRECT in the last 72 hours. Thyroid Function Tests: No results for input(s): TSH, T4TOTAL, FREET4, T3FREE, THYROIDAB in the last 72 hours. Anemia Panel: No results for input(s): VITAMINB12, FOLATE, FERRITIN, TIBC, IRON, RETICCTPCT in the last 72 hours. Urine analysis:    Component Value Date/Time   COLORURINE YELLOW 12/10/2018 1432   APPEARANCEUR CLOUDY (A) 12/10/2018 1432   LABSPEC 1.023 12/10/2018 1432   PHURINE 5.0 12/10/2018 1432    GLUCOSEU 150 (A) 12/10/2018 1432   HGBUR LARGE (A) 12/10/2018 1432   BILIRUBINUR NEGATIVE 12/10/2018 1432   KETONESUR 5 (A) 12/10/2018 1432   PROTEINUR 30 (A) 12/10/2018 1432   UROBILINOGEN 0.2 05/31/2015 0045   NITRITE NEGATIVE 12/10/2018 1432   LEUKOCYTESUR NEGATIVE 12/10/2018 1432   Sepsis Labs: (procalcitonin:4,lacticidven:4) ) Recent Results (from the past 240 hour(s))  Urine Culture     Status: None   Collection Time: 12/07/18  4:38 AM  Result Value Ref Range Status   Specimen Description   Final    URINE, CLEAN CATCH Performed at [facility], [address], [place], [place] [address]    Special Requests   Final    NONE Performed at [facility],  [address], [place], [place] [address]    Culture   Final    NO GROWTH Performed at [facility], [address], [place], [place] [address]    Report Status 12/08/2018 FINAL  Final  SARS Coronavirus 2 [facility] order, Performed in [facility] hospital lab)     Status: None   Collection Time: 12/07/18  4:45 AM  Result Value Ref Range Status   SARS Coronavirus 2 NEGATIVE NEGATIVE Final    Comment: (NOTE) If result is NEGATIVE SARS-CoV-2 target nucleic acids are NOT DETECTED. The SARS-CoV-2 RNA is generally detectable in upper and lower  respiratory specimens during the acute phase of infection. The lowest  concentration of SARS-CoV-2 viral copies this assay can detect is 250  copies / mL. A negative result does not preclude SARS-CoV-2 infection  and should not be used as the sole basis for treatment or other  patient management decisions.  A negative result may occur with  improper specimen collection / handling, submission of specimen other  than nasopharyngeal swab, presence of viral mutation(s) within the  areas targeted by this assay, and inadequate number of viral copies  (<250 copies / mL). A negative result must be combined with clinical  observations, patient history, and epidemiological  information. If result is POSITIVE SARS-CoV-2 target nucleic acids are DETECTED. The SARS-CoV-2 RNA is generally detectable in upper and lower  respiratory specimens dur ing the acute phase of infection.  Positive  results are indicative of active infection with SARS-CoV-2.  Clinical  correlation with patient history and other diagnostic information is  necessary to determine patient infection status.  Positive results do  not rule out bacterial infection or co-infection with other viruses. If result is PRESUMPTIVE POSTIVE SARS-CoV-2 nucleic acids MAY BE PRESENT.   A presumptive positive result was obtained on the submitted specimen  and confirmed on repeat testing.  While 2019 novel coronavirus  (SARS-CoV-2) nucleic acids may be present in the submitted sample  additional confirmatory testing may be necessary for epidemiological  and / or clinical management purposes  to differentiate between  SARS-CoV-2 and other Sarbecovirus currently known to infect humans.  If clinically indicated additional testing with an alternate test  methodology [id number]) is advised. The SARS-CoV-2 RNA is generally  detectable in upper and lower respiratory sp ecimens during the acute  phase of infection. The expected result is Negative. Fact Sheet for Patients:  BoilerBrush.com.cy Fact Sheet for Healthcare Providers: https://pope.com/ This test is not yet approved or cleared by the [place] FDA and has been authorized for detection and/or diagnosis of SARS-CoV-2 by FDA under an Emergency Use Authorization (EUA).  This EUA will remain in effect (meaning this test can be used) for the duration of the COVID-19 declaration under Section 564(b)(1) of the Act, 21 U.S.C. section 360bbb-3(b)(1), unless the authorization is terminated or revoked sooner. Performed at [facility], [address], [place], [place] [address]   Culture, blood (routine x 2)      Status: None (Preliminary result)   Collection Time: 12/07/18  7:41 PM  Result Value Ref Range Status   Specimen Description RIGHT ANTECUBITAL  Final   Special Requests   Final    BOTTLES DRAWN AEROBIC AND ANAEROBIC Blood Culture adequate volume   Culture   Final    NO GROWTH 4 DAYS Performed at [facility], [address], [place], [place] [address]    Report Status PENDING  Incomplete  Culture, blood (routine x 2)  Status: None (Preliminary result)   Collection Time: 12/07/18  7:41 PM  Result Value Ref Range Status   Specimen Description BLOOD RIGHT FOREARM  Final   Special Requests   Final    BOTTLES DRAWN AEROBIC ONLY Blood Culture adequate volume   Culture   Final    NO GROWTH 4 DAYS Performed at [facility], [address], [place], [place] [address]    Report Status PENDING  Incomplete  MRSA PCR Screening     Status: Abnormal   Collection Time: 12/09/18 10:30 AM  Result Value Ref Range Status   MRSA by PCR POSITIVE (A) NEGATIVE Final    Comment:        The GeneXpert MRSA Assay (FDA approved for NASAL specimens only), is one component of a comprehensive MRSA colonization surveillance program. It is not intended to diagnose MRSA infection nor to guide or monitor treatment for MRSA infections. RESULT CALLED TO, READ BACK BY AND VERIFIED WITH: BROWN S. AT 1322 ON 208022 BY THOMPSON S. Performed at [facility], [address] [place], [place] [address]   Culture, Urine     Status: None   Collection Time: 12/09/18 11:33 AM  Result Value Ref Range Status   Specimen Description   Final    URINE, CATHETERIZED Performed at [facility], [address] [place], [place] [address]    Special Requests   Final    NONE Performed at [facility], [address], [place], [place] [address]    Culture   Final    NO GROWTH Performed at [facility], [address] [place], [place] [address]    Report Status 12/10/2018 FINAL  Final         Radiology Studies:  No results found.    Scheduled Meds: . [START ON 12/12/2018] amLODipine  10 mg Oral Daily  . Chlorhexidine Gluconate Cloth  6 each Topical Q0600  . escitalopram  10 mg Oral Daily  . hydrALAZINE  25 mg Oral Q8H  . insulin aspart  0-15 Units Subcutaneous TID WC  . isosorbide mononitrate  30 mg Oral Daily  . metoprolol tartrate  50 mg Oral BID  . mupirocin ointment  1 application Nasal BID  . pantoprazole (PROTONIX) IV  40 mg Intravenous Q24H   Continuous Infusions: . 0.9 % NaCl with KCl 40 mEq / L 125 mL/hr (12/11/18 0446)  . lactated ringers Stopped (12/10/18 1221)  . piperacillin-tazobactam (ZOSYN)  IV 3.375 g (12/10/18 2231)     LOS: 4 days      Calvert Cantor, MD [facility] Pager: [email or web address] Password [facility] 12/11/2018, 10:41 AM

## 2018-12-11 NOTE — Progress Notes (Signed)
  Subjective:  Patient has no complaints.  She says she has good appetite and ate most of her breakfast and lunch.  She denies nausea vomiting heartburn dysphagia or abdominal pain.  No history of melena.  She states she lives at home with her husband and their grandson.  Objective: Blood pressure 127/64, pulse 77, temperature 97.9 F (36.6 C), temperature source Oral, resp. rate 18, height '5\' 9"'$  (1.753 m), weight 86.8 kg, SpO2 98 %. Patient is alert and in no acute distress. Abdomen is full.  Bowel sounds are normal.  On palpation is soft and nontender with organomegaly or masses.  Labs/studies Results:  CBC Latest Ref Rng & Units 12/11/2018 12/10/2018 12/09/2018  WBC 4.0 - 10.5 K/uL 12.0(H) 15.3(H) 19.5(H)  Hemoglobin 12.0 - 15.0 g/dL 11.5(L) 10.8(L) 12.9  Hematocrit 36.0 - 46.0 % 35.7(L) 33.6(L) 38.9  Platelets 150 - 400 K/uL 250 313 344    CMP Latest Ref Rng & Units 12/11/2018 12/10/2018 12/09/2018  Glucose 70 - 99 mg/dL 203(H) 177(H) 181(H)  BUN 8 - 23 mg/dL 21 27(H) 22  Creatinine 0.44 - 1.00 mg/dL 1.12(H) 1.33(H) 1.62(H)  Sodium 135 - 145 mmol/L 141 141 138  Potassium 3.5 - 5.1 mmol/L 4.5 3.2(L) 4.1  Chloride 98 - 111 mmol/L 111 108 104  CO2 22 - 32 mmol/L 19(L) 19(L) 17(L)  Calcium 8.9 - 10.3 mg/dL 8.0(L) 7.9(L) 8.3(L)  Total Protein 6.5 - 8.1 g/dL - - 6.1(L)  Total Bilirubin 0.3 - 1.2 mg/dL - - 2.0(H)  Alkaline Phos 38 - 126 U/L - - 61  AST 15 - 41 U/L - - 23  ALT 0 - 44 U/L - - 14    Hepatic Function Latest Ref Rng & Units 12/09/2018 12/08/2018 12/07/2018  Total Protein 6.5 - 8.1 g/dL 6.1(L) 6.1(L) 6.5  Albumin 3.5 - 5.0 g/dL 3.1(L) 3.3(L) 3.4(L)  AST 15 - 41 U/L 23 24 13(L)  ALT 0 - 44 U/L '14 12 9  '$ Alk Phosphatase 38 - 126 U/L 61 66 80  Total Bilirubin 0.3 - 1.2 mg/dL 2.0(H) 1.6(H) 1.0      Assessment:  #1.  Coffee-ground emesis in the setting of acute illness.  No evidence of ongoing GI bleed.  Hemoglobin is up.  She does not have any symptoms to warrant further  investigation.  She will need EGD if she has emesis or melena.   #2.  Metabolic encephalopathy most likely due to UTI setting of urinary retention.  Patient is on Zosyn.  Renal function is improving.  #3.  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  No further work-up indicated. #4.   Recommendations:  No further GI work-up at this time. Will sign off. Please call if patient has emesis or melena.

## 2018-12-11 NOTE — Progress Notes (Signed)
Physical Therapy Treatment Patient Details Name: [name] Mueller MRN: [id number] DOB: [birth year] Today's Date: 12/11/2018    History of Present Illness [name] Mueller  is a 84 y.o. female, w hypertension, hyperlipidemia, dm2, CAD, pulmonary hypertension, anemia, adrenal insufficiency, apparently presents with headache (severe).  Pt was brought to ER and found to have bp markedly elevated. Pt apparently denies fever, chills, cough, cp, palp, sob, n/v, abd pain, diarrhea, brbpr, dysuria.     PT Comments    Pt required some encouragement to participate with therapy today.  Pt somewhat confused on location and day (believes she is in hospital in [place] and unsure when she was admitted to hospital).  Pt improved bed mobility and transfer training with just min A and cueing for handplacement to assist.  Pt able to walk to restroom for bowel movement with min A, cueing to stand within walker.  Following restroom break, pt with increased fatigue required to sit prior washing hands at sink.  Pt given hand sanitizer at chair.  EOS pt left in chair with call bell within reach and chair alarm set.  RN aware of status.     Follow Up Recommendations  SNF     Equipment Recommendations  None recommended by PT    Recommendations for Other Services       Precautions / Restrictions Precautions Precautions: Fall Restrictions Weight Bearing Restrictions: No    Mobility  Bed Mobility Overal bed mobility: Needs Assistance Bed Mobility: Supine to Sit     Supine to sit: Min assist     General bed mobility comments: increased time, slow labored movement.  Min A with scooting to bedside  Transfers Overall transfer level: Needs assistance Equipment used: Rolling walker (2 wheeled) Transfers: Sit to/from Stand Sit to Stand: Min assist         General transfer comment: increased time, labored movement.  Cueing to push from bed vs reach for RW  Ambulation/Gait Ambulation/Gait assistance: Min  assist Gait Distance (Feet): 14 Feet Assistive device: Rolling walker (2 wheeled) Gait Pattern/deviations: Decreased step length - right;Decreased step length - left;Decreased stride length Gait velocity: slow   General Gait Details: slow labored movement to restroom.  When finished with restroom, increased fatigue due to weakness   Stairs             Wheelchair Mobility    Modified Rankin (Stroke Patients Only)       Balance                                            Cognition Arousal/Alertness: Awake/alert Behavior During Therapy: WFL for tasks assessed/performed;Anxious Overall Cognitive Status: Within Functional Limits for tasks assessed                                 General Comments: required occasional encouragement to participate.  Pt confused on current location, believes she is in [place] and unsure how long she has been in hospital for.        Exercises      General Comments        Pertinent Vitals/Pain Pain Assessment: No/denies pain    Home Living                      Prior Function  PT Goals (current goals can now be found in the care plan section)      Frequency    Min 3X/week      PT Plan Current plan remains appropriate    Co-evaluation              AM-PAC PT "6 Clicks" Mobility   Outcome Measure  Help needed turning from your back to your side while in a flat bed without using bedrails?: A Lot Help needed moving from lying on your back to sitting on the side of a flat bed without using bedrails?: A Lot Help needed moving to and from a bed to a chair (including a wheelchair)?: A Lot Help needed standing up from a chair using your arms (e.g., wheelchair or bedside chair)?: A Lot Help needed to walk in hospital room?: A Lot Help needed climbing 3-5 steps with a railing? : Total 6 Click Score: 11    End of Session Equipment Utilized During Treatment: Gait  belt Activity Tolerance: Patient tolerated treatment well;Patient limited by fatigue Patient left: in chair;with call bell/phone within reach;with chair alarm set Nurse Communication: Mobility status PT Visit Diagnosis: Unsteadiness on feet (R26.81);Other abnormalities of gait and mobility (R26.89);Muscle weakness (generalized) (M62.81)     Time: 6237-6283 PT Time Calculation (min) (ACUTE ONLY): 37 min  Charges:  $Therapeutic Activity: 23-37 mins                     [address], LPTA; CBIS [phone]  Juel Burrow 12/11/2018, 10:09 AM

## 2018-12-11 NOTE — Progress Notes (Signed)
Patient extremely irritated and  agitated.  Unable to calm patient down.  Patient screaming, threatening to murder staff and beat "our asses", states she is going home.  Called patient's grandson he talked to her on phone to attempt to call her down, did not work.  Grandson offered to come in person and stay with her for short time to calm her.  Contacted on call MD, given order for 2.5mg  Haldol Im.  Medication given to patient at approximately 2010 with patient calming down and becoming less agitated/irritated.

## 2018-12-12 DIAGNOSIS — D649 Anemia, unspecified: Secondary | ICD-10-CM

## 2018-12-12 LAB — CULTURE, BLOOD (ROUTINE X 2)
Culture: NO GROWTH
Culture: NO GROWTH
Special Requests: ADEQUATE
Special Requests: ADEQUATE

## 2018-12-12 LAB — GLUCOSE, CAPILLARY
Glucose-Capillary: 153 mg/dL — ABNORMAL HIGH (ref 70–99)
Glucose-Capillary: 129 mg/dL — ABNORMAL HIGH (ref 70–99)
Glucose-Capillary: 146 mg/dL — ABNORMAL HIGH (ref 70–99)

## 2018-12-12 LAB — CBC
HCT: 34.2 % — ABNORMAL LOW (ref 36.0–46.0)
Hemoglobin: 11 g/dL — ABNORMAL LOW (ref 12.0–15.0)
MCH: 31.4 pg (ref 26.0–34.0)
MCHC: 32.2 g/dL (ref 30.0–36.0)
MCV: 97.7 fL (ref 80.0–100.0)
Platelets: 321 10*3/uL (ref 150–400)
RBC: 3.5 MIL/uL — ABNORMAL LOW (ref 3.87–5.11)
RDW: 13.6 % (ref 11.5–15.5)
WBC: 9.4 10*3/uL (ref 4.0–10.5)
nRBC: 0 % (ref 0.0–0.2)

## 2018-12-12 LAB — BASIC METABOLIC PANEL
Anion gap: 8 (ref 5–15)
BUN: 18 mg/dL (ref 8–23)
CO2: 23 mmol/L (ref 22–32)
Calcium: 8.2 mg/dL — ABNORMAL LOW (ref 8.9–10.3)
Chloride: 109 mmol/L (ref 98–111)
Creatinine, Ser: 1 mg/dL (ref 0.44–1.00)
GFR calc Af Amer: >60 mL/min (ref >60)
GFR calc non Af Amer: 52 mL/min — ABNORMAL LOW (ref >60)
Glucose, Bld: 163 mg/dL — ABNORMAL HIGH (ref 70–99)
Potassium: 4 mmol/L (ref 3.5–5.1)
Sodium: 140 mmol/L (ref 135–145)

## 2018-12-12 MED ORDER — HYDRALAZINE HCL 25 MG PO TABS: 25.0000 mg | ORAL_TABLET | Freq: Three times a day (TID) | ORAL | 0 refills | Status: AC

## 2018-12-12 MED ORDER — TAMSULOSIN HCL 0.4 MG PO CAPS: 0.4000 mg | ORAL_CAPSULE | Freq: Every day | ORAL | 0 refills | Status: AC

## 2018-12-12 MED ORDER — TAMSULOSIN HCL 0.4 MG PO CAPS
0.8000 mg | ORAL_CAPSULE | Freq: Once | ORAL | Status: AC
  Administered 2018-12-12: 0.8 mg via ORAL
  Filled 2018-12-12: qty 2

## 2018-12-12 MED ORDER — ACETAMINOPHEN 650 MG RE SUPP: 650.0000 mg | Freq: Four times a day (QID) | RECTAL | 0 refills | Status: AC | PRN

## 2018-12-12 MED ORDER — AMOXICILLIN-POT CLAVULANATE 875-125 MG PO TABS: 1.0000 | ORAL_TABLET | Freq: Two times a day (BID) | ORAL | 0 refills | Status: AC

## 2018-12-12 NOTE — Discharge Summary (Signed)
Physician Discharge Summary  [name] Mueller:[id number] DOB: [birth year] DOA: 12/07/2018  PCP: Elfredia Nevins, MD  Admit date: 12/07/2018 Discharge date: 12/20/2018  Admitted From: home Disposition:  home   Discharge Condition:  stable  Consultations:  GI    Discharge Diagnoses:  Principal Problem:   Hematemesis Active Problems:   Metabolic encephalopathy   Hypertensive urgency   Leukocytosis   Acute urinary retention   Morbid obesity (HCC)   ANEMIA   Diabetes mellitus without complication (HCC)   Headache  Brief Summary: [name] Mueller is a84 y.o.female,w hypertension, hyperlipidemia, dm2, CAD, pulmonary hypertension, anemia, adrenal insufficiency, apparently presents with headache (severe). Pt was brought to ER and found to have bp markedly elevated.   Patient was admitted with severe headache likely related to hypertensive urgency.She was noted to have some severe anxiety along with epigastric abdominal pain and profound hematemesis with blood clots noted on the morning of 5/11. Blood pressures continue to remain somewhat elevated, but she has been started on a clear liquid diet by GI on 5/12 and will resume some of her home blood pressure medications today while maintaining on IV fluid. Zosyn was started as well on 5/11 due to concerns for aspiration pneumonia and she is noted to have leukocytosis and ongoing low-grade temperatures.    Hospital Course:  Principal Problem:   Hematemesis - has resolved - GI following - Hb is stable- no further vomiting - no need for endoscopy at this time per GI - cont PPI  Active Problems:   Acute encephalopathy - cont treatment of underlying infection- she is improving slowly    Hypertensive urgency,   Headache - has resolved  - cont Amlodipine, Lopressor and Hydralazine (new medications) -  holding Diovan due to AKI- as AKI has resolved, can resume Diovan  Urinary retention - after I and O cath, very dark  brown urine obtained- have asked for UA which is positive for RBCs and WBCs but rare bacteria (done after being started on antibiotics ) - U culture is negative likely because she had received antibiotics - leukocytosis has resolved -  She is able to void without the foley catheter  AKI - from urinary retention and dehydration- urine was extremely concentrated when admitted and is now beginning to clear up - Cr has returned to normal with IVF  Hypokalemia - replaced  Suspected aspiration pneumonia - Treated with Zosyn and later Augmentin     Diabetes mellitus without complication  - cont SSI       Discharge Exam: Vitals:   12/12/18 0821 12/12/18 1400  BP:  120/67  Pulse:  85  Resp:  16  Temp:    SpO2: 94% 98%   Vitals:   12/11/18 2114 12/12/18 0550 12/12/18 0821 12/12/18 1400  BP: (!) 166/88 (!) 159/78  120/67  Pulse: 86 91  85  Resp: Temp: 97.8 F (36.6 C) 98.3 F (36.8 C)    TempSrc: Oral Oral    SpO2: 98% 93% 94% 98%  Weight:      Height:        General: Pt is alert, awake, not in acute distress Cardiovascular: RRR, S1/S2 +, no rubs, no gallops Respiratory: CTA bilaterally, no wheezing, no rhonchi Abdominal: Soft, NT, ND, bowel sounds + Extremities: no edema, no cyanosis   Discharge Instructions  Discharge Instructions    Diet - low sodium heart healthy   Complete by:  As directed    Increase activity slowly  Complete by:  As directed      Allergies as of 12/12/2018      Reactions   Ambien [zolpidem] Other (See Comments)   confusion   Ezetimibe Other (See Comments)   unknown   Lisinopril Cough      Medication List    TAKE these medications   acetaminophen 650 MG suppository Commonly known as:  TYLENOL Place 1 suppository (650 mg total) rectally every 6 (six) hours as needed for fever, mild pain or moderate pain.   amLODipine 5 MG tablet Commonly known as:  NORVASC Take 1 tablet (5 mg total) by mouth daily.    amoxicillin-clavulanate 875-125 MG tablet Commonly known as:  AUGMENTIN Take 1 tablet by mouth every 12 (twelve) hours.   atorvastatin 10 MG tablet Commonly known as:  LIPITOR Take 1 tablet by mouth daily.   clopidogrel 75 MG tablet Commonly known as:  PLAVIX Take 75 mg by mouth daily.   esomeprazole 40 MG capsule Commonly known as:  NEXIUM Take 40 mg by mouth daily before breakfast.   folic acid 1 MG tablet Commonly known as:  FOLVITE Take 1 mg by mouth daily.   glimepiride 4 MG tablet Commonly known as:  AMARYL Take 4 mg by mouth 2 (two) times daily.   hydrALAZINE 25 MG tablet Commonly known as:  APRESOLINE Take 1 tablet (25 mg total) by mouth every 8 (eight) hours.   ipratropium 0.02 % nebulizer solution Commonly known as:  ATROVENT Take 2.5 mLs (0.5 mg total) by nebulization every 4 (four) hours as needed for wheezing or shortness of breath.   isosorbide mononitrate 30 MG 24 hr tablet Commonly known as:  IMDUR Take 30 mg by mouth daily.   Lexapro 10 MG tablet Generic drug:  escitalopram Take 10 mg by mouth daily.   metFORMIN 1000 MG tablet Commonly known as:  GLUCOPHAGE Take 1,000 mg by mouth 2 (two) times daily with a meal.   metoprolol tartrate 50 MG tablet Commonly known as:  LOPRESSOR Take 1 tablet (50 mg total) by mouth 2 (two) times daily.   tamsulosin 0.4 MG Caps capsule Commonly known as:  Flomax Take 1 capsule (0.4 mg total) by mouth daily after supper.   topiramate 25 MG tablet Commonly known as:  TOPAMAX Take 25 mg by mouth 2 (two) times daily.   valsartan 160 MG tablet Commonly known as:  DIOVAN Take 160 mg by mouth daily.   VITAMIN B-12 IJ Inject as directed every 30 (thirty) days.      Follow-up Information    Elfredia Nevins, MD Follow up.   Specialty:  Internal Medicine Contact information: [address] [place] [address] [phone]          Allergies  Allergen Reactions  . Ambien [Zolpidem] Other (See  Comments)    confusion  . Ezetimibe Other (See Comments)    unknown  . Lisinopril Cough     Procedures/Studies:   Ct Angio Head W Or Wo Contrast  Result Date: 12/07/2018 CLINICAL DATA:  Altered mental status. Severe headache. Hypertension. EXAM: CT HEAD WITHOUT CONTRAST CT ANGIOGRAPHY OF THE HEAD AND NECK TECHNIQUE: Contiguous axial images were obtained from the base of the skull through the vertex without intravenous contrast. Multidetector CT imaging of the head and neck was performed using the standard protocol during bolus administration of intravenous contrast. Multiplanar CT image reconstructions and MIPs were obtained to evaluate the vascular anatomy. Carotid stenosis measurements (when applicable) are obtained utilizing NASCET criteria, using the  distal internal carotid diameter as the denominator. CONTRAST:  OMNIPAQUE IOHEXOL 350 MG/ML SOLN COMPARISON:  Chest CT 05/30/2015 FINDINGS: CT HEAD FINDINGS Brain: There is no mass, hemorrhage or extra-axial collection. There is generalized atrophy without lobar predilection. There is hypoattenuation of the periventricular white matter, most commonly indicating chronic ischemic microangiopathy. Skull: The visualized skull base, calvarium and extracranial soft tissues are normal. Sinuses/Orbits: No fluid levels or advanced mucosal thickening of the visualized paranasal sinuses. No mastoid or middle ear effusion. The orbits are normal. CTA NECK FINDINGS SKELETON: There is moderate-to-severe spinal canal stenosis at C5-6 due to bulky calcifications along the posterior longitudinal ligament. OTHER NECK: Multiple enlarged right mediastinal lymph nodes measuring up to 10 mm. There are multiple subcentimeter hypodense nodules of thyroid gland. UPPER CHEST: No pneumothorax or pleural effusion. No nodules or masses. AORTIC ARCH: There is mild calcific atherosclerosis of the aortic arch. There is no aneurysm, dissection or hemodynamically significant  stenosis of the visualized ascending aorta and aortic arch. Conventional 3 vessel aortic branching pattern. The visualized proximal subclavian arteries are widely patent. RIGHT CAROTID SYSTEM: --Common carotid artery: Widely patent origin without common carotid artery dissection or aneurysm. --Internal carotid artery: No dissection, occlusion or aneurysm. Mild atherosclerotic calcification at the carotid bifurcation without hemodynamically significant stenosis. --External carotid artery: No acute abnormality. LEFT CAROTID SYSTEM: --Common carotid artery: Widely patent origin without common carotid artery dissection or aneurysm. --Internal carotid artery: No dissection, occlusion or aneurysm. Mild atherosclerotic calcification at the carotid bifurcation without hemodynamically significant stenosis. --External carotid artery: No acute abnormality. VERTEBRAL ARTERIES: Right dominant configuration. Both origins are normal. No dissection, occlusion or flow-limiting stenosis to the skull base. CTA HEAD FINDINGS POSTERIOR CIRCULATION: --Vertebral arteries: Moderate atherosclerotic calcification of both V4 segments. --Posterior inferior cerebellar arteries (PICA): Patent origins from the vertebral arteries. --Anterior inferior cerebellar arteries (AICA): Patent origins from the basilar artery. --Basilar artery: Normal. --Superior cerebellar arteries: Normal. --Posterior cerebral arteries (PCA): Normal. Both originate from the basilar artery. Posterior communicating arteries (p-comm) are diminutive or absent. ANTERIOR CIRCULATION: --Intracranial internal carotid arteries: Atherosclerotic calcification of the internal carotid arteries at the skull base without hemodynamically significant stenosis. --Anterior cerebral arteries (ACA): Normal. Hypoplastic right A1 segment, normal variant. --Middle cerebral arteries (MCA): Normal. VENOUS SINUSES: As permitted by contrast timing, patent. ANATOMIC VARIANTS: None Review of the MIP  images confirms the above findings. IMPRESSION: 1. No emergent large vessel occlusion or high-grade stenosis. 2. Upper mediastinal lymphadenopathy, unchanged compared to 05/30/2015. 3. Moderate-to-severe spinal canal stenosis at C5-6 due to posterior longitudinal ligament calcification. Electronically Signed   By: Deatra Robinson M.D.   On: 12/07/2018 03:08   Ct Head Wo Contrast  Result Date: 12/07/2018 CLINICAL DATA:  Altered mental status. Severe headache. Hypertension. EXAM: CT HEAD WITHOUT CONTRAST CT ANGIOGRAPHY OF THE HEAD AND NECK TECHNIQUE: Contiguous axial images were obtained from the base of the skull through the vertex without intravenous contrast. Multidetector CT imaging of the head and neck was performed using the standard protocol during bolus administration of intravenous contrast. Multiplanar CT image reconstructions and MIPs were obtained to evaluate the vascular anatomy. Carotid stenosis measurements (when applicable) are obtained utilizing NASCET criteria, using the distal internal carotid diameter as the denominator. CONTRAST:  OMNIPAQUE IOHEXOL 350 MG/ML SOLN COMPARISON:  Chest CT 05/30/2015 FINDINGS: CT HEAD FINDINGS Brain: There is no mass, hemorrhage or extra-axial collection. There is generalized atrophy without lobar predilection. There is hypoattenuation of the periventricular white matter, most commonly indicating chronic ischemic microangiopathy.  Skull: The visualized skull base, calvarium and extracranial soft tissues are normal. Sinuses/Orbits: No fluid levels or advanced mucosal thickening of the visualized paranasal sinuses. No mastoid or middle ear effusion. The orbits are normal. CTA NECK FINDINGS SKELETON: There is moderate-to-severe spinal canal stenosis at C5-6 due to bulky calcifications along the posterior longitudinal ligament. OTHER NECK: Multiple enlarged right mediastinal lymph nodes measuring up to 10 mm. There are multiple subcentimeter hypodense nodules of  thyroid gland. UPPER CHEST: No pneumothorax or pleural effusion. No nodules or masses. AORTIC ARCH: There is mild calcific atherosclerosis of the aortic arch. There is no aneurysm, dissection or hemodynamically significant stenosis of the visualized ascending aorta and aortic arch. Conventional 3 vessel aortic branching pattern. The visualized proximal subclavian arteries are widely patent. RIGHT CAROTID SYSTEM: --Common carotid artery: Widely patent origin without common carotid artery dissection or aneurysm. --Internal carotid artery: No dissection, occlusion or aneurysm. Mild atherosclerotic calcification at the carotid bifurcation without hemodynamically significant stenosis. --External carotid artery: No acute abnormality. LEFT CAROTID SYSTEM: --Common carotid artery: Widely patent origin without common carotid artery dissection or aneurysm. --Internal carotid artery: No dissection, occlusion or aneurysm. Mild atherosclerotic calcification at the carotid bifurcation without hemodynamically significant stenosis. --External carotid artery: No acute abnormality. VERTEBRAL ARTERIES: Right dominant configuration. Both origins are normal. No dissection, occlusion or flow-limiting stenosis to the skull base. CTA HEAD FINDINGS POSTERIOR CIRCULATION: --Vertebral arteries: Moderate atherosclerotic calcification of both V4 segments. --Posterior inferior cerebellar arteries (PICA): Patent origins from the vertebral arteries. --Anterior inferior cerebellar arteries (AICA): Patent origins from the basilar artery. --Basilar artery: Normal. --Superior cerebellar arteries: Normal. --Posterior cerebral arteries (PCA): Normal. Both originate from the basilar artery. Posterior communicating arteries (p-comm) are diminutive or absent. ANTERIOR CIRCULATION: --Intracranial internal carotid arteries: Atherosclerotic calcification of the internal carotid arteries at the skull base without hemodynamically significant stenosis.  --Anterior cerebral arteries (ACA): Normal. Hypoplastic right A1 segment, normal variant. --Middle cerebral arteries (MCA): Normal. VENOUS SINUSES: As permitted by contrast timing, patent. ANATOMIC VARIANTS: None Review of the MIP images confirms the above findings. IMPRESSION: 1. No emergent large vessel occlusion or high-grade stenosis. 2. Upper mediastinal lymphadenopathy, unchanged compared to 05/30/2015. 3. Moderate-to-severe spinal canal stenosis at C5-6 due to posterior longitudinal ligament calcification. Electronically Signed   By: Kevin  Herman M.D.   On: 12/07/2018 03:08   Ct Angio Neck W And/or Wo Contrast  Result Date: 12/07/2018 CLINICAL DATA:  Altered mental status. Severe headache. Hypertension. EXAM: CT HEAD WITHOUT CONTRAST CT ANGIOGRAPHY OF THE HEAD AND NECK TECHNIQUE: Contiguous axial images were obtained from the base of the skull through the vertex without intravenous contrast. Multidetector CT imaging of the head and neck was performed using the standard protocol during bolus administration of intravenous contrast. Multiplanar CT image reconstructions and MIPs were obtained to evaluate the vascular anatomy. Carotid stenosis measurements (when applicable) are obtained utilizing NASCET criteria, using the distal internal carotid diameter as the denominator. CONTRAST:  <[id number] h [place] [id number] h [id number] h [id number] SalvagetUE IOHEXOL 350 MG/ML SOLN COMPARISON:  Chest CT 05/30/2015 FINDINGS: CT HEAD FINDINGS Brain: There is no mass, hemorrhage or extra-axial collection. There is generalized atrophy without lobar predilection. There is hypoattenuation of the periventricular white matter, most commonly indicating chronic ischemic microangiopathy. Skull: The visualized skull base, calvarium and extracranial soft tissues are normal. Sinuses/Orbits: No fluid levels or advanced mucosal thickening of the visualized paranasal sinuses. No mastoid or middle ear effusion. The orbits are normal. CTA NECK FINDINGS SKELETON: There is  moderate-to-severe spinal canal stenosis at C5-6 due to bulky  calcifications along the posterior longitudinal ligament. OTHER NECK: Multiple enlarged right mediastinal lymph nodes measuring up to 10 mm. There are multiple subcentimeter hypodense nodules of thyroid gland. UPPER CHEST: No pneumothorax or pleural effusion. No nodules or masses. AORTIC ARCH: There is mild calcific atherosclerosis of the aortic arch. There is no aneurysm, dissection or hemodynamically significant stenosis of the visualized ascending aorta and aortic arch. Conventional 3 vessel aortic branching pattern. The visualized proximal subclavian arteries are widely patent. RIGHT CAROTID SYSTEM: --Common carotid artery: Widely patent origin without common carotid artery dissection or aneurysm. --Internal carotid artery: No dissection, occlusion or aneurysm. Mild atherosclerotic calcification at the carotid bifurcation without hemodynamically significant stenosis. --External carotid artery: No acute abnormality. LEFT CAROTID SYSTEM: --Common carotid artery: Widely patent origin without common carotid artery dissection or aneurysm. --Internal carotid artery: No dissection, occlusion or aneurysm. Mild atherosclerotic calcification at the carotid bifurcation without hemodynamically significant stenosis. --External carotid artery: No acute abnormality. VERTEBRAL ARTERIES: Right dominant configuration. Both origins are normal. No dissection, occlusion or flow-limiting stenosis to the skull base. CTA HEAD FINDINGS POSTERIOR CIRCULATION: --Vertebral arteries: Moderate atherosclerotic calcification of both V4 segments. --Posterior inferior cerebellar arteries (PICA): Patent origins from the vertebral arteries. --Anterior inferior cerebellar arteries (AICA): Patent origins from the basilar artery. --Basilar artery: Normal. --Superior cerebellar arteries: Normal. --Posterior cerebral arteries (PCA): Normal. Both originate from the basilar artery. Posterior  communicating arteries (p-comm) are diminutive or absent. ANTERIOR CIRCULATION: --Intracranial internal carotid arteries: Atherosclerotic calcification of the internal carotid arteries at the skull base without hemodynamically significant stenosis. --Anterior cerebral arteries (ACA): Normal. Hypoplastic right A1 segment, normal variant. --Middle cerebral arteries (MCA): Normal. VENOUS SINUSES: As permitted by contrast timing, patent. ANATOMIC VARIANTS: None Review of the MIP images confirms the above findings. IMPRESSION: 1. No emergent large vessel occlusion or high-grade stenosis. 2. Upper mediastinal lymphadenopathy, unchanged compared to 05/30/2015. 3. Moderate-to-severe spinal canal stenosis at C5-6 due to posterior longitudinal ligament calcification. Electronically Signed   By: Deatra Robinson M.D.   On: 12/07/2018 03:08   Dg Chest Port 1 View  Result Date: 12/07/2018 CLINICAL DATA:  Fever. EXAM: PORTABLE CHEST 1 VIEW COMPARISON:  CT chest and PA and lateral chest 05/29/2017. FINDINGS: The lungs are clear. There is cardiomegaly. No pneumothorax or pleural fluid. No acute or focal bony abnormality. IMPRESSION: Cardiomegaly without acute disease. Electronically Signed   By: Drusilla Kanner M.D.   On: 12/07/2018 19:55     The results of significant diagnostics from this hospitalization (including imaging, microbiology, ancillary and laboratory) are listed below for reference.     Microbiology: No results found for this or any previous visit (from the past 240 hour(s)).   Labs: BNP (last 3 results) No results for input(s): BNP in the last 8760 hours. Basic Metabolic Panel: No results for input(s): NA, K, CL, CO2, GLUCOSE, BUN, CREATININE, CALCIUM, MG, PHOS in the last 168 hours. Liver Function Tests: No results for input(s): AST, ALT, ALKPHOS, BILITOT, PROT, ALBUMIN in the last 168 hours. No results for input(s): LIPASE, AMYLASE in the last 168 hours. No results for input(s): AMMONIA in the  last 168 hours. CBC: No results for input(s): WBC, NEUTROABS, HGB, HCT, MCV, PLT in the last 168 hours. Cardiac Enzymes: No results for input(s): CKTOTAL, CKMB, CKMBINDEX, TROPONINI in the last 168 hours. BNP: Invalid input(s): POCBNP CBG: No results for input(s): GLUCAP in the last 168 hours. D-Dimer No results for input(s): DDIMER in the last 72 hours. Hgb A1c No results for input(s): HGBA1C  in the last 72 hours. Lipid Profile No results for input(s): CHOL, HDL, LDLCALC, TRIG, CHOLHDL, LDLDIRECT in the last 72 hours. Thyroid function studies No results for input(s): TSH, T4TOTAL, T3FREE, THYROIDAB in the last 72 hours.  Invalid input(s): FREET3 Anemia work up No results for input(s): VITAMINB12, FOLATE, FERRITIN, TIBC, IRON, RETICCTPCT in the last 72 hours. Urinalysis    Component Value Date/Time   COLORURINE YELLOW 12/10/2018 1432   APPEARANCEUR CLOUDY (A) 12/10/2018 1432   LABSPEC 1.023 12/10/2018 1432   PHURINE 5.0 12/10/2018 1432   GLUCOSEU 150 (A) 12/10/2018 1432   HGBUR LARGE (A) 12/10/2018 1432   BILIRUBINUR NEGATIVE 12/10/2018 1432   KETONESUR 5 (A) 12/10/2018 1432   PROTEINUR 30 (A) 12/10/2018 1432   UROBILINOGEN 0.2 05/31/2015 0045   NITRITE NEGATIVE 12/10/2018 1432   LEUKOCYTESUR NEGATIVE 12/10/2018 1432   Sepsis Labs Invalid input(s): PROCALCITONIN,  WBC,  LACTICIDVEN Microbiology No results found for this or any previous visit (from the past 240 hour(s)).   Time coordinating discharge in minutes: 65  SIGNED:   Calvert Cantor, MD  [facility] 12/20/2018, 8:27 PM Pager   If 7PM-7AM, please contact night-coverage [email or web address] Password [email or web address]

## 2018-12-13 NOTE — Care Management (Signed)
RNCM was notified that this patient was discharge last night after 8pm. Unfortunately, RNCM was not contacted. Notified Barbara Cower from [facility] of previous referral that was in place for home health services. WE do not have the ability to place new orders since the patient has been discharged.

## 2018-12-14 DIAGNOSIS — I10 Essential (primary) hypertension: Secondary | ICD-10-CM | POA: Diagnosis not present

## 2018-12-14 DIAGNOSIS — I272 Pulmonary hypertension, unspecified: Secondary | ICD-10-CM | POA: Diagnosis not present

## 2018-12-14 DIAGNOSIS — J690 Pneumonitis due to inhalation of food and vomit: Secondary | ICD-10-CM | POA: Diagnosis not present

## 2018-12-14 DIAGNOSIS — I2510 Atherosclerotic heart disease of native coronary artery without angina pectoris: Secondary | ICD-10-CM | POA: Diagnosis not present

## 2018-12-14 DIAGNOSIS — E119 Type 2 diabetes mellitus without complications: Secondary | ICD-10-CM | POA: Diagnosis not present

## 2018-12-15 ENCOUNTER — Telehealth: Payer: Self-pay | Admitting: Gastroenterology

## 2018-12-15 NOTE — Telephone Encounter (Signed)
Please arrange hospital follow-up in 4 weeks.

## 2018-12-17 DIAGNOSIS — Z7902 Long term (current) use of antithrombotics/antiplatelets: Secondary | ICD-10-CM | POA: Diagnosis not present

## 2018-12-17 DIAGNOSIS — F419 Anxiety disorder, unspecified: Secondary | ICD-10-CM | POA: Diagnosis not present

## 2018-12-17 DIAGNOSIS — G4733 Obstructive sleep apnea (adult) (pediatric): Secondary | ICD-10-CM | POA: Diagnosis not present

## 2018-12-17 DIAGNOSIS — M4802 Spinal stenosis, cervical region: Secondary | ICD-10-CM | POA: Diagnosis not present

## 2018-12-17 DIAGNOSIS — Z6837 Body mass index (BMI) 37.0-37.9, adult: Secondary | ICD-10-CM | POA: Diagnosis not present

## 2018-12-17 DIAGNOSIS — K219 Gastro-esophageal reflux disease without esophagitis: Secondary | ICD-10-CM | POA: Diagnosis not present

## 2018-12-17 DIAGNOSIS — I119 Hypertensive heart disease without heart failure: Secondary | ICD-10-CM | POA: Diagnosis not present

## 2018-12-17 DIAGNOSIS — I472 Ventricular tachycardia: Secondary | ICD-10-CM | POA: Diagnosis not present

## 2018-12-17 DIAGNOSIS — I272 Pulmonary hypertension, unspecified: Secondary | ICD-10-CM | POA: Diagnosis not present

## 2018-12-17 DIAGNOSIS — N179 Acute kidney failure, unspecified: Secondary | ICD-10-CM | POA: Diagnosis not present

## 2018-12-17 DIAGNOSIS — G9340 Encephalopathy, unspecified: Secondary | ICD-10-CM | POA: Diagnosis not present

## 2018-12-17 DIAGNOSIS — I6523 Occlusion and stenosis of bilateral carotid arteries: Secondary | ICD-10-CM | POA: Diagnosis not present

## 2018-12-17 DIAGNOSIS — Z7984 Long term (current) use of oral hypoglycemic drugs: Secondary | ICD-10-CM | POA: Diagnosis not present

## 2018-12-17 DIAGNOSIS — J690 Pneumonitis due to inhalation of food and vomit: Secondary | ICD-10-CM | POA: Diagnosis not present

## 2018-12-17 DIAGNOSIS — E785 Hyperlipidemia, unspecified: Secondary | ICD-10-CM | POA: Diagnosis not present

## 2018-12-17 DIAGNOSIS — R339 Retention of urine, unspecified: Secondary | ICD-10-CM | POA: Diagnosis not present

## 2018-12-17 DIAGNOSIS — I2510 Atherosclerotic heart disease of native coronary artery without angina pectoris: Secondary | ICD-10-CM | POA: Diagnosis not present

## 2018-12-17 DIAGNOSIS — I700 Atherosclerosis of aorta: Secondary | ICD-10-CM | POA: Diagnosis not present

## 2018-12-17 DIAGNOSIS — I771 Stricture of artery: Secondary | ICD-10-CM | POA: Diagnosis not present

## 2018-12-17 DIAGNOSIS — I6503 Occlusion and stenosis of bilateral vertebral arteries: Secondary | ICD-10-CM | POA: Diagnosis not present

## 2018-12-17 DIAGNOSIS — E876 Hypokalemia: Secondary | ICD-10-CM | POA: Diagnosis not present

## 2018-12-17 DIAGNOSIS — I081 Rheumatic disorders of both mitral and tricuspid valves: Secondary | ICD-10-CM | POA: Diagnosis not present

## 2018-12-17 DIAGNOSIS — E119 Type 2 diabetes mellitus without complications: Secondary | ICD-10-CM | POA: Diagnosis not present

## 2018-12-17 DIAGNOSIS — E2740 Unspecified adrenocortical insufficiency: Secondary | ICD-10-CM | POA: Diagnosis not present

## 2018-12-18 DIAGNOSIS — I10 Essential (primary) hypertension: Secondary | ICD-10-CM | POA: Diagnosis not present

## 2018-12-18 DIAGNOSIS — Z6833 Body mass index (BMI) 33.0-33.9, adult: Secondary | ICD-10-CM | POA: Diagnosis not present

## 2018-12-18 DIAGNOSIS — I169 Hypertensive crisis, unspecified: Secondary | ICD-10-CM | POA: Diagnosis not present

## 2018-12-18 DIAGNOSIS — I2510 Atherosclerotic heart disease of native coronary artery without angina pectoris: Secondary | ICD-10-CM | POA: Diagnosis not present

## 2018-12-18 DIAGNOSIS — N179 Acute kidney failure, unspecified: Secondary | ICD-10-CM | POA: Diagnosis not present

## 2018-12-18 DIAGNOSIS — I119 Hypertensive heart disease without heart failure: Secondary | ICD-10-CM | POA: Diagnosis not present

## 2018-12-18 DIAGNOSIS — E119 Type 2 diabetes mellitus without complications: Secondary | ICD-10-CM | POA: Diagnosis not present

## 2018-12-18 DIAGNOSIS — J9801 Acute bronchospasm: Secondary | ICD-10-CM | POA: Diagnosis not present

## 2018-12-18 DIAGNOSIS — J690 Pneumonitis due to inhalation of food and vomit: Secondary | ICD-10-CM | POA: Diagnosis not present

## 2018-12-18 DIAGNOSIS — G9340 Encephalopathy, unspecified: Secondary | ICD-10-CM | POA: Diagnosis not present

## 2018-12-19 DIAGNOSIS — I119 Hypertensive heart disease without heart failure: Secondary | ICD-10-CM | POA: Diagnosis not present

## 2018-12-19 DIAGNOSIS — N179 Acute kidney failure, unspecified: Secondary | ICD-10-CM | POA: Diagnosis not present

## 2018-12-19 DIAGNOSIS — I2510 Atherosclerotic heart disease of native coronary artery without angina pectoris: Secondary | ICD-10-CM | POA: Diagnosis not present

## 2018-12-19 DIAGNOSIS — J690 Pneumonitis due to inhalation of food and vomit: Secondary | ICD-10-CM | POA: Diagnosis not present

## 2018-12-19 DIAGNOSIS — G9340 Encephalopathy, unspecified: Secondary | ICD-10-CM | POA: Diagnosis not present

## 2018-12-19 DIAGNOSIS — E119 Type 2 diabetes mellitus without complications: Secondary | ICD-10-CM | POA: Diagnosis not present

## 2018-12-22 ENCOUNTER — Other Ambulatory Visit: Payer: Self-pay | Admitting: *Deleted

## 2018-12-22 DIAGNOSIS — I2510 Atherosclerotic heart disease of native coronary artery without angina pectoris: Secondary | ICD-10-CM | POA: Diagnosis not present

## 2018-12-22 DIAGNOSIS — E119 Type 2 diabetes mellitus without complications: Secondary | ICD-10-CM | POA: Diagnosis not present

## 2018-12-22 DIAGNOSIS — M7981 Nontraumatic hematoma of soft tissue: Secondary | ICD-10-CM | POA: Diagnosis not present

## 2018-12-22 DIAGNOSIS — Z6832 Body mass index (BMI) 32.0-32.9, adult: Secondary | ICD-10-CM | POA: Diagnosis not present

## 2018-12-22 DIAGNOSIS — I119 Hypertensive heart disease without heart failure: Secondary | ICD-10-CM | POA: Diagnosis not present

## 2018-12-22 DIAGNOSIS — E6609 Other obesity due to excess calories: Secondary | ICD-10-CM | POA: Diagnosis not present

## 2018-12-22 DIAGNOSIS — N179 Acute kidney failure, unspecified: Secondary | ICD-10-CM | POA: Diagnosis not present

## 2018-12-22 DIAGNOSIS — J690 Pneumonitis due to inhalation of food and vomit: Secondary | ICD-10-CM | POA: Diagnosis not present

## 2018-12-22 DIAGNOSIS — G9340 Encephalopathy, unspecified: Secondary | ICD-10-CM | POA: Diagnosis not present

## 2018-12-22 NOTE — Patient Outreach (Addendum)
[facility]) Care Management  12/22/2018  [name] Mueller 11-09-1934 [id number]   EMMI-general discharge from [place]  RED ON EMMI ALERT Day # 4 Date: Friday Dec 18 2018 1038 Red Alert Reason: Lost interest in things? Yes  Insurance: medicare [facility] admissions x 1 ED visits x 1 in the last 6 months  12/07/18 to 12/12/18 for hematemesis, hypertensive urgency, acute urinary retention, headache  Outreach attempt #1 Patient is able to verify HIPAA [facility] Care Management RN reviewed and addressed red alert with patient [name] Mueller gave [facility] RN CM permission to speak with grandson "[name] Mueller", [name] Mueller   There was noted difficulty with static in the home number's telephone and [name] Mueller requested a return call to his mobile number for better reception   He report they are getting ready to go to be seen by Dr Sherwood Gambler related to a possible hand wound infection related to a site [name] Foo pulled an IV out of while hospitalized at [facility] and a low cbg of 41. [name] Mueller reports working with [name] Feijoo to get the cbg up to 130 on last night 12/21/18    EMMI: [name] Reif did not know what CM was referring to when the EMMI question was reviewed She requested CM speak with her grandson "[name] Mueller" He reports the answer to the 12/18/18 EMMI question was incorrect related to poor phone reception. She does have depression and is on lexapro [name] Mueller did report concerns since discharge home. He reports possible hand wound infection and low cbgs. He also inquired about needed DME. He requests a possible return call after the MD appointment today   [facility] RN CM spoke with Aundra Millet at Dr Sherwood Gambler office  about the grandson's concerns with [name] Rodenbaugh's possible infected old IV area and need for assist with DME ( Glucometer and BP Cuff) Discussed [facility] possible referral    Social: [name] Milbauer  has her grandson, [place] "[employer] .  She needs assist with all her care needs and transportation to medical appointments from her  daughter and grandson.  Conditions: hematemesis, hypertensive urgency, acute urinary retention, headache,  OSA, hx of CAP, metabolic encephalopathy,  hypertension, hyperlipidemia, Diabetes type 2 (10/29/18 HgA1c 6.7), CAD, pulmonary hypertension, anemia, adrenal insufficiency, ventricular tachycardia, hx of syncope, morbid obesity, depression   DME:  Walker, shower chair, cane, w/c   Medications: denies concerns with taking medications as prescribed, affording medications, side effects of medications and questions about medications   Appointments:  12/22/18 2 pm Dr Sherwood Gambler    Advance Directives:  Has a HPOA, grandson    Consent: [facility] RN CM reviewed [facility] services with patient. Patient gave verbal consent for services [facility] telephonic RN CM.   Advised patient that there will be further automated EMMI- post discharge calls to assess how the patient is doing following the recent hospitalization Advised the patient that another call may be received from a nurse if any of their responses were abnormal. Patient voiced understanding and was appreciative of f/u call.   Plan: [facility] RN CM will follow up with patient and grandson within the next 4 business days   [facility] RN CM sent a successful outreach letter as discussed with [facility] brochure enclosed for review  Routed note to MD  Cala Bradford L. Noelle Penner, RN, BSN, CCM [facility] Telephonic Care Management Care Coordinator Office number [phone] Mobile number [phone]  Main [facility] number [phone] Fax number [phone]

## 2018-12-23 ENCOUNTER — Other Ambulatory Visit: Payer: Medicare Other | Admitting: *Deleted

## 2018-12-23 ENCOUNTER — Encounter: Payer: Self-pay | Admitting: Gastroenterology

## 2018-12-23 NOTE — Patient Outreach (Signed)
[facility]) Care Management  12/23/2018  [name] Mueller [birth year] [id number]    EMMI-general discharge from [place]  RED ON EMMI ALERT Day # 4 Date: Friday Dec 18 2018 1038 Red Alert Reason: Lost interest in things? Yes  Insurance: medicare [facility] admissions x 1 ED visits x 1 in the last 6 months  12/07/18 to 12/12/18 for hematemesis, hypertensive urgency, acute urinary retention, headache  Outreach attempt #2 unsuccessful  A female answered [name]'s mobile number and requests a return call at a later time as the family had and accident in the family today.  Cm offered empathy and informed caller to let [name] know Cm would return a call at a later time  The female states he will inform [name] and [name]   Social: [name]  has her grandson, [name] "[employer] .  She needs assist with all her care needs and transportation to medical appointments from her daughter and grandson.  Conditions: hematemesis, hypertensive urgency, acute urinary retention, headache,  OSA, hx of CAP, metabolic encephalopathy, hypertension, hyperlipidemia, Diabetes type 2 (10/29/18 HgA1c 6.7), CAD, pulmonary hypertension, anemia, adrenal insufficiency, ventricular tachycardia, hx of syncope, morbid obesity, depression   DME:  Walker, shower chair, cane, w/c   Appointments:  12/22/18 2 pm Dr Sherwood Gambler    Advance Directives:  Has a HPOA, grandson   plans  [facility] RN CM scheduled this [facility] engaged patient/patietn grandson for another call attempt within 4 business days     [name] L. Noelle Penner, RN, BSN, CCM [facility] Telephonic Care Management Care Coordinator Office number [phone] Mobile number [phone]  Main [facility] number [phone] Fax number [phone]

## 2018-12-23 NOTE — Telephone Encounter (Signed)
SCHEDULED AND LETTER SENT  °

## 2018-12-24 ENCOUNTER — Other Ambulatory Visit: Payer: Self-pay | Admitting: *Deleted

## 2018-12-24 ENCOUNTER — Other Ambulatory Visit: Payer: Self-pay

## 2018-12-24 DIAGNOSIS — E119 Type 2 diabetes mellitus without complications: Secondary | ICD-10-CM | POA: Diagnosis not present

## 2018-12-24 DIAGNOSIS — N179 Acute kidney failure, unspecified: Secondary | ICD-10-CM | POA: Diagnosis not present

## 2018-12-24 DIAGNOSIS — I119 Hypertensive heart disease without heart failure: Secondary | ICD-10-CM | POA: Diagnosis not present

## 2018-12-24 DIAGNOSIS — G9340 Encephalopathy, unspecified: Secondary | ICD-10-CM | POA: Diagnosis not present

## 2018-12-24 DIAGNOSIS — I2510 Atherosclerotic heart disease of native coronary artery without angina pectoris: Secondary | ICD-10-CM | POA: Diagnosis not present

## 2018-12-24 DIAGNOSIS — J690 Pneumonitis due to inhalation of food and vomit: Secondary | ICD-10-CM | POA: Diagnosis not present

## 2018-12-24 NOTE — Patient Outreach (Signed)
  [facility]) Care Management  12/24/2018  [name] Mueller 01/11/1935 [id number]   Follow up call for DMD, MD appointment and EMMI-general discharge from   RED ON EMMI ALERT Day # 4 Date: Friday Dec 18 2018 1038 Red Alert Reason: Lost interest in things? Yes  Insurance: medicare [facility] admissions x 1 ED visits x 1 in the last 6 months  12/07/18 to 12/12/18 for hematemesis, hypertensive urgency, acute urinary retention, headache  Patient is able to verify HIPAA Reviewed reason for follow up    Mrs Radillo reports she was seen by Dr Sherwood Gambler on 12/22/18 and she is doing better She confirms she did not leave with a cbg monitor. She gave permission for CM to speak with [name] He confirms she was ordered a cbg and BP monitors by Dr Sherwood Gambler and he has to go to pick them up   Voices no further medical concerns    Plans [facility] RN CM will close case at this time as patient has been assessed and no needs identified/needs resolved.   Pt encouraged to return a call to [facility] RN CM prn   Patriciaann Rabanal L. Noelle Penner, RN, BSN, CCM [facility] Telephonic Care Management Care Coordinator Office number [phone] Mobile number [phone]  Main [facility] number [phone] Fax number [phone]

## 2018-12-25 DIAGNOSIS — E119 Type 2 diabetes mellitus without complications: Secondary | ICD-10-CM | POA: Diagnosis not present

## 2018-12-25 DIAGNOSIS — J690 Pneumonitis due to inhalation of food and vomit: Secondary | ICD-10-CM | POA: Diagnosis not present

## 2018-12-25 DIAGNOSIS — I119 Hypertensive heart disease without heart failure: Secondary | ICD-10-CM | POA: Diagnosis not present

## 2018-12-25 DIAGNOSIS — N179 Acute kidney failure, unspecified: Secondary | ICD-10-CM | POA: Diagnosis not present

## 2018-12-25 DIAGNOSIS — G9340 Encephalopathy, unspecified: Secondary | ICD-10-CM | POA: Diagnosis not present

## 2018-12-25 DIAGNOSIS — I2510 Atherosclerotic heart disease of native coronary artery without angina pectoris: Secondary | ICD-10-CM | POA: Diagnosis not present

## 2018-12-29 DIAGNOSIS — J690 Pneumonitis due to inhalation of food and vomit: Secondary | ICD-10-CM | POA: Diagnosis not present

## 2018-12-29 DIAGNOSIS — N179 Acute kidney failure, unspecified: Secondary | ICD-10-CM | POA: Diagnosis not present

## 2018-12-29 DIAGNOSIS — I2510 Atherosclerotic heart disease of native coronary artery without angina pectoris: Secondary | ICD-10-CM | POA: Diagnosis not present

## 2018-12-29 DIAGNOSIS — G9340 Encephalopathy, unspecified: Secondary | ICD-10-CM | POA: Diagnosis not present

## 2018-12-29 DIAGNOSIS — E119 Type 2 diabetes mellitus without complications: Secondary | ICD-10-CM | POA: Diagnosis not present

## 2018-12-29 DIAGNOSIS — I119 Hypertensive heart disease without heart failure: Secondary | ICD-10-CM | POA: Diagnosis not present

## 2019-01-04 DIAGNOSIS — J690 Pneumonitis due to inhalation of food and vomit: Secondary | ICD-10-CM | POA: Diagnosis not present

## 2019-01-04 DIAGNOSIS — E119 Type 2 diabetes mellitus without complications: Secondary | ICD-10-CM | POA: Diagnosis not present

## 2019-01-04 DIAGNOSIS — I10 Essential (primary) hypertension: Secondary | ICD-10-CM | POA: Diagnosis not present

## 2019-01-04 DIAGNOSIS — I2510 Atherosclerotic heart disease of native coronary artery without angina pectoris: Secondary | ICD-10-CM | POA: Diagnosis not present

## 2019-01-05 DIAGNOSIS — I119 Hypertensive heart disease without heart failure: Secondary | ICD-10-CM | POA: Diagnosis not present

## 2019-01-05 DIAGNOSIS — J690 Pneumonitis due to inhalation of food and vomit: Secondary | ICD-10-CM | POA: Diagnosis not present

## 2019-01-05 DIAGNOSIS — G9340 Encephalopathy, unspecified: Secondary | ICD-10-CM | POA: Diagnosis not present

## 2019-01-05 DIAGNOSIS — I2510 Atherosclerotic heart disease of native coronary artery without angina pectoris: Secondary | ICD-10-CM | POA: Diagnosis not present

## 2019-01-05 DIAGNOSIS — E119 Type 2 diabetes mellitus without complications: Secondary | ICD-10-CM | POA: Diagnosis not present

## 2019-01-05 DIAGNOSIS — N179 Acute kidney failure, unspecified: Secondary | ICD-10-CM | POA: Diagnosis not present

## 2019-01-07 DIAGNOSIS — I2510 Atherosclerotic heart disease of native coronary artery without angina pectoris: Secondary | ICD-10-CM | POA: Diagnosis not present

## 2019-01-07 DIAGNOSIS — I119 Hypertensive heart disease without heart failure: Secondary | ICD-10-CM | POA: Diagnosis not present

## 2019-01-07 DIAGNOSIS — N179 Acute kidney failure, unspecified: Secondary | ICD-10-CM | POA: Diagnosis not present

## 2019-01-07 DIAGNOSIS — J690 Pneumonitis due to inhalation of food and vomit: Secondary | ICD-10-CM | POA: Diagnosis not present

## 2019-01-07 DIAGNOSIS — G9340 Encephalopathy, unspecified: Secondary | ICD-10-CM | POA: Diagnosis not present

## 2019-01-07 DIAGNOSIS — E119 Type 2 diabetes mellitus without complications: Secondary | ICD-10-CM | POA: Diagnosis not present

## 2019-01-11 DIAGNOSIS — G9340 Encephalopathy, unspecified: Secondary | ICD-10-CM | POA: Diagnosis not present

## 2019-01-11 DIAGNOSIS — I2510 Atherosclerotic heart disease of native coronary artery without angina pectoris: Secondary | ICD-10-CM | POA: Diagnosis not present

## 2019-01-11 DIAGNOSIS — J690 Pneumonitis due to inhalation of food and vomit: Secondary | ICD-10-CM | POA: Diagnosis not present

## 2019-01-11 DIAGNOSIS — E119 Type 2 diabetes mellitus without complications: Secondary | ICD-10-CM | POA: Diagnosis not present

## 2019-01-11 DIAGNOSIS — I119 Hypertensive heart disease without heart failure: Secondary | ICD-10-CM | POA: Diagnosis not present

## 2019-01-11 DIAGNOSIS — N179 Acute kidney failure, unspecified: Secondary | ICD-10-CM | POA: Diagnosis not present

## 2019-01-13 ENCOUNTER — Encounter: Payer: Self-pay | Admitting: Gastroenterology

## 2019-01-13 ENCOUNTER — Telehealth: Payer: Self-pay | Admitting: Gastroenterology

## 2019-01-13 ENCOUNTER — Ambulatory Visit: Payer: Medicare Other | Admitting: Gastroenterology

## 2019-01-13 NOTE — Telephone Encounter (Signed)
PATIENT WAS A NO SHOW AND LETTER SENT  °

## 2019-01-16 DIAGNOSIS — F419 Anxiety disorder, unspecified: Secondary | ICD-10-CM | POA: Diagnosis not present

## 2019-01-16 DIAGNOSIS — Z7984 Long term (current) use of oral hypoglycemic drugs: Secondary | ICD-10-CM | POA: Diagnosis not present

## 2019-01-16 DIAGNOSIS — N179 Acute kidney failure, unspecified: Secondary | ICD-10-CM | POA: Diagnosis not present

## 2019-01-16 DIAGNOSIS — Z6837 Body mass index (BMI) 37.0-37.9, adult: Secondary | ICD-10-CM | POA: Diagnosis not present

## 2019-01-16 DIAGNOSIS — R339 Retention of urine, unspecified: Secondary | ICD-10-CM | POA: Diagnosis not present

## 2019-01-16 DIAGNOSIS — I081 Rheumatic disorders of both mitral and tricuspid valves: Secondary | ICD-10-CM | POA: Diagnosis not present

## 2019-01-16 DIAGNOSIS — K219 Gastro-esophageal reflux disease without esophagitis: Secondary | ICD-10-CM | POA: Diagnosis not present

## 2019-01-16 DIAGNOSIS — I771 Stricture of artery: Secondary | ICD-10-CM | POA: Diagnosis not present

## 2019-01-16 DIAGNOSIS — I472 Ventricular tachycardia: Secondary | ICD-10-CM | POA: Diagnosis not present

## 2019-01-16 DIAGNOSIS — E785 Hyperlipidemia, unspecified: Secondary | ICD-10-CM | POA: Diagnosis not present

## 2019-01-16 DIAGNOSIS — I2510 Atherosclerotic heart disease of native coronary artery without angina pectoris: Secondary | ICD-10-CM | POA: Diagnosis not present

## 2019-01-16 DIAGNOSIS — I700 Atherosclerosis of aorta: Secondary | ICD-10-CM | POA: Diagnosis not present

## 2019-01-16 DIAGNOSIS — Z7902 Long term (current) use of antithrombotics/antiplatelets: Secondary | ICD-10-CM | POA: Diagnosis not present

## 2019-01-16 DIAGNOSIS — I6503 Occlusion and stenosis of bilateral vertebral arteries: Secondary | ICD-10-CM | POA: Diagnosis not present

## 2019-01-16 DIAGNOSIS — J690 Pneumonitis due to inhalation of food and vomit: Secondary | ICD-10-CM | POA: Diagnosis not present

## 2019-01-16 DIAGNOSIS — G9340 Encephalopathy, unspecified: Secondary | ICD-10-CM | POA: Diagnosis not present

## 2019-01-16 DIAGNOSIS — M4802 Spinal stenosis, cervical region: Secondary | ICD-10-CM | POA: Diagnosis not present

## 2019-01-16 DIAGNOSIS — E119 Type 2 diabetes mellitus without complications: Secondary | ICD-10-CM | POA: Diagnosis not present

## 2019-01-16 DIAGNOSIS — E2740 Unspecified adrenocortical insufficiency: Secondary | ICD-10-CM | POA: Diagnosis not present

## 2019-01-16 DIAGNOSIS — I6523 Occlusion and stenosis of bilateral carotid arteries: Secondary | ICD-10-CM | POA: Diagnosis not present

## 2019-01-16 DIAGNOSIS — E876 Hypokalemia: Secondary | ICD-10-CM | POA: Diagnosis not present

## 2019-01-16 DIAGNOSIS — I272 Pulmonary hypertension, unspecified: Secondary | ICD-10-CM | POA: Diagnosis not present

## 2019-01-16 DIAGNOSIS — G4733 Obstructive sleep apnea (adult) (pediatric): Secondary | ICD-10-CM | POA: Diagnosis not present

## 2019-01-16 DIAGNOSIS — I119 Hypertensive heart disease without heart failure: Secondary | ICD-10-CM | POA: Diagnosis not present

## 2019-01-19 DIAGNOSIS — E119 Type 2 diabetes mellitus without complications: Secondary | ICD-10-CM | POA: Diagnosis not present

## 2019-01-19 DIAGNOSIS — J690 Pneumonitis due to inhalation of food and vomit: Secondary | ICD-10-CM | POA: Diagnosis not present

## 2019-01-19 DIAGNOSIS — I2510 Atherosclerotic heart disease of native coronary artery without angina pectoris: Secondary | ICD-10-CM | POA: Diagnosis not present

## 2019-01-19 DIAGNOSIS — N179 Acute kidney failure, unspecified: Secondary | ICD-10-CM | POA: Diagnosis not present

## 2019-01-19 DIAGNOSIS — I119 Hypertensive heart disease without heart failure: Secondary | ICD-10-CM | POA: Diagnosis not present

## 2019-01-19 DIAGNOSIS — G9340 Encephalopathy, unspecified: Secondary | ICD-10-CM | POA: Diagnosis not present

## 2019-01-26 DIAGNOSIS — I10 Essential (primary) hypertension: Secondary | ICD-10-CM | POA: Diagnosis not present

## 2019-01-26 DIAGNOSIS — N183 Chronic kidney disease, stage 3 (moderate): Secondary | ICD-10-CM | POA: Diagnosis not present

## 2019-01-26 DIAGNOSIS — E1129 Type 2 diabetes mellitus with other diabetic kidney complication: Secondary | ICD-10-CM | POA: Diagnosis not present

## 2019-01-26 DIAGNOSIS — E782 Mixed hyperlipidemia: Secondary | ICD-10-CM | POA: Diagnosis not present

## 2019-02-26 ENCOUNTER — Other Ambulatory Visit: Payer: Self-pay

## 2019-04-19 DIAGNOSIS — R4020 Unspecified coma: Secondary | ICD-10-CM | POA: Diagnosis not present

## 2019-04-29 DIAGNOSIS — 419620001 Death: Secondary | SNOMED CT | POA: Diagnosis not present

## 2019-04-29 DEATH — deceased
